# Patient Record
Sex: Female | Born: 1940 | Race: White | Hispanic: No | Smoking: Never smoker
Health system: Southern US, Community
[De-identification: ages and names within clinical notes are randomized; demographics above are authoritative.]

## PROBLEM LIST (undated history)

## (undated) DIAGNOSIS — M1990 Unspecified osteoarthritis, unspecified site: Secondary | ICD-10-CM

## (undated) DIAGNOSIS — H9190 Unspecified hearing loss, unspecified ear: Secondary | ICD-10-CM

## (undated) DIAGNOSIS — I350 Nonrheumatic aortic (valve) stenosis: Secondary | ICD-10-CM

## (undated) DIAGNOSIS — K219 Gastro-esophageal reflux disease without esophagitis: Secondary | ICD-10-CM

## (undated) DIAGNOSIS — I2510 Atherosclerotic heart disease of native coronary artery without angina pectoris: Secondary | ICD-10-CM

## (undated) DIAGNOSIS — E7800 Pure hypercholesterolemia, unspecified: Secondary | ICD-10-CM

## (undated) DIAGNOSIS — I10 Essential (primary) hypertension: Secondary | ICD-10-CM

## (undated) HISTORY — DX: Atherosclerotic heart disease of native coronary artery without angina pectoris: I25.10

## (undated) HISTORY — DX: Unspecified osteoarthritis, unspecified site: M19.90

## (undated) HISTORY — PX: BLADDER SURGERY: SHX569

## (undated) HISTORY — DX: Nonrheumatic aortic (valve) stenosis: I35.0

## (undated) HISTORY — DX: Gastro-esophageal reflux disease without esophagitis: K21.9

## (undated) HISTORY — DX: Unspecified hearing loss, unspecified ear: H91.90

## (undated) HISTORY — PX: CARDIAC SURGERY: SHX584

---

## 1997-08-27 ENCOUNTER — Other Ambulatory Visit: Admission: RE | Admit: 1997-08-27 | Discharge: 1997-08-27 | Payer: Self-pay | Admitting: Obstetrics & Gynecology

## 1998-03-19 ENCOUNTER — Emergency Department (HOSPITAL_COMMUNITY): Admission: EM | Admit: 1998-03-19 | Discharge: 1998-03-19 | Payer: Self-pay | Admitting: Emergency Medicine

## 1998-09-19 ENCOUNTER — Other Ambulatory Visit: Admission: RE | Admit: 1998-09-19 | Discharge: 1998-09-19 | Payer: Self-pay | Admitting: Family Medicine

## 1999-03-05 ENCOUNTER — Encounter: Admission: RE | Admit: 1999-03-05 | Discharge: 1999-03-05 | Payer: Self-pay | Admitting: Internal Medicine

## 1999-03-05 ENCOUNTER — Encounter: Payer: Self-pay | Admitting: Family Medicine

## 1999-03-12 ENCOUNTER — Encounter: Admission: RE | Admit: 1999-03-12 | Discharge: 1999-03-12 | Payer: Self-pay | Admitting: Family Medicine

## 1999-03-12 ENCOUNTER — Encounter: Payer: Self-pay | Admitting: Family Medicine

## 1999-09-10 ENCOUNTER — Encounter: Payer: Self-pay | Admitting: Family Medicine

## 1999-09-10 ENCOUNTER — Encounter: Admission: RE | Admit: 1999-09-10 | Discharge: 1999-09-10 | Payer: Self-pay | Admitting: Family Medicine

## 1999-10-01 ENCOUNTER — Emergency Department (HOSPITAL_COMMUNITY): Admission: EM | Admit: 1999-10-01 | Discharge: 1999-10-01 | Payer: Self-pay | Admitting: Emergency Medicine

## 2000-05-12 ENCOUNTER — Encounter: Admission: RE | Admit: 2000-05-12 | Discharge: 2000-05-12 | Payer: Self-pay | Admitting: Family Medicine

## 2000-05-12 ENCOUNTER — Encounter: Payer: Self-pay | Admitting: Family Medicine

## 2000-05-21 ENCOUNTER — Ambulatory Visit (HOSPITAL_BASED_OUTPATIENT_CLINIC_OR_DEPARTMENT_OTHER): Admission: RE | Admit: 2000-05-21 | Discharge: 2000-05-21 | Payer: Self-pay | Admitting: Surgery

## 2000-05-21 ENCOUNTER — Encounter (INDEPENDENT_AMBULATORY_CARE_PROVIDER_SITE_OTHER): Payer: Self-pay | Admitting: *Deleted

## 2000-09-17 ENCOUNTER — Other Ambulatory Visit: Admission: RE | Admit: 2000-09-17 | Discharge: 2000-09-17 | Payer: Self-pay | Admitting: Family Medicine

## 2000-12-03 ENCOUNTER — Encounter (INDEPENDENT_AMBULATORY_CARE_PROVIDER_SITE_OTHER): Payer: Self-pay | Admitting: *Deleted

## 2000-12-03 ENCOUNTER — Ambulatory Visit (HOSPITAL_COMMUNITY): Admission: RE | Admit: 2000-12-03 | Discharge: 2000-12-03 | Payer: Self-pay | Admitting: *Deleted

## 2001-06-27 ENCOUNTER — Encounter: Payer: Self-pay | Admitting: Family Medicine

## 2001-06-27 ENCOUNTER — Encounter: Admission: RE | Admit: 2001-06-27 | Discharge: 2001-06-27 | Payer: Self-pay | Admitting: Family Medicine

## 2001-11-24 ENCOUNTER — Ambulatory Visit (HOSPITAL_COMMUNITY): Admission: RE | Admit: 2001-11-24 | Discharge: 2001-11-24 | Payer: Self-pay | Admitting: Family Medicine

## 2001-11-24 ENCOUNTER — Encounter: Payer: Self-pay | Admitting: Family Medicine

## 2003-01-10 ENCOUNTER — Encounter: Admission: RE | Admit: 2003-01-10 | Discharge: 2003-01-10 | Payer: Self-pay | Admitting: Family Medicine

## 2003-01-10 ENCOUNTER — Encounter: Payer: Self-pay | Admitting: Family Medicine

## 2003-11-13 ENCOUNTER — Other Ambulatory Visit: Admission: RE | Admit: 2003-11-13 | Discharge: 2003-11-13 | Payer: Self-pay | Admitting: Family Medicine

## 2004-01-21 ENCOUNTER — Encounter: Admission: RE | Admit: 2004-01-21 | Discharge: 2004-01-21 | Payer: Self-pay | Admitting: Family Medicine

## 2004-07-16 ENCOUNTER — Encounter: Admission: RE | Admit: 2004-07-16 | Discharge: 2004-07-16 | Payer: Self-pay | Admitting: Family Medicine

## 2004-12-29 ENCOUNTER — Encounter: Admission: RE | Admit: 2004-12-29 | Discharge: 2004-12-29 | Payer: Self-pay | Admitting: *Deleted

## 2005-01-02 ENCOUNTER — Ambulatory Visit (HOSPITAL_COMMUNITY): Admission: RE | Admit: 2005-01-02 | Discharge: 2005-01-03 | Payer: Self-pay | Admitting: Interventional Cardiology

## 2005-11-25 ENCOUNTER — Encounter: Admission: RE | Admit: 2005-11-25 | Discharge: 2005-11-25 | Payer: Self-pay | Admitting: Family Medicine

## 2006-02-24 ENCOUNTER — Other Ambulatory Visit: Admission: RE | Admit: 2006-02-24 | Discharge: 2006-02-24 | Payer: Self-pay | Admitting: Family Medicine

## 2006-11-29 ENCOUNTER — Encounter: Admission: RE | Admit: 2006-11-29 | Discharge: 2006-11-29 | Payer: Self-pay | Admitting: Family Medicine

## 2007-12-26 ENCOUNTER — Encounter: Admission: RE | Admit: 2007-12-26 | Discharge: 2007-12-26 | Payer: Self-pay | Admitting: Family Medicine

## 2007-12-28 ENCOUNTER — Other Ambulatory Visit: Admission: RE | Admit: 2007-12-28 | Discharge: 2007-12-28 | Payer: Self-pay | Admitting: Family Medicine

## 2009-01-02 ENCOUNTER — Encounter: Admission: RE | Admit: 2009-01-02 | Discharge: 2009-01-02 | Payer: Self-pay | Admitting: Family Medicine

## 2009-01-23 ENCOUNTER — Other Ambulatory Visit: Admission: RE | Admit: 2009-01-23 | Discharge: 2009-01-23 | Payer: Self-pay | Admitting: Family Medicine

## 2010-01-22 ENCOUNTER — Encounter: Admission: RE | Admit: 2010-01-22 | Discharge: 2010-01-22 | Payer: Self-pay | Admitting: Family Medicine

## 2010-08-08 NOTE — Cardiovascular Report (Signed)
NAME:  Susan Marsh, Susan Marsh NO.:  [id number]   MEDICAL RECORD NO.:  [id number]          PATIENT TYPE:  OIB   LOCATION:  2856                         FACILITY:  [facility]   PHYSICIAN:  Lyn Records, M.D.   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   PROCEDURE:  Direct stent, proximal left anterior descending coronary artery,  with Cypher drug-eluting stent.   OPERATORS:  Lyn Records, M.D., Corky Crafts, M.D.   DESCRIPTION:  A 6-French sheath was placed in the right femoral artery using  the modified Seldinger technique.  A 6-French #4 left Judkins catheter was  used for left coronary artery intubation.  IV Angiomax was used as an  antithrombotic.  The patient did receive a total of 600 mg of Plavix in the  past 24 hours.   A BMW wire was used to cross beyond the tight stenosis in the proximal LAD.  Direct stenting with a 3.0 x 13 Cypher drug-eluting stent was then  performed.  Deployment was to 15 atmospheres and post dilatation was with a  PowerSail 3.25 x 8 mm balloon.  Two balloon inflations were performed in the  distal and proximal stent successively, starting distally and going  proximally.  TIMI grade 3 flow was noted.  Stenosis was felt to be  adequately dilated and the case was terminated.  She was not a candidate for  Angio-Seal.   CONCLUSION:  Successful percutaneous coronary intervention of the proximal  left anterior descending coronary artery with reduction in stenosis from 80  to 0% following Cypher drug-eluting stent implantation as described above.   PLAN:  Aspirin and Plavix x12 months.  Hopeful discharge January 03, 2005.      Lyn Records, M.D.  Electronically Signed     HWS/MEDQ  D:  01/02/2005  T:  01/03/2005  Job:   [id number]   cc:   Holley Bouche, M.D.  Fax: [phone]   Meade Maw, M.D.  Fax: [phone]

## 2010-08-08 NOTE — Op Note (Signed)
[place]  Patient:    Susan Marsh, Susan Marsh                      MRN: [id number] Proc. Date: 05/21/00 Adm. Date:  [id number] Attending:  Katha Cabal                           Operative Report  CCS [phone].  PREOPERATIVE INDICATIONS:  Susan Marsh has had a longstanding nipple discharge on the left side, recently becoming bloody.  She had duct cannulation, which appeared to identify a filling defect in the inferior aspect of the left breast.  Informed consent was obtained regarding removal.  SURGEON:  Thornton Park. Daphine Deutscher, M.D.  ANESTHESIA:  General by LMA.  DESCRIPTION OF PROCEDURE:  Susan Marsh was taken to room 6 on March 1 and given general anesthesia.  The nipple was first inspected and compressed and the uniductal discharge noted.  I passed a tiny little cannula into that duct after prepping the area widely with Betadine.  With the ductal probe in place, I then made a circumareolar incision around the inferior margin of the nipple and stayed along the areola up and then excised this duct with the underlying tissue.  I went out about at least 2-3 cm into the inferior aspect of the breast and removed the breast tissue.  There was not a single duct that I identified that was dark or showed blood within it.  This corresponds to the fact that I had not seen any blood from her nipple at all, as previously reported.  I, however, took a generous biopsy in the inferior aspect both medially and laterally and removed anything that felt palpable as well as the duct that I had cannulated.  Bleeding was controlled with figure-of-eight sutures of 4-0 Vicryl and the electrocautery.  The area was then infiltrated with 1% lidocaine and was closed with 4-0 Vicryl subcuticularly with benzoin and Steri-Strips.  A sterile dressing was applied.  The patient will be given Maxidone for pain and will be followed up in the office in about two  weeks.DD: 05/21/00 TD:  05/22/00 Job: [id number]

## 2011-01-23 ENCOUNTER — Other Ambulatory Visit: Payer: Self-pay | Admitting: Family Medicine

## 2011-01-23 DIAGNOSIS — Z1231 Encounter for screening mammogram for malignant neoplasm of breast: Secondary | ICD-10-CM

## 2011-01-28 ENCOUNTER — Ambulatory Visit
Admission: RE | Admit: 2011-01-28 | Discharge: 2011-01-28 | Disposition: A | Discharge status: Home / Self Care | Payer: Medicare HMO | Source: Ambulatory Visit | Attending: Family Medicine | Admitting: Family Medicine

## 2011-01-28 DIAGNOSIS — Z1231 Encounter for screening mammogram for malignant neoplasm of breast: Secondary | ICD-10-CM

## 2012-01-20 ENCOUNTER — Other Ambulatory Visit: Payer: Self-pay | Admitting: Family Medicine

## 2012-01-20 DIAGNOSIS — Z1231 Encounter for screening mammogram for malignant neoplasm of breast: Secondary | ICD-10-CM

## 2012-01-29 ENCOUNTER — Ambulatory Visit
Admission: RE | Admit: 2012-01-29 | Discharge: 2012-01-29 | Disposition: A | Discharge status: Home / Self Care | Payer: Medicare HMO | Source: Ambulatory Visit | Attending: Family Medicine | Admitting: Family Medicine

## 2012-01-29 DIAGNOSIS — Z1231 Encounter for screening mammogram for malignant neoplasm of breast: Secondary | ICD-10-CM

## 2012-02-05 ENCOUNTER — Other Ambulatory Visit: Payer: Self-pay | Admitting: Family Medicine

## 2012-02-05 DIAGNOSIS — Z780 Asymptomatic menopausal state: Secondary | ICD-10-CM

## 2012-02-12 ENCOUNTER — Ambulatory Visit
Admission: RE | Admit: 2012-02-12 | Discharge: 2012-02-12 | Disposition: A | Discharge status: Home / Self Care | Payer: Medicare HMO | Source: Ambulatory Visit | Attending: Family Medicine | Admitting: Family Medicine

## 2012-02-12 DIAGNOSIS — Z780 Asymptomatic menopausal state: Secondary | ICD-10-CM

## 2013-01-18 ENCOUNTER — Other Ambulatory Visit: Payer: Self-pay

## 2013-01-18 DIAGNOSIS — Z1231 Encounter for screening mammogram for malignant neoplasm of breast: Secondary | ICD-10-CM

## 2013-02-22 ENCOUNTER — Ambulatory Visit: Payer: Medicare HMO

## 2013-03-01 ENCOUNTER — Ambulatory Visit
Admission: RE | Admit: 2013-03-01 | Discharge: 2013-03-01 | Disposition: A | Discharge status: Home / Self Care | Payer: Medicare PPO | Source: Ambulatory Visit

## 2013-03-01 DIAGNOSIS — Z1231 Encounter for screening mammogram for malignant neoplasm of breast: Secondary | ICD-10-CM

## 2013-08-23 ENCOUNTER — Ambulatory Visit
Admission: RE | Admit: 2013-08-23 | Discharge: 2013-08-23 | Disposition: A | Discharge status: Home / Self Care | Payer: Medicare PPO | Source: Ambulatory Visit | Attending: Family Medicine | Admitting: Family Medicine

## 2013-08-23 ENCOUNTER — Other Ambulatory Visit: Payer: Self-pay | Admitting: Family Medicine

## 2013-08-23 DIAGNOSIS — N644 Mastodynia: Secondary | ICD-10-CM

## 2014-02-07 ENCOUNTER — Other Ambulatory Visit: Payer: Self-pay

## 2014-02-07 DIAGNOSIS — Z1231 Encounter for screening mammogram for malignant neoplasm of breast: Secondary | ICD-10-CM

## 2014-03-02 ENCOUNTER — Encounter (INDEPENDENT_AMBULATORY_CARE_PROVIDER_SITE_OTHER): Payer: Self-pay

## 2014-03-02 ENCOUNTER — Ambulatory Visit
Admission: RE | Admit: 2014-03-02 | Discharge: 2014-03-02 | Disposition: A | Discharge status: Home / Self Care | Payer: Commercial Managed Care - HMO | Source: Ambulatory Visit

## 2014-03-02 DIAGNOSIS — Z1231 Encounter for screening mammogram for malignant neoplasm of breast: Secondary | ICD-10-CM

## 2014-05-25 DIAGNOSIS — K644 Residual hemorrhoidal skin tags: Secondary | ICD-10-CM | POA: Diagnosis not present

## 2014-05-25 DIAGNOSIS — K641 Second degree hemorrhoids: Secondary | ICD-10-CM | POA: Diagnosis not present

## 2014-05-25 DIAGNOSIS — Z1211 Encounter for screening for malignant neoplasm of colon: Secondary | ICD-10-CM | POA: Diagnosis not present

## 2014-05-25 DIAGNOSIS — Z800 Family history of malignant neoplasm of digestive organs: Secondary | ICD-10-CM | POA: Diagnosis not present

## 2014-05-25 DIAGNOSIS — K5730 Diverticulosis of large intestine without perforation or abscess without bleeding: Secondary | ICD-10-CM | POA: Diagnosis not present

## 2014-09-14 DIAGNOSIS — E780 Pure hypercholesterolemia: Secondary | ICD-10-CM | POA: Diagnosis not present

## 2014-09-14 DIAGNOSIS — M1990 Unspecified osteoarthritis, unspecified site: Secondary | ICD-10-CM | POA: Diagnosis not present

## 2014-09-14 DIAGNOSIS — I10 Essential (primary) hypertension: Secondary | ICD-10-CM | POA: Diagnosis not present

## 2015-01-30 ENCOUNTER — Other Ambulatory Visit: Payer: Self-pay

## 2015-01-30 DIAGNOSIS — Z1231 Encounter for screening mammogram for malignant neoplasm of breast: Secondary | ICD-10-CM

## 2015-02-14 DIAGNOSIS — K219 Gastro-esophageal reflux disease without esophagitis: Secondary | ICD-10-CM | POA: Diagnosis not present

## 2015-02-14 DIAGNOSIS — R1084 Generalized abdominal pain: Secondary | ICD-10-CM | POA: Diagnosis not present

## 2015-02-14 DIAGNOSIS — I10 Essential (primary) hypertension: Secondary | ICD-10-CM | POA: Diagnosis not present

## 2015-02-16 DIAGNOSIS — K219 Gastro-esophageal reflux disease without esophagitis: Secondary | ICD-10-CM | POA: Diagnosis not present

## 2015-02-16 DIAGNOSIS — R450 Nervousness: Secondary | ICD-10-CM | POA: Diagnosis not present

## 2015-02-18 ENCOUNTER — Encounter (HOSPITAL_COMMUNITY): Payer: Self-pay | Admitting: Family Medicine

## 2015-02-18 ENCOUNTER — Emergency Department (HOSPITAL_COMMUNITY)
Admission: EM | Admit: 2015-02-18 | Discharge: 2015-02-19 | Disposition: A | Discharge status: Home / Self Care | Payer: Commercial Managed Care - HMO | Attending: Emergency Medicine | Admitting: Emergency Medicine

## 2015-02-18 ENCOUNTER — Emergency Department (HOSPITAL_COMMUNITY): Payer: Commercial Managed Care - HMO

## 2015-02-18 DIAGNOSIS — Z791 Long term (current) use of non-steroidal anti-inflammatories (NSAID): Secondary | ICD-10-CM | POA: Diagnosis not present

## 2015-02-18 DIAGNOSIS — Z7982 Long term (current) use of aspirin: Secondary | ICD-10-CM | POA: Diagnosis not present

## 2015-02-18 DIAGNOSIS — R079 Chest pain, unspecified: Secondary | ICD-10-CM | POA: Diagnosis present

## 2015-02-18 DIAGNOSIS — Z79899 Other long term (current) drug therapy: Secondary | ICD-10-CM | POA: Diagnosis not present

## 2015-02-18 DIAGNOSIS — E7800 Pure hypercholesterolemia, unspecified: Secondary | ICD-10-CM | POA: Insufficient documentation

## 2015-02-18 DIAGNOSIS — R0789 Other chest pain: Secondary | ICD-10-CM | POA: Diagnosis not present

## 2015-02-18 DIAGNOSIS — K209 Esophagitis, unspecified without bleeding: Secondary | ICD-10-CM

## 2015-02-18 DIAGNOSIS — I10 Essential (primary) hypertension: Secondary | ICD-10-CM | POA: Insufficient documentation

## 2015-02-18 HISTORY — DX: Essential (primary) hypertension: I10

## 2015-02-18 HISTORY — DX: Pure hypercholesterolemia, unspecified: E78.00

## 2015-02-18 LAB — COMPREHENSIVE METABOLIC PANEL
ALBUMIN: 4.7 g/dL (ref 3.5–5.0)
ALT: 35 U/L (ref 14–54)
AST: 43 U/L — AB (ref 15–41)
Alkaline Phosphatase: 66 U/L (ref 38–126)
Anion gap: 12 (ref 5–15)
BUN: 12 mg/dL (ref 6–20)
CHLORIDE: 90 mmol/L — AB (ref 101–111)
CO2: 26 mmol/L (ref 22–32)
Calcium: 9.7 mg/dL (ref 8.9–10.3)
Creatinine, Ser: 0.66 mg/dL (ref 0.44–1.00)
GFR calc Af Amer: >60 mL/min (ref >60)
Glucose, Bld: 104 mg/dL — ABNORMAL HIGH (ref 65–99)
POTASSIUM: 2.9 mmol/L — AB (ref 3.5–5.1)
SODIUM: 128 mmol/L — AB (ref 135–145)
Total Bilirubin: 0.6 mg/dL (ref 0.3–1.2)
Total Protein: 8.3 g/dL — ABNORMAL HIGH (ref 6.5–8.1)

## 2015-02-18 LAB — URINE MICROSCOPIC-ADD ON: BACTERIA UA: NONE SEEN

## 2015-02-18 LAB — URINALYSIS, ROUTINE W REFLEX MICROSCOPIC
Bilirubin Urine: NEGATIVE
GLUCOSE, UA: NEGATIVE mg/dL
HGB URINE DIPSTICK: NEGATIVE
KETONES UR: NEGATIVE mg/dL
Nitrite: NEGATIVE
PH: 7 (ref 5.0–8.0)
Protein, ur: NEGATIVE mg/dL
Specific Gravity, Urine: 1.012 (ref 1.005–1.030)

## 2015-02-18 LAB — CBC
HEMATOCRIT: 40.8 % (ref 36.0–46.0)
Hemoglobin: 14.7 g/dL (ref 12.0–15.0)
MCH: 30.9 pg (ref 26.0–34.0)
MCHC: 36 g/dL (ref 30.0–36.0)
MCV: 85.9 fL (ref 78.0–100.0)
Platelets: 256 10*3/uL (ref 150–400)
RBC: 4.75 MIL/uL (ref 3.87–5.11)
RDW: 12.3 % (ref 11.5–15.5)
WBC: 6.8 10*3/uL (ref 4.0–10.5)

## 2015-02-18 LAB — I-STAT TROPONIN, ED: Troponin i, poc: 0.01 ng/mL (ref 0.00–0.08)

## 2015-02-18 LAB — LIPASE, BLOOD: LIPASE: 57 U/L — AB (ref 11–51)

## 2015-02-18 MED ORDER — POTASSIUM CHLORIDE CRYS ER 20 MEQ PO TBCR
40.0000 meq | EXTENDED_RELEASE_TABLET | Freq: Once | ORAL | Status: AC
  Administered 2015-02-18: 40 meq via ORAL
  Filled 2015-02-18: qty 2

## 2015-02-18 MED ORDER — SODIUM CHLORIDE 0.9 % IV BOLUS (SEPSIS)
500.0000 mL | Freq: Once | INTRAVENOUS | Status: AC
  Administered 2015-02-18: 500 mL via INTRAVENOUS

## 2015-02-18 MED ORDER — SODIUM CHLORIDE 0.9 % IV BOLUS (SEPSIS): 1000.0000 mL | Freq: Once | INTRAVENOUS | Status: DC

## 2015-02-18 NOTE — ED Notes (Signed)
Pt here for chest pain and the sensation of something stuck in esophagus. Pt has lost 10 pounds and unable to hold anything down.

## 2015-02-18 NOTE — ED Provider Notes (Signed)
CSN: [id number]     Arrival date & time 02/18/15  1757 History   First MD Initiated Contact with Patient 02/18/15 2005     Chief Complaint  Patient presents with  . Chest Pain  . Emesis   HPI  Susan Marsh is a 74 y.o. F PMH significant for HTN, HLD presenting with a two week history of chest pain and sensation of something being stuck in her esophagus. She is from out of town, staying with family for Thanksgiving, and was seen at an urgent care in [place] for the same complaint two days ago and was started on ranitidine. She describes her pain as 3/10 pain scale, discomfort, burning, associated with foods, non-radiating, lower esophagus. She endorses a 10 lb weight loss over the 2 weeks because the pain bothers her and she spits the food back up. She denies fevers, chills, SOB, palpitations, abdominal pain, changes in bowel/bladder habits.  Past Medical History  Diagnosis Date  . Hypertension   . High cholesterol    Past Surgical History  Procedure Laterality Date  . Cardiac surgery     History reviewed. No pertinent family history. Social History  Substance Use Topics  . Smoking status: Never Smoker   . Smokeless tobacco: None  . Alcohol Use: None   OB History    No data available     Review of Systems  Ten systems are reviewed and are negative for acute change except as noted in the HPI  Allergies  Review of patient's allergies indicates no known allergies.  Home Medications   Prior to Admission medications   Medication Sig Start Date End Date Taking? Authorizing Provider  amLODipine (NORVASC) 10 MG tablet Take 10 mg by mouth daily. 02/10/15  Yes Historical Provider, MD  aspirin 81 MG chewable tablet Chew 81 mg by mouth daily.   Yes Historical Provider, MD  hydrochlorothiazide (HYDRODIURIL) 25 MG tablet Take 25 mg by mouth daily. 02/10/15  Yes Historical Provider, MD  lisinopril (PRINIVIL,ZESTRIL) 40 MG tablet Take 40 mg by mouth daily. 02/10/15  Yes Historical  Provider, MD  LORazepam (ATIVAN) 0.5 MG tablet Take 0.5 mg by mouth 2 (two) times daily as needed. nervousness 02/16/15  Yes Historical Provider, MD  meloxicam (MOBIC) 15 MG tablet Take 15 mg by mouth daily. 02/10/15  Yes Historical Provider, MD  metoprolol (LOPRESSOR) 50 MG tablet Take 50 mg by mouth 2 (two) times daily. 02/10/15  Yes Historical Provider, MD  pravastatin (PRAVACHOL) 40 MG tablet Take 40 mg by mouth daily. 02/10/15  Yes Historical Provider, MD  ranitidine (ZANTAC) 150 MG tablet Take 150 mg by mouth 2 (two) times daily as needed. reflux 02/16/15  Yes Historical Provider, MD   BP 128/62 mmHg  Pulse 57  Temp(Src) 98.3 F (36.8 C) (Oral)  Resp 15  Wt 68.493 kg  SpO2 96% Physical Exam  Constitutional: She appears well-developed and well-nourished. No distress.  HENT:  Head: Normocephalic and atraumatic.  Mouth/Throat: Oropharynx is clear and moist. No oropharyngeal exudate.  Eyes: Conjunctivae are normal. Pupils are equal, round, and reactive to light. Right eye exhibits no discharge. Left eye exhibits no discharge. No scleral icterus.  Neck: No tracheal deviation present.  Cardiovascular: Normal rate, regular rhythm, normal heart sounds and intact distal pulses.  Exam reveals no gallop and no friction rub.   No murmur heard. Pulmonary/Chest: Effort normal and breath sounds normal. No respiratory distress. She has no wheezes. She has no rales. She exhibits no tenderness.  Abdominal:  Soft. Bowel sounds are normal. She exhibits no distension and no mass. There is no tenderness. There is no rebound and no guarding.  Musculoskeletal: She exhibits no edema.  Lymphadenopathy:    She has no cervical adenopathy.  Neurological: She is alert. Coordination normal.  Skin: Skin is warm and dry. No rash noted. She is not diaphoretic. No erythema.  Psychiatric: She has a normal mood and affect. Her behavior is normal.  Nursing note and vitals reviewed.   ED Course  Procedures  Labs  Review Labs Reviewed  LIPASE, BLOOD - Abnormal; Notable for the following:    Lipase 57 (*)    All other components within normal limits  COMPREHENSIVE METABOLIC PANEL - Abnormal; Notable for the following:    Sodium 128 (*)    Potassium 2.9 (*)    Chloride 90 (*)    Glucose, Bld 104 (*)    Total Protein 8.3 (*)    AST 43 (*)    All other components within normal limits  URINALYSIS, ROUTINE W REFLEX MICROSCOPIC (NOT AT [facility]) - Abnormal; Notable for the following:    Leukocytes, UA SMALL (*)    All other components within normal limits  URINE MICROSCOPIC-ADD ON - Abnormal; Notable for the following:    Squamous Epithelial / LPF 0-5 (*)    All other components within normal limits  URINE CULTURE  CBC  I-STAT TROPOININ, ED  Rosezena Sensor, ED    Imaging Review Dg Chest 2 View  02/18/2015  CLINICAL DATA:  Initial evaluation for acute chest pain. EXAM: CHEST  2 VIEW COMPARISON:  Prior study from 08/23/2013. FINDINGS: The cardiac and mediastinal silhouettes are stable in size and contour, and remain within normal limits. The lungs are normally inflated. No airspace consolidation, pleural effusion, or pulmonary edema is identified. There is no pneumothorax. No acute osseous abnormality identified. IMPRESSION: No active cardiopulmonary disease. Electronically Signed   By: Rise Mu M.D.   On: 02/18/2015 21:33   I have personally reviewed and evaluated these images and lab results as part of my medical decision-making.   EKG Interpretation   Date/Time:  Tuesday February 19 2015 01:23:44 EST Ventricular Rate:  64 PR Interval:  184 QRS Duration: 123 QT Interval:  478 QTC Calculation: 493 R Axis:   -47 Text Interpretation:  Sinus rhythm Nonspecific IVCD with LAD Left  ventricular hypertrophy Anterior Q waves, possibly due to LVH No  significant change since last tracing Confirmed by NGUYEN, EMILY ([id number])  on 02/19/2015 1:28:05 AM      MDM   Final diagnoses:   Esophagitis   Patient non-toxic appearing and VSS. Her chest pain seems more GERD related vs ACS but will get troponin and EKG.   Sodium 128, K 2.9. Will give fluids and potassium supplement. Troponin, EKG, CBC unremarkable.  Patient requesting to go home. She tolerates PO.  UA demonstrates small leukocytes and 0-5 squamous. Dr. Cyndie Chime advised no treatment due to lack of urinary symptoms.  Patient may be safely discharged home. Discussed reasons for return. Advised her to stay on Zantac. Patient to follow-up with primary care provider within one week. Advised follow-up with GI if symptoms persist. Patient in understanding and agreement with the plan.   Melton Krebs, PA-C 03/01/15 1324  Leta Baptist, MD 03/08/15 [phone]

## 2015-02-19 DIAGNOSIS — E7800 Pure hypercholesterolemia, unspecified: Secondary | ICD-10-CM | POA: Diagnosis not present

## 2015-02-19 DIAGNOSIS — I10 Essential (primary) hypertension: Secondary | ICD-10-CM | POA: Diagnosis not present

## 2015-02-19 DIAGNOSIS — Z791 Long term (current) use of non-steroidal anti-inflammatories (NSAID): Secondary | ICD-10-CM | POA: Diagnosis not present

## 2015-02-19 DIAGNOSIS — K209 Esophagitis, unspecified: Secondary | ICD-10-CM | POA: Diagnosis not present

## 2015-02-19 DIAGNOSIS — Z7982 Long term (current) use of aspirin: Secondary | ICD-10-CM | POA: Diagnosis not present

## 2015-02-19 DIAGNOSIS — Z79899 Other long term (current) drug therapy: Secondary | ICD-10-CM | POA: Diagnosis not present

## 2015-02-19 LAB — I-STAT TROPONIN, ED: Troponin i, poc: 0.02 ng/mL (ref 0.00–0.08)

## 2015-02-19 NOTE — Discharge Instructions (Signed)
Susan Marsh, [place] meeting you! Please follow-up with gastroenterology and cardiology. Return to the emergency department if you develop fevers, are unable to keep food down, become sweaty, or your chest pain worsens. Feel better soon!  S. Lane Hacker, PA-C   Esophagitis Esophagitis is inflammation of the esophagus. The esophagus is the tube that carries food and liquids from your mouth to your stomach. Esophagitis can cause soreness or pain in the esophagus. This condition can make it difficult and painful to swallow.  CAUSES Most causes of esophagitis are not serious. Common causes of this condition include:  Gastroesophageal reflux disease (GERD). This is when stomach contents move back up into the esophagus (reflux).  Repeated vomiting.  An allergic-type reaction, especially caused by food allergies (eosinophilic esophagitis).  Injury to the esophagus by swallowing large pills with or without water, or swallowing certain types of medicines.  Swallowing (ingesting) harmful chemicals, such as household cleaning products.  Heavy alcohol use.  An infection of the esophagus.This most often occurs in people who have a weakened immune system.  Radiation or chemotherapy treatment for cancer.  Certain diseases such as sarcoidosis, Crohn disease, and scleroderma. SYMPTOMS Symptoms of this condition include:  Difficult or painful swallowing.  Pain with swallowing acidic liquids, such as citrus juices.  Pain with burping.  Chest pain.  Difficulty breathing.  Nausea.  Vomiting.  Pain in the abdomen.  Weight loss.  Ulcers in the mouth.  Patches of white material in the mouth (candidiasis).  Fever.  Coughing up blood or vomiting blood.  Stool that is black, tarry, or bright red. DIAGNOSIS Your health care provider will take a medical history and perform a physical exam. You may also have other tests, including:  An endoscopy to examine your stomach and esophagus  with a small camera.  A test that measures the acidity level in your esophagus.  A test that measures how much pressure is on your esophagus.  A barium swallow or modified barium swallow to show the shape, size, and functioning of your esophagus.  Allergy tests. TREATMENT Treatment for this condition depends on the cause of your esophagitis. In some cases, steroids or other medicines may be given to help relieve your symptoms or to treat the underlying cause of your condition. You may have to make some lifestyle changes, such as:  Avoiding alcohol.  Quitting smoking.  Changing your diet.  Exercising.  Changing your sleep habits and your sleep environment. HOME CARE INSTRUCTIONS Take these actions to decrease your discomfort and to help avoid complications. Diet  Follow a diet as recommended by your health care provider. This may involve avoiding foods and drinks such as:  Coffee and tea (with or without caffeine).  Drinks that contain alcohol.  Energy drinks and sports drinks.  Carbonated drinks or sodas.  Chocolate and cocoa.  Peppermint and mint flavorings.  Garlic and onions.  Horseradish.  Spicy and acidic foods, including peppers, chili powder, curry powder, vinegar, hot sauces, and barbecue sauce.  Citrus fruit juices and citrus fruits, such as oranges, lemons, and limes.  Tomato-based foods, such as red sauce, chili, salsa, and pizza with red sauce.  Fried and fatty foods, such as donuts, french fries, potato chips, and high-fat dressings.  High-fat meats, such as hot dogs and fatty cuts of red and white meats, such as rib eye steak, sausage, ham, and bacon.  High-fat dairy items, such as whole milk, butter, and cream cheese.  Eat small, frequent meals instead of large meals.  Avoid drinking large amounts of liquid with your meals.  Avoid eating meals during the 2-3 hours before bedtime.  Avoid lying down right after you eat.  Do not exercise  right after you eat.  Avoid foods and drinks that seem to make your symptoms worse. General Instructions  Pay attention to any changes in your symptoms.  Take over-the-counter and prescription medicines only as told by your health care provider. Do not take aspirin, ibuprofen, or other NSAIDs unless your health care provider told you to do so.  If you have trouble taking pills, use a pill splitter to decrease the size of the pill. This will decrease the chance of the pill getting stuck or injuring your esophagus on the way down. Also, drink water after you take a pill.  Do not use any tobacco products, including cigarettes, chewing tobacco, and e-cigarettes. If you need help quitting, ask your health care provider.  Wear loose-fitting clothing. Do not wear anything tight around your waist that causes pressure on your abdomen.  Raise (elevate) the head of your bed about 6 inches (15 cm).  Try to reduce your stress, such as with yoga or meditation. If you need help reducing stress, ask your health care provider.  If you are overweight, reduce your weight to an amount that is healthy for you. Ask your health care provider for guidance about a safe weight loss goal.  Keep all follow-up visits as told by your health care provider. This is important. SEEK MEDICAL CARE IF:  You have new symptoms.  You have unexplained weight loss.  You have difficulty swallowing, or it hurts to swallow.  You have wheezing or a persistent cough.  Your symptoms do not improve with treatment.  You have frequent heartburn for more than two weeks. SEEK IMMEDIATE MEDICAL CARE IF:  You have severe pain in your arms, neck, jaw, teeth, or back.  You feel sweaty, dizzy, or light-headed.  You have chest pain or shortness of breath.  You vomit and your vomit looks like blood or coffee grounds.  Your stool is bloody or black.  You have a fever.  You cannot swallow, drink, or eat.   This information is  not intended to replace advice given to you by your health care provider. Make sure you discuss any questions you have with your health care provider.   Document Released: 04/16/2004 Document Revised: 11/28/2014 Document Reviewed: 07/04/2014 Elsevier Interactive Patient Education [employer]2016 [employer] Inc.

## 2015-02-19 NOTE — ED Notes (Signed)
[facility] and that the pts urine results need to be reviewed first.

## 2015-02-20 LAB — URINE CULTURE

## 2015-02-25 DIAGNOSIS — F419 Anxiety disorder, unspecified: Secondary | ICD-10-CM | POA: Diagnosis not present

## 2015-02-25 DIAGNOSIS — K210 Gastro-esophageal reflux disease with esophagitis: Secondary | ICD-10-CM | POA: Diagnosis not present

## 2015-02-25 DIAGNOSIS — J0100 Acute maxillary sinusitis, unspecified: Secondary | ICD-10-CM | POA: Diagnosis not present

## 2015-03-06 DIAGNOSIS — I10 Essential (primary) hypertension: Secondary | ICD-10-CM | POA: Diagnosis not present

## 2015-03-06 DIAGNOSIS — R1314 Dysphagia, pharyngoesophageal phase: Secondary | ICD-10-CM | POA: Diagnosis not present

## 2015-03-06 DIAGNOSIS — M1990 Unspecified osteoarthritis, unspecified site: Secondary | ICD-10-CM | POA: Diagnosis not present

## 2015-03-06 DIAGNOSIS — K210 Gastro-esophageal reflux disease with esophagitis: Secondary | ICD-10-CM | POA: Diagnosis not present

## 2015-03-06 DIAGNOSIS — E7800 Pure hypercholesterolemia, unspecified: Secondary | ICD-10-CM | POA: Diagnosis not present

## 2015-03-07 ENCOUNTER — Other Ambulatory Visit: Payer: Self-pay | Admitting: Family Medicine

## 2015-03-07 DIAGNOSIS — R1314 Dysphagia, pharyngoesophageal phase: Secondary | ICD-10-CM

## 2015-03-08 ENCOUNTER — Ambulatory Visit
Admission: RE | Admit: 2015-03-08 | Discharge: 2015-03-08 | Disposition: A | Discharge status: Home / Self Care | Payer: Commercial Managed Care - HMO | Source: Ambulatory Visit

## 2015-03-08 DIAGNOSIS — Z1231 Encounter for screening mammogram for malignant neoplasm of breast: Secondary | ICD-10-CM | POA: Diagnosis not present

## 2015-03-13 ENCOUNTER — Ambulatory Visit
Admission: RE | Admit: 2015-03-13 | Discharge: 2015-03-13 | Disposition: A | Discharge status: Home / Self Care | Payer: Commercial Managed Care - HMO | Source: Ambulatory Visit | Attending: Family Medicine | Admitting: Family Medicine

## 2015-03-13 ENCOUNTER — Other Ambulatory Visit: Payer: Self-pay | Admitting: Family Medicine

## 2015-03-13 DIAGNOSIS — R1310 Dysphagia, unspecified: Secondary | ICD-10-CM | POA: Diagnosis not present

## 2015-03-13 DIAGNOSIS — R1314 Dysphagia, pharyngoesophageal phase: Secondary | ICD-10-CM

## 2015-04-11 DIAGNOSIS — R319 Hematuria, unspecified: Secondary | ICD-10-CM | POA: Diagnosis not present

## 2015-04-11 DIAGNOSIS — N3000 Acute cystitis without hematuria: Secondary | ICD-10-CM | POA: Diagnosis not present

## 2015-04-11 DIAGNOSIS — K5900 Constipation, unspecified: Secondary | ICD-10-CM | POA: Diagnosis not present

## 2015-04-19 DIAGNOSIS — R1310 Dysphagia, unspecified: Secondary | ICD-10-CM | POA: Diagnosis not present

## 2015-04-19 DIAGNOSIS — K219 Gastro-esophageal reflux disease without esophagitis: Secondary | ICD-10-CM | POA: Diagnosis not present

## 2015-04-24 DIAGNOSIS — J209 Acute bronchitis, unspecified: Secondary | ICD-10-CM | POA: Diagnosis not present

## 2015-04-24 DIAGNOSIS — H60331 Swimmer's ear, right ear: Secondary | ICD-10-CM | POA: Diagnosis not present

## 2015-04-24 DIAGNOSIS — F419 Anxiety disorder, unspecified: Secondary | ICD-10-CM | POA: Diagnosis not present

## 2015-05-16 DIAGNOSIS — K219 Gastro-esophageal reflux disease without esophagitis: Secondary | ICD-10-CM | POA: Diagnosis not present

## 2015-05-16 DIAGNOSIS — R1310 Dysphagia, unspecified: Secondary | ICD-10-CM | POA: Diagnosis not present

## 2015-05-16 DIAGNOSIS — K3189 Other diseases of stomach and duodenum: Secondary | ICD-10-CM | POA: Diagnosis not present

## 2015-05-24 DIAGNOSIS — R011 Cardiac murmur, unspecified: Secondary | ICD-10-CM | POA: Diagnosis not present

## 2015-05-24 DIAGNOSIS — I10 Essential (primary) hypertension: Secondary | ICD-10-CM | POA: Diagnosis not present

## 2015-05-24 DIAGNOSIS — K210 Gastro-esophageal reflux disease with esophagitis: Secondary | ICD-10-CM | POA: Diagnosis not present

## 2015-05-24 DIAGNOSIS — F419 Anxiety disorder, unspecified: Secondary | ICD-10-CM | POA: Diagnosis not present

## 2015-11-27 DIAGNOSIS — I10 Essential (primary) hypertension: Secondary | ICD-10-CM | POA: Diagnosis not present

## 2015-11-27 DIAGNOSIS — M546 Pain in thoracic spine: Secondary | ICD-10-CM | POA: Diagnosis not present

## 2015-11-27 DIAGNOSIS — K210 Gastro-esophageal reflux disease with esophagitis: Secondary | ICD-10-CM | POA: Diagnosis not present

## 2015-11-27 DIAGNOSIS — N390 Urinary tract infection, site not specified: Secondary | ICD-10-CM | POA: Diagnosis not present

## 2015-11-27 DIAGNOSIS — E7800 Pure hypercholesterolemia, unspecified: Secondary | ICD-10-CM | POA: Diagnosis not present

## 2015-11-27 DIAGNOSIS — F419 Anxiety disorder, unspecified: Secondary | ICD-10-CM | POA: Diagnosis not present

## 2016-01-01 DIAGNOSIS — E876 Hypokalemia: Secondary | ICD-10-CM | POA: Diagnosis not present

## 2016-04-22 ENCOUNTER — Other Ambulatory Visit: Payer: Self-pay | Admitting: Family Medicine

## 2016-04-22 DIAGNOSIS — Z1231 Encounter for screening mammogram for malignant neoplasm of breast: Secondary | ICD-10-CM

## 2016-05-22 ENCOUNTER — Ambulatory Visit
Admission: RE | Admit: 2016-05-22 | Discharge: 2016-05-22 | Disposition: A | Discharge status: Home / Self Care | Payer: Medicare HMO | Source: Ambulatory Visit | Attending: Family Medicine | Admitting: Family Medicine

## 2016-05-22 DIAGNOSIS — Z1231 Encounter for screening mammogram for malignant neoplasm of breast: Secondary | ICD-10-CM

## 2016-05-27 DIAGNOSIS — M1990 Unspecified osteoarthritis, unspecified site: Secondary | ICD-10-CM | POA: Diagnosis not present

## 2016-05-27 DIAGNOSIS — M549 Dorsalgia, unspecified: Secondary | ICD-10-CM | POA: Diagnosis not present

## 2016-05-27 DIAGNOSIS — I10 Essential (primary) hypertension: Secondary | ICD-10-CM | POA: Diagnosis not present

## 2016-05-27 DIAGNOSIS — E7800 Pure hypercholesterolemia, unspecified: Secondary | ICD-10-CM | POA: Diagnosis not present

## 2016-05-27 DIAGNOSIS — I2510 Atherosclerotic heart disease of native coronary artery without angina pectoris: Secondary | ICD-10-CM | POA: Diagnosis not present

## 2016-07-03 DIAGNOSIS — H524 Presbyopia: Secondary | ICD-10-CM | POA: Diagnosis not present

## 2016-07-03 DIAGNOSIS — Z0100 Encounter for examination of eyes and vision without abnormal findings: Secondary | ICD-10-CM | POA: Diagnosis not present

## 2016-08-12 DIAGNOSIS — R110 Nausea: Secondary | ICD-10-CM | POA: Diagnosis not present

## 2016-08-12 DIAGNOSIS — F320 Major depressive disorder, single episode, mild: Secondary | ICD-10-CM | POA: Diagnosis not present

## 2016-08-12 DIAGNOSIS — F419 Anxiety disorder, unspecified: Secondary | ICD-10-CM | POA: Diagnosis not present

## 2016-08-12 DIAGNOSIS — I10 Essential (primary) hypertension: Secondary | ICD-10-CM | POA: Diagnosis not present

## 2016-09-11 IMAGING — RF DG UGI W/ KUB
17 of 22 series · 18 of 24 positions shown · non-contrast
Comparison: None.

CLINICAL DATA: Pharyngeal esophageal dysphagia

EXAM:
UPPER GI SERIES WITH KUB
TECHNIQUE: After obtaining a scout radiograph a routine upper GI series was
performed using thin barium
FLUOROSCOPY TIME:  Radiation Exposure Index (as provided by the
fluoroscopic device):
If the device does not provide the exposure index:
Fluoroscopy Time (in minutes and seconds):  2 minutes 24 seconds
Number of Acquired Images:

[Series 1: run · 2 of 26 slices shown (1 of 16)]
[im 1/26]
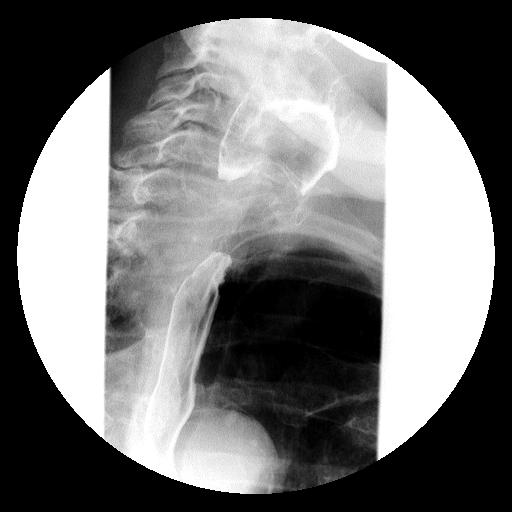
[im 26/26]
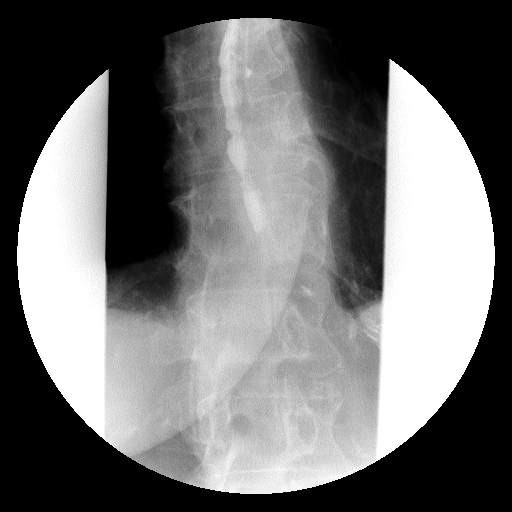

[Series 2: run · 1 of 1 slices shown (2 of 16)]
[im 1/1]
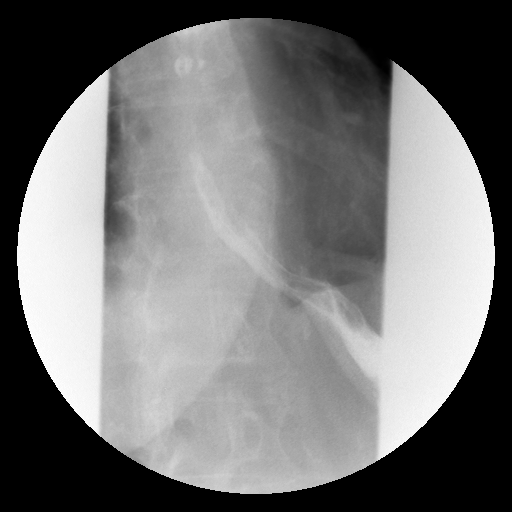

[Series 3: run · 1 of 1 slices shown (3 of 16)]
[im 1/1]
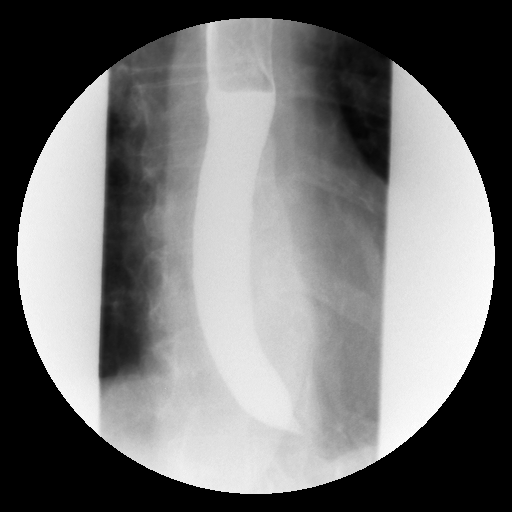

[Series 5: run · 1 of 1 slices shown (4 of 16)]
[im 1/1]
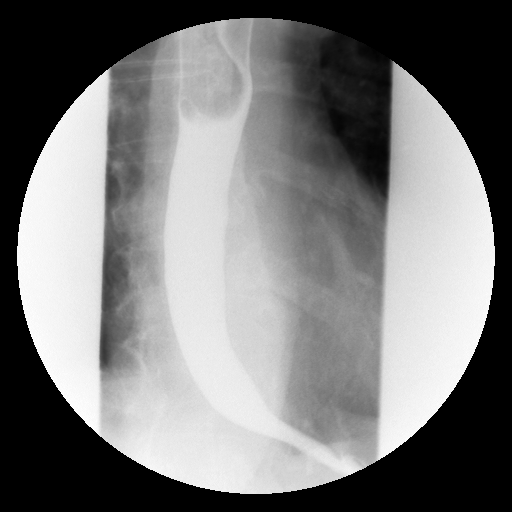

[Series 6: run · 1 of 1 slices shown (5 of 16)]
[im 1/1]
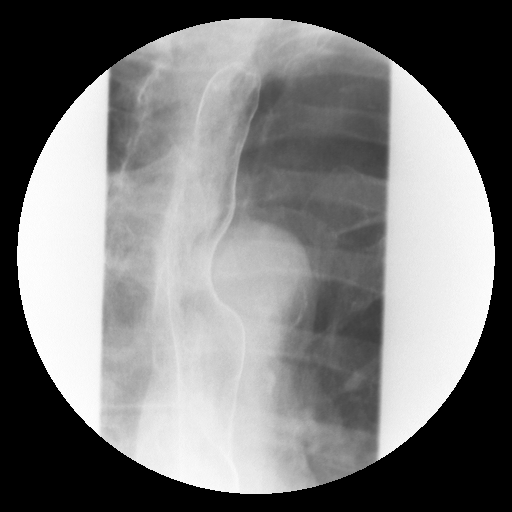

[Series 7: run · 1 of 1 slices shown (6 of 16)]
[im 1/1]
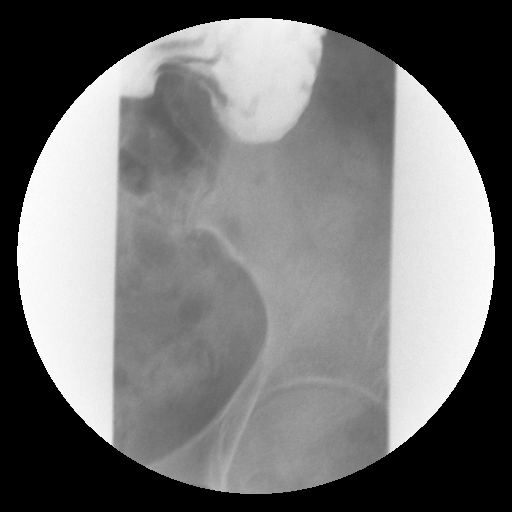

[Series 9: run · 1 of 1 slices shown (7 of 16)]
[im 1/1]
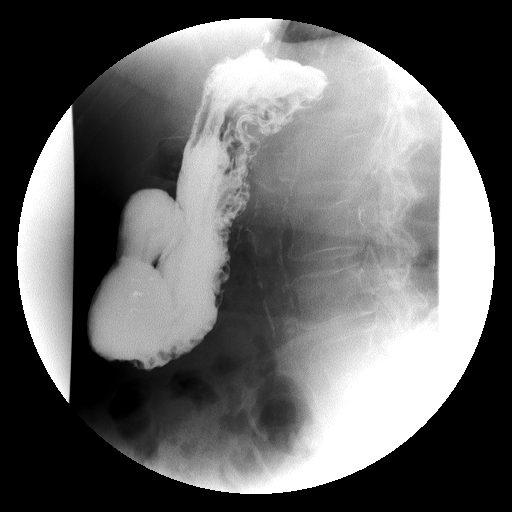

[Series 10: run · 1 of 1 slices shown (8 of 16)]
[im 1/1]
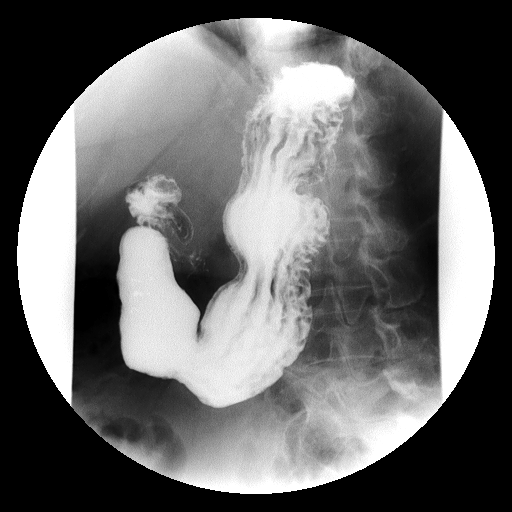

[Series 11: run · 1 of 1 slices shown (9 of 16)]
[im 1/1]
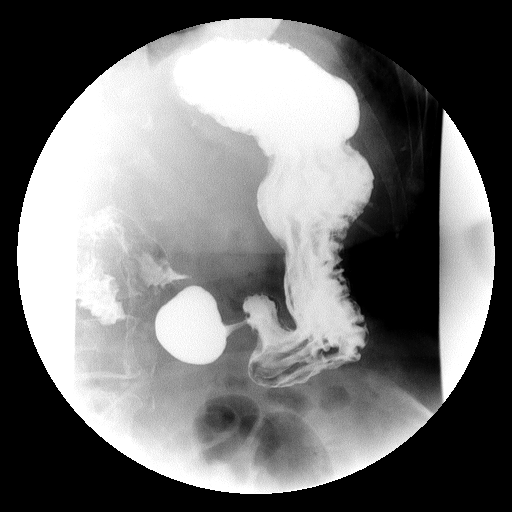

[Series 13: run · 1 of 1 slices shown (10 of 16)]
[im 1/1]
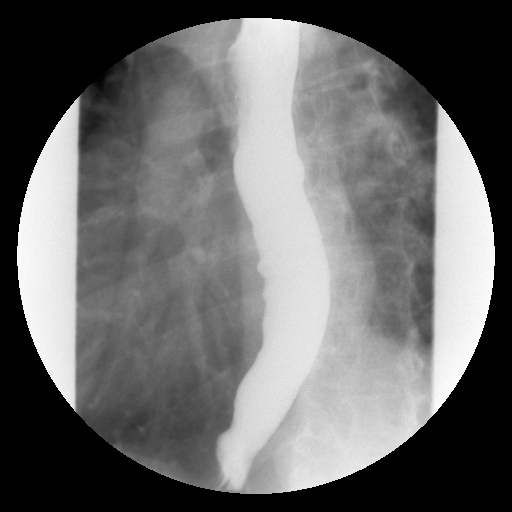

[Series 14: run · 1 of 1 slices shown (11 of 16)]
[im 1/1]
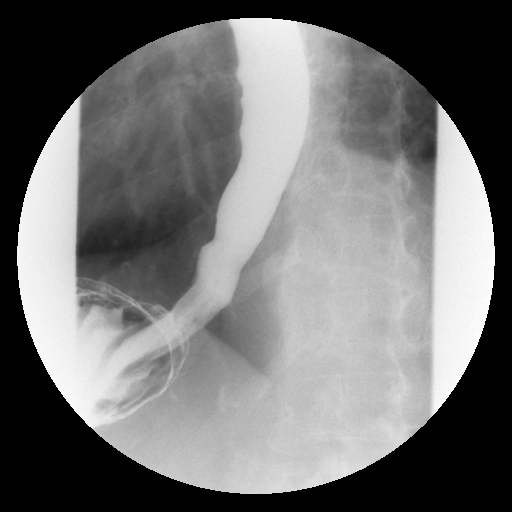

[Series 15: run · 1 of 1 slices shown (12 of 16)]
[im 1/1]
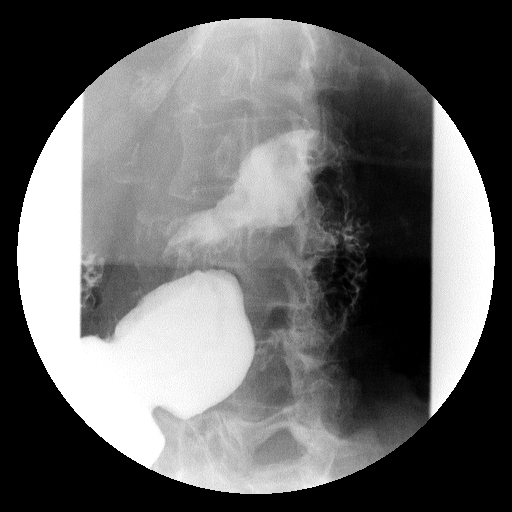

[Series 17: run · 1 of 1 slices shown (13 of 16)]
[im 1/1]
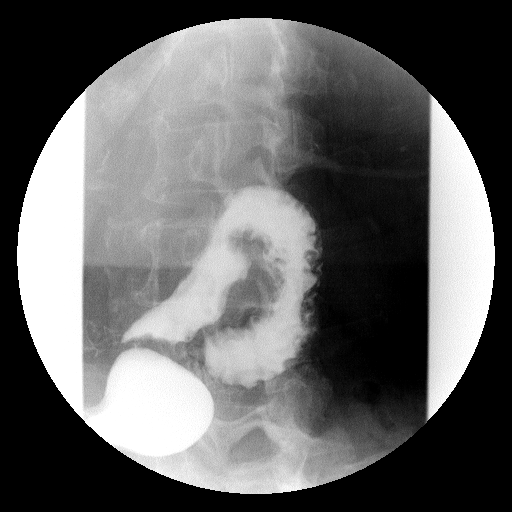

[Series 18: run · 1 of 1 slices shown (14 of 16)]
[im 1/1]
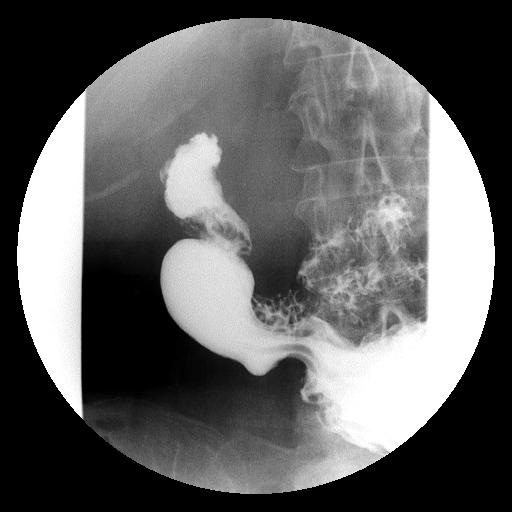

[Series 19: run · 1 of 1 slices shown (15 of 16)]
[im 1/1]
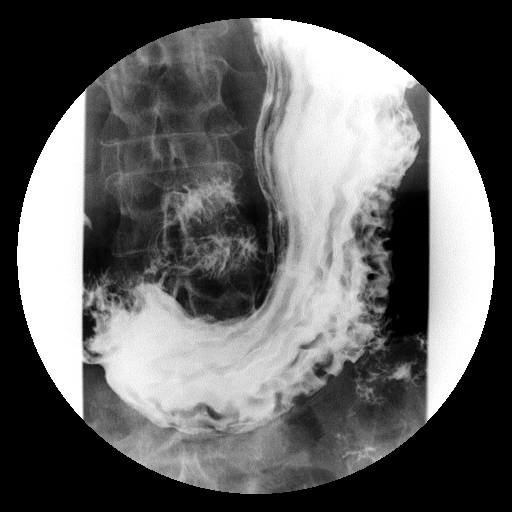

[Series 21: run · 1 of 1 slices shown (16 of 16)]
[im 1/1]
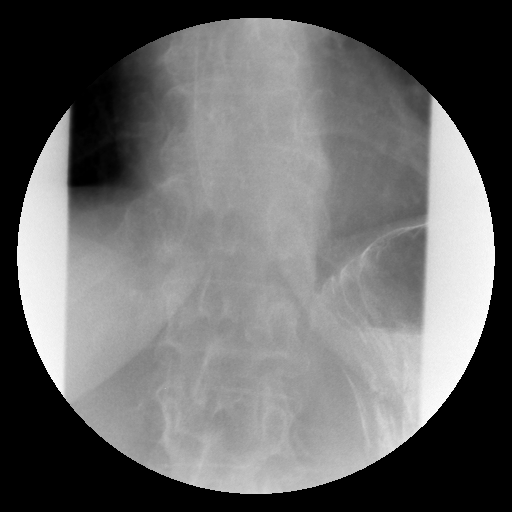

[Series 1001: view not recorded · 0.20mm/px · 1 of 1 slices shown]
[im 1/1]
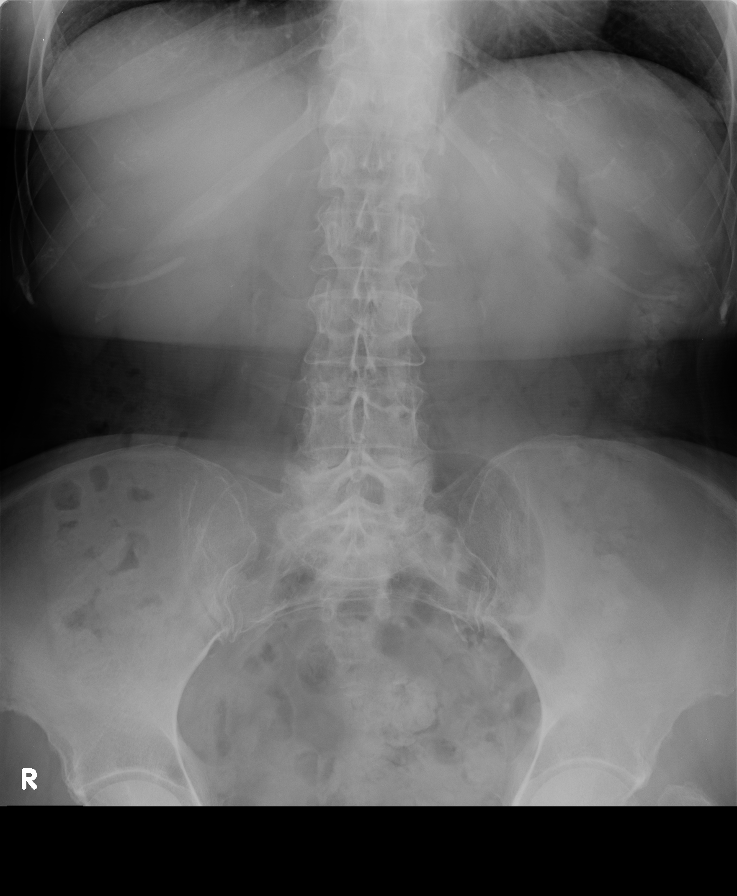

[18 of 24 positions shown; findings below may reference images not displayed]

FINDINGS: Preliminary KUB is negative

Esophageal moved mucosa and motility normal. No stricture or mass.
No hiatal hernia or reflux.

Barium tablet passed readily into the stomach without delay

Normal appearing stomach. New mucosal edema or ulcer. No mass
lesion. Stomach empties normally into the duodenum which also
appears normal.
IMPRESSION: Negative

## 2016-11-27 DIAGNOSIS — F320 Major depressive disorder, single episode, mild: Secondary | ICD-10-CM | POA: Diagnosis not present

## 2016-11-27 DIAGNOSIS — F419 Anxiety disorder, unspecified: Secondary | ICD-10-CM | POA: Diagnosis not present

## 2016-11-27 DIAGNOSIS — M47816 Spondylosis without myelopathy or radiculopathy, lumbar region: Secondary | ICD-10-CM | POA: Diagnosis not present

## 2016-11-27 DIAGNOSIS — K219 Gastro-esophageal reflux disease without esophagitis: Secondary | ICD-10-CM | POA: Diagnosis not present

## 2016-11-27 DIAGNOSIS — I10 Essential (primary) hypertension: Secondary | ICD-10-CM | POA: Diagnosis not present

## 2016-11-27 DIAGNOSIS — E7800 Pure hypercholesterolemia, unspecified: Secondary | ICD-10-CM | POA: Diagnosis not present

## 2017-05-26 DIAGNOSIS — F320 Major depressive disorder, single episode, mild: Secondary | ICD-10-CM | POA: Diagnosis not present

## 2017-05-26 DIAGNOSIS — E7800 Pure hypercholesterolemia, unspecified: Secondary | ICD-10-CM | POA: Diagnosis not present

## 2017-05-26 DIAGNOSIS — K219 Gastro-esophageal reflux disease without esophagitis: Secondary | ICD-10-CM | POA: Diagnosis not present

## 2017-05-26 DIAGNOSIS — I10 Essential (primary) hypertension: Secondary | ICD-10-CM | POA: Diagnosis not present

## 2017-05-26 DIAGNOSIS — M1990 Unspecified osteoarthritis, unspecified site: Secondary | ICD-10-CM | POA: Diagnosis not present

## 2017-05-26 DIAGNOSIS — F419 Anxiety disorder, unspecified: Secondary | ICD-10-CM | POA: Diagnosis not present

## 2017-06-30 ENCOUNTER — Other Ambulatory Visit: Payer: Self-pay | Admitting: Family Medicine

## 2017-06-30 DIAGNOSIS — Z1231 Encounter for screening mammogram for malignant neoplasm of breast: Secondary | ICD-10-CM

## 2017-07-23 ENCOUNTER — Ambulatory Visit
Admission: RE | Admit: 2017-07-23 | Discharge: 2017-07-23 | Disposition: A | Discharge status: Home / Self Care | Payer: Medicare HMO | Source: Ambulatory Visit | Attending: Family Medicine | Admitting: Family Medicine

## 2017-07-23 DIAGNOSIS — Z1231 Encounter for screening mammogram for malignant neoplasm of breast: Secondary | ICD-10-CM | POA: Diagnosis not present

## 2017-09-03 DIAGNOSIS — F320 Major depressive disorder, single episode, mild: Secondary | ICD-10-CM | POA: Diagnosis not present

## 2017-09-03 DIAGNOSIS — F419 Anxiety disorder, unspecified: Secondary | ICD-10-CM | POA: Diagnosis not present

## 2017-09-03 DIAGNOSIS — J209 Acute bronchitis, unspecified: Secondary | ICD-10-CM | POA: Diagnosis not present

## 2017-09-03 DIAGNOSIS — K219 Gastro-esophageal reflux disease without esophagitis: Secondary | ICD-10-CM | POA: Diagnosis not present

## 2017-09-03 DIAGNOSIS — M1990 Unspecified osteoarthritis, unspecified site: Secondary | ICD-10-CM | POA: Diagnosis not present

## 2017-09-03 DIAGNOSIS — I10 Essential (primary) hypertension: Secondary | ICD-10-CM | POA: Diagnosis not present

## 2017-09-03 DIAGNOSIS — E7800 Pure hypercholesterolemia, unspecified: Secondary | ICD-10-CM | POA: Diagnosis not present

## 2017-10-01 ENCOUNTER — Other Ambulatory Visit: Payer: Self-pay | Admitting: Family Medicine

## 2017-10-01 DIAGNOSIS — R1013 Epigastric pain: Secondary | ICD-10-CM

## 2017-10-01 DIAGNOSIS — R110 Nausea: Secondary | ICD-10-CM

## 2017-10-01 DIAGNOSIS — R634 Abnormal weight loss: Secondary | ICD-10-CM

## 2017-10-13 ENCOUNTER — Ambulatory Visit
Admission: RE | Admit: 2017-10-13 | Discharge: 2017-10-13 | Disposition: A | Discharge status: Home / Self Care | Payer: Medicare HMO | Source: Ambulatory Visit | Attending: Family Medicine | Admitting: Family Medicine

## 2017-10-13 DIAGNOSIS — R110 Nausea: Secondary | ICD-10-CM

## 2017-10-13 DIAGNOSIS — R634 Abnormal weight loss: Secondary | ICD-10-CM

## 2017-10-13 DIAGNOSIS — K8020 Calculus of gallbladder without cholecystitis without obstruction: Secondary | ICD-10-CM | POA: Diagnosis not present

## 2017-10-13 DIAGNOSIS — R1013 Epigastric pain: Secondary | ICD-10-CM

## 2017-11-04 DIAGNOSIS — R634 Abnormal weight loss: Secondary | ICD-10-CM | POA: Diagnosis not present

## 2017-11-04 DIAGNOSIS — K8020 Calculus of gallbladder without cholecystitis without obstruction: Secondary | ICD-10-CM | POA: Diagnosis not present

## 2017-11-04 DIAGNOSIS — R109 Unspecified abdominal pain: Secondary | ICD-10-CM | POA: Diagnosis not present

## 2017-11-08 ENCOUNTER — Other Ambulatory Visit: Payer: Self-pay | Admitting: Surgery

## 2017-11-08 DIAGNOSIS — K8070 Calculus of gallbladder and bile duct without cholecystitis without obstruction: Secondary | ICD-10-CM

## 2017-11-19 DIAGNOSIS — E876 Hypokalemia: Secondary | ICD-10-CM | POA: Diagnosis not present

## 2018-02-07 DIAGNOSIS — H25813 Combined forms of age-related cataract, bilateral: Secondary | ICD-10-CM | POA: Diagnosis not present

## 2018-02-25 DIAGNOSIS — H25811 Combined forms of age-related cataract, right eye: Secondary | ICD-10-CM | POA: Diagnosis not present

## 2018-02-28 DIAGNOSIS — H25811 Combined forms of age-related cataract, right eye: Secondary | ICD-10-CM | POA: Diagnosis not present

## 2018-02-28 DIAGNOSIS — H2511 Age-related nuclear cataract, right eye: Secondary | ICD-10-CM | POA: Diagnosis not present

## 2018-02-28 DIAGNOSIS — Z01818 Encounter for other preprocedural examination: Secondary | ICD-10-CM | POA: Diagnosis not present

## 2018-02-28 DIAGNOSIS — H269 Unspecified cataract: Secondary | ICD-10-CM | POA: Diagnosis not present

## 2018-03-21 DIAGNOSIS — Z961 Presence of intraocular lens: Secondary | ICD-10-CM | POA: Diagnosis not present

## 2018-03-30 DIAGNOSIS — K219 Gastro-esophageal reflux disease without esophagitis: Secondary | ICD-10-CM | POA: Diagnosis not present

## 2018-03-30 DIAGNOSIS — E7800 Pure hypercholesterolemia, unspecified: Secondary | ICD-10-CM | POA: Diagnosis not present

## 2018-03-30 DIAGNOSIS — F419 Anxiety disorder, unspecified: Secondary | ICD-10-CM | POA: Diagnosis not present

## 2018-03-30 DIAGNOSIS — I2510 Atherosclerotic heart disease of native coronary artery without angina pectoris: Secondary | ICD-10-CM | POA: Diagnosis not present

## 2018-03-30 DIAGNOSIS — F3341 Major depressive disorder, recurrent, in partial remission: Secondary | ICD-10-CM | POA: Diagnosis not present

## 2018-03-30 DIAGNOSIS — I10 Essential (primary) hypertension: Secondary | ICD-10-CM | POA: Diagnosis not present

## 2018-03-30 DIAGNOSIS — M1990 Unspecified osteoarthritis, unspecified site: Secondary | ICD-10-CM | POA: Diagnosis not present

## 2018-05-10 DIAGNOSIS — E876 Hypokalemia: Secondary | ICD-10-CM | POA: Diagnosis not present

## 2018-11-03 DIAGNOSIS — K641 Second degree hemorrhoids: Secondary | ICD-10-CM | POA: Diagnosis not present

## 2018-11-03 DIAGNOSIS — K921 Melena: Secondary | ICD-10-CM | POA: Diagnosis not present

## 2018-11-03 DIAGNOSIS — R1012 Left upper quadrant pain: Secondary | ICD-10-CM | POA: Diagnosis not present

## 2018-11-03 DIAGNOSIS — R300 Dysuria: Secondary | ICD-10-CM | POA: Diagnosis not present

## 2018-11-08 DIAGNOSIS — E875 Hyperkalemia: Secondary | ICD-10-CM | POA: Diagnosis not present

## 2019-01-26 DIAGNOSIS — J0100 Acute maxillary sinusitis, unspecified: Secondary | ICD-10-CM | POA: Diagnosis not present

## 2019-01-26 DIAGNOSIS — M545 Low back pain: Secondary | ICD-10-CM | POA: Diagnosis not present

## 2019-02-14 DIAGNOSIS — M1990 Unspecified osteoarthritis, unspecified site: Secondary | ICD-10-CM | POA: Diagnosis not present

## 2019-02-14 DIAGNOSIS — K219 Gastro-esophageal reflux disease without esophagitis: Secondary | ICD-10-CM | POA: Diagnosis not present

## 2019-02-14 DIAGNOSIS — I10 Essential (primary) hypertension: Secondary | ICD-10-CM | POA: Diagnosis not present

## 2019-02-14 DIAGNOSIS — M545 Low back pain: Secondary | ICD-10-CM | POA: Diagnosis not present

## 2019-02-14 DIAGNOSIS — E7800 Pure hypercholesterolemia, unspecified: Secondary | ICD-10-CM | POA: Diagnosis not present

## 2019-02-14 DIAGNOSIS — F3341 Major depressive disorder, recurrent, in partial remission: Secondary | ICD-10-CM | POA: Diagnosis not present

## 2019-02-15 ENCOUNTER — Ambulatory Visit
Admission: RE | Admit: 2019-02-15 | Discharge: 2019-02-15 | Disposition: A | Discharge status: Home / Self Care | Payer: Medicare HMO | Source: Ambulatory Visit | Attending: Family Medicine | Admitting: Family Medicine

## 2019-02-15 ENCOUNTER — Other Ambulatory Visit: Payer: Self-pay | Admitting: Family Medicine

## 2019-02-15 DIAGNOSIS — M545 Low back pain, unspecified: Secondary | ICD-10-CM

## 2019-03-13 DIAGNOSIS — M4722 Other spondylosis with radiculopathy, cervical region: Secondary | ICD-10-CM | POA: Diagnosis not present

## 2019-04-19 DIAGNOSIS — R1084 Generalized abdominal pain: Secondary | ICD-10-CM | POA: Diagnosis not present

## 2019-04-19 DIAGNOSIS — K219 Gastro-esophageal reflux disease without esophagitis: Secondary | ICD-10-CM | POA: Diagnosis not present

## 2019-04-19 DIAGNOSIS — F3341 Major depressive disorder, recurrent, in partial remission: Secondary | ICD-10-CM | POA: Diagnosis not present

## 2019-04-19 DIAGNOSIS — E7800 Pure hypercholesterolemia, unspecified: Secondary | ICD-10-CM | POA: Diagnosis not present

## 2019-04-19 DIAGNOSIS — K8020 Calculus of gallbladder without cholecystitis without obstruction: Secondary | ICD-10-CM | POA: Diagnosis not present

## 2019-04-19 DIAGNOSIS — F419 Anxiety disorder, unspecified: Secondary | ICD-10-CM | POA: Diagnosis not present

## 2019-04-19 DIAGNOSIS — I10 Essential (primary) hypertension: Secondary | ICD-10-CM | POA: Diagnosis not present

## 2019-06-01 DIAGNOSIS — H2512 Age-related nuclear cataract, left eye: Secondary | ICD-10-CM | POA: Diagnosis not present

## 2019-07-21 DIAGNOSIS — M549 Dorsalgia, unspecified: Secondary | ICD-10-CM | POA: Diagnosis not present

## 2019-08-08 ENCOUNTER — Ambulatory Visit
Admission: RE | Admit: 2019-08-08 | Discharge: 2019-08-08 | Disposition: A | Discharge status: Home / Self Care | Payer: Medicare HMO | Source: Ambulatory Visit | Attending: Family Medicine | Admitting: Family Medicine

## 2019-08-08 ENCOUNTER — Other Ambulatory Visit: Payer: Self-pay | Admitting: Family Medicine

## 2019-08-08 ENCOUNTER — Other Ambulatory Visit: Payer: Self-pay

## 2019-08-08 DIAGNOSIS — M545 Low back pain, unspecified: Secondary | ICD-10-CM

## 2019-08-08 DIAGNOSIS — M48061 Spinal stenosis, lumbar region without neurogenic claudication: Secondary | ICD-10-CM | POA: Diagnosis not present

## 2019-10-12 DIAGNOSIS — M545 Low back pain: Secondary | ICD-10-CM | POA: Diagnosis not present

## 2019-10-19 DIAGNOSIS — F419 Anxiety disorder, unspecified: Secondary | ICD-10-CM | POA: Diagnosis not present

## 2019-10-19 DIAGNOSIS — K219 Gastro-esophageal reflux disease without esophagitis: Secondary | ICD-10-CM | POA: Diagnosis not present

## 2019-10-19 DIAGNOSIS — I2510 Atherosclerotic heart disease of native coronary artery without angina pectoris: Secondary | ICD-10-CM | POA: Diagnosis not present

## 2019-10-19 DIAGNOSIS — I10 Essential (primary) hypertension: Secondary | ICD-10-CM | POA: Diagnosis not present

## 2019-10-19 DIAGNOSIS — E7800 Pure hypercholesterolemia, unspecified: Secondary | ICD-10-CM | POA: Diagnosis not present

## 2019-10-19 DIAGNOSIS — Z1231 Encounter for screening mammogram for malignant neoplasm of breast: Secondary | ICD-10-CM | POA: Diagnosis not present

## 2019-10-19 DIAGNOSIS — S32000A Wedge compression fracture of unspecified lumbar vertebra, initial encounter for closed fracture: Secondary | ICD-10-CM | POA: Diagnosis not present

## 2019-10-20 ENCOUNTER — Other Ambulatory Visit: Payer: Self-pay | Admitting: Family Medicine

## 2019-10-20 DIAGNOSIS — S32000A Wedge compression fracture of unspecified lumbar vertebra, initial encounter for closed fracture: Secondary | ICD-10-CM

## 2019-10-20 DIAGNOSIS — Z1231 Encounter for screening mammogram for malignant neoplasm of breast: Secondary | ICD-10-CM

## 2019-11-13 DIAGNOSIS — M4856XA Collapsed vertebra, not elsewhere classified, lumbar region, initial encounter for fracture: Secondary | ICD-10-CM | POA: Diagnosis not present

## 2019-11-13 DIAGNOSIS — M545 Low back pain: Secondary | ICD-10-CM | POA: Diagnosis not present

## 2019-12-01 ENCOUNTER — Other Ambulatory Visit: Payer: Self-pay

## 2019-12-01 ENCOUNTER — Ambulatory Visit
Admission: RE | Admit: 2019-12-01 | Discharge: 2019-12-01 | Disposition: A | Discharge status: Home / Self Care | Payer: Medicare HMO | Source: Ambulatory Visit | Attending: Family Medicine | Admitting: Family Medicine

## 2019-12-01 DIAGNOSIS — Z1231 Encounter for screening mammogram for malignant neoplasm of breast: Secondary | ICD-10-CM | POA: Diagnosis not present

## 2019-12-01 DIAGNOSIS — M810 Age-related osteoporosis without current pathological fracture: Secondary | ICD-10-CM | POA: Diagnosis not present

## 2019-12-01 DIAGNOSIS — Z780 Asymptomatic menopausal state: Secondary | ICD-10-CM | POA: Diagnosis not present

## 2019-12-01 DIAGNOSIS — S32000A Wedge compression fracture of unspecified lumbar vertebra, initial encounter for closed fracture: Secondary | ICD-10-CM

## 2019-12-01 DIAGNOSIS — M85852 Other specified disorders of bone density and structure, left thigh: Secondary | ICD-10-CM | POA: Diagnosis not present

## 2019-12-21 DIAGNOSIS — M5136 Other intervertebral disc degeneration, lumbar region: Secondary | ICD-10-CM | POA: Diagnosis not present

## 2020-01-16 DIAGNOSIS — I2510 Atherosclerotic heart disease of native coronary artery without angina pectoris: Secondary | ICD-10-CM | POA: Diagnosis not present

## 2020-01-16 DIAGNOSIS — I10 Essential (primary) hypertension: Secondary | ICD-10-CM | POA: Diagnosis not present

## 2020-01-16 DIAGNOSIS — E7800 Pure hypercholesterolemia, unspecified: Secondary | ICD-10-CM | POA: Diagnosis not present

## 2020-01-16 DIAGNOSIS — F320 Major depressive disorder, single episode, mild: Secondary | ICD-10-CM | POA: Diagnosis not present

## 2020-01-22 DIAGNOSIS — F320 Major depressive disorder, single episode, mild: Secondary | ICD-10-CM | POA: Diagnosis not present

## 2020-01-22 DIAGNOSIS — I10 Essential (primary) hypertension: Secondary | ICD-10-CM | POA: Diagnosis not present

## 2020-01-22 DIAGNOSIS — I2510 Atherosclerotic heart disease of native coronary artery without angina pectoris: Secondary | ICD-10-CM | POA: Diagnosis not present

## 2020-01-22 DIAGNOSIS — M1990 Unspecified osteoarthritis, unspecified site: Secondary | ICD-10-CM | POA: Diagnosis not present

## 2020-01-22 DIAGNOSIS — M47816 Spondylosis without myelopathy or radiculopathy, lumbar region: Secondary | ICD-10-CM | POA: Diagnosis not present

## 2020-01-22 DIAGNOSIS — M810 Age-related osteoporosis without current pathological fracture: Secondary | ICD-10-CM | POA: Diagnosis not present

## 2020-01-22 DIAGNOSIS — K219 Gastro-esophageal reflux disease without esophagitis: Secondary | ICD-10-CM | POA: Diagnosis not present

## 2020-01-22 DIAGNOSIS — E7800 Pure hypercholesterolemia, unspecified: Secondary | ICD-10-CM | POA: Diagnosis not present

## 2020-01-22 DIAGNOSIS — F3341 Major depressive disorder, recurrent, in partial remission: Secondary | ICD-10-CM | POA: Diagnosis not present

## 2020-02-20 DIAGNOSIS — E7800 Pure hypercholesterolemia, unspecified: Secondary | ICD-10-CM | POA: Diagnosis not present

## 2020-02-20 DIAGNOSIS — F419 Anxiety disorder, unspecified: Secondary | ICD-10-CM | POA: Diagnosis not present

## 2020-02-20 DIAGNOSIS — K219 Gastro-esophageal reflux disease without esophagitis: Secondary | ICD-10-CM | POA: Diagnosis not present

## 2020-02-20 DIAGNOSIS — M47816 Spondylosis without myelopathy or radiculopathy, lumbar region: Secondary | ICD-10-CM | POA: Diagnosis not present

## 2020-02-20 DIAGNOSIS — R109 Unspecified abdominal pain: Secondary | ICD-10-CM | POA: Diagnosis not present

## 2020-02-20 DIAGNOSIS — I2510 Atherosclerotic heart disease of native coronary artery without angina pectoris: Secondary | ICD-10-CM | POA: Diagnosis not present

## 2020-02-20 DIAGNOSIS — I10 Essential (primary) hypertension: Secondary | ICD-10-CM | POA: Diagnosis not present

## 2020-04-25 DIAGNOSIS — R197 Diarrhea, unspecified: Secondary | ICD-10-CM | POA: Diagnosis not present

## 2020-08-16 IMAGING — CR DG LUMBAR SPINE 2-3V
3 series · 3 of 3 positions shown · non-contrast
Comparison: 08/23/2013.

CLINICAL DATA: Acute right-sided low back pain without sciatica,
twisting injury 1 month ago, initial encounter.

EXAM:
LUMBAR SPINE - 2-3 VIEW

[t lumbar spine ap]
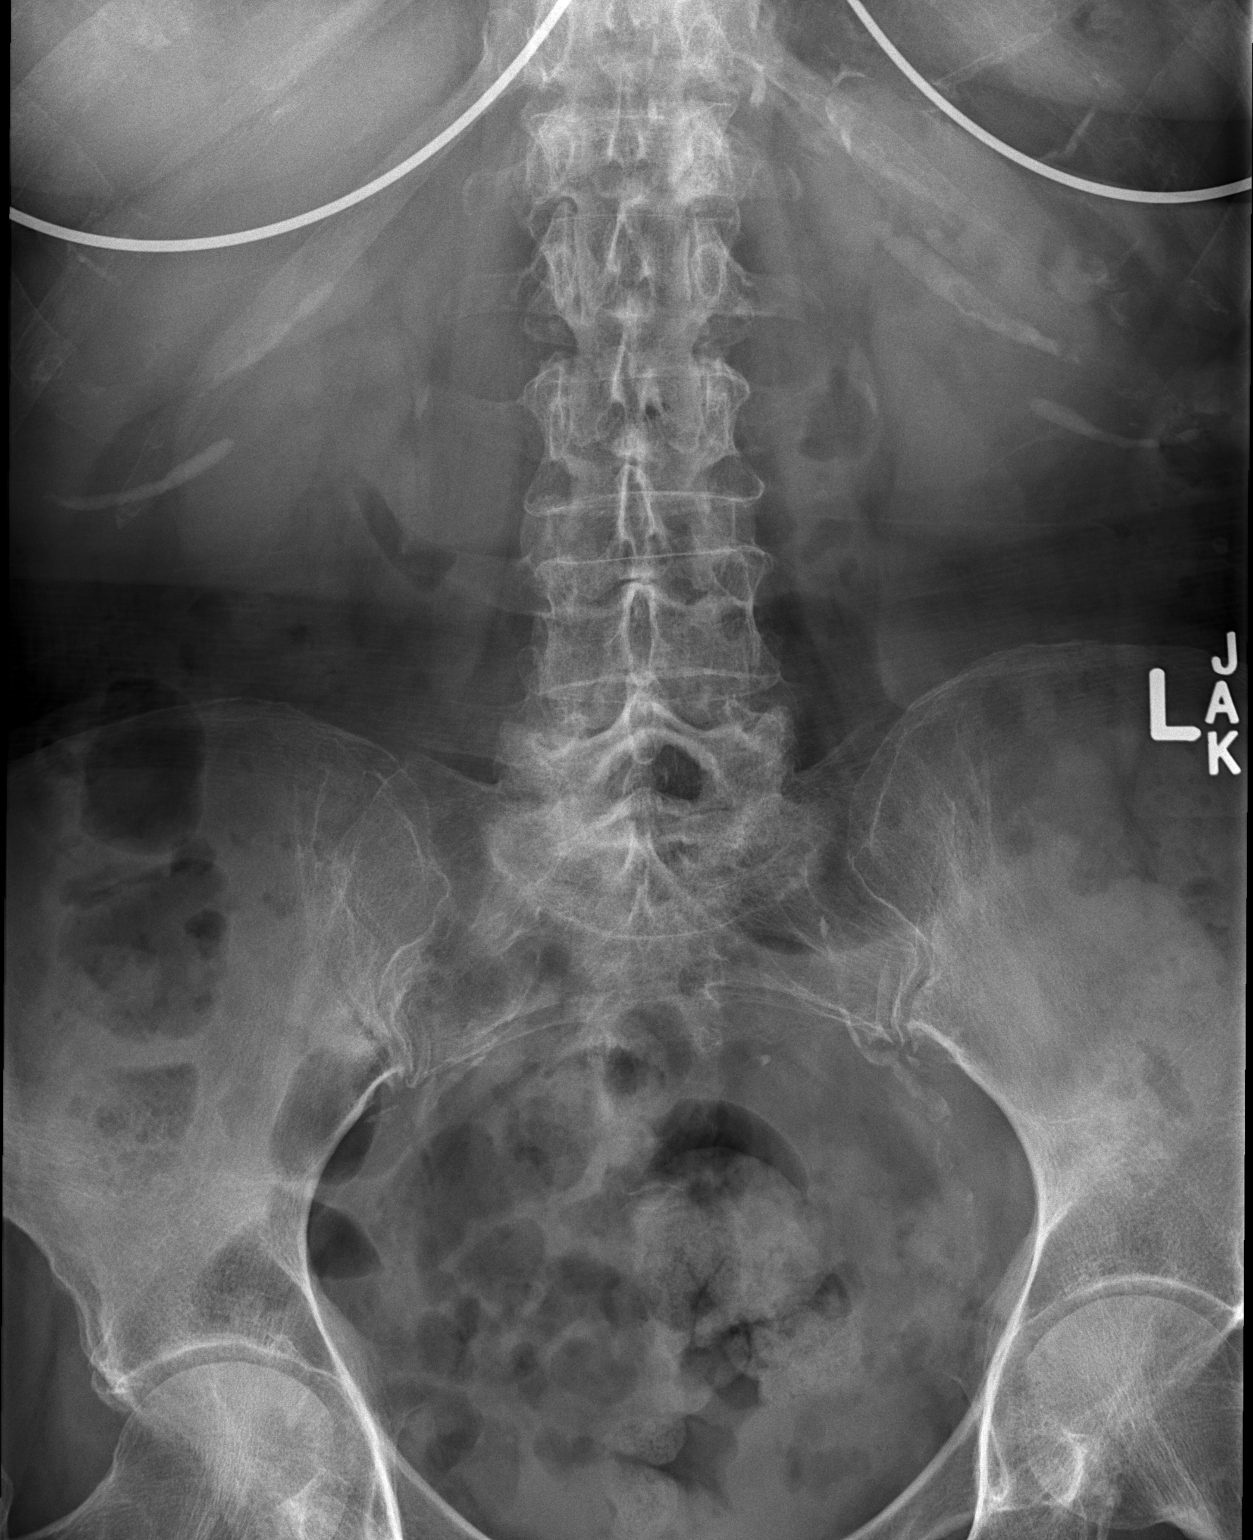

[t lumbar spine lat]
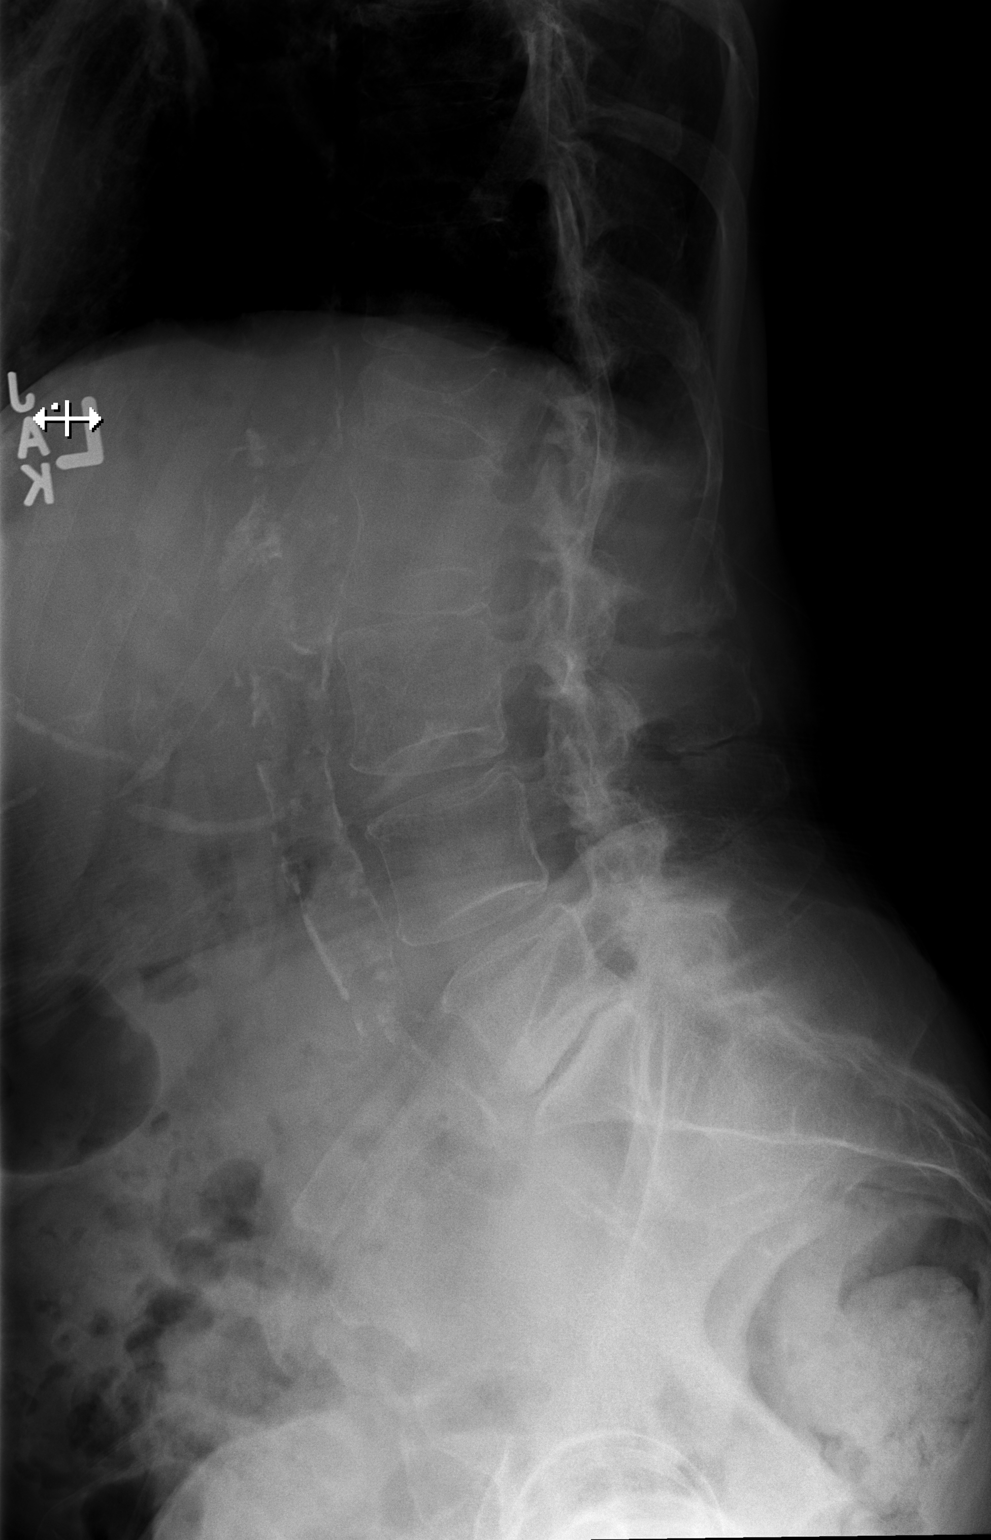

[t lumbar l-5 s-1 spot]
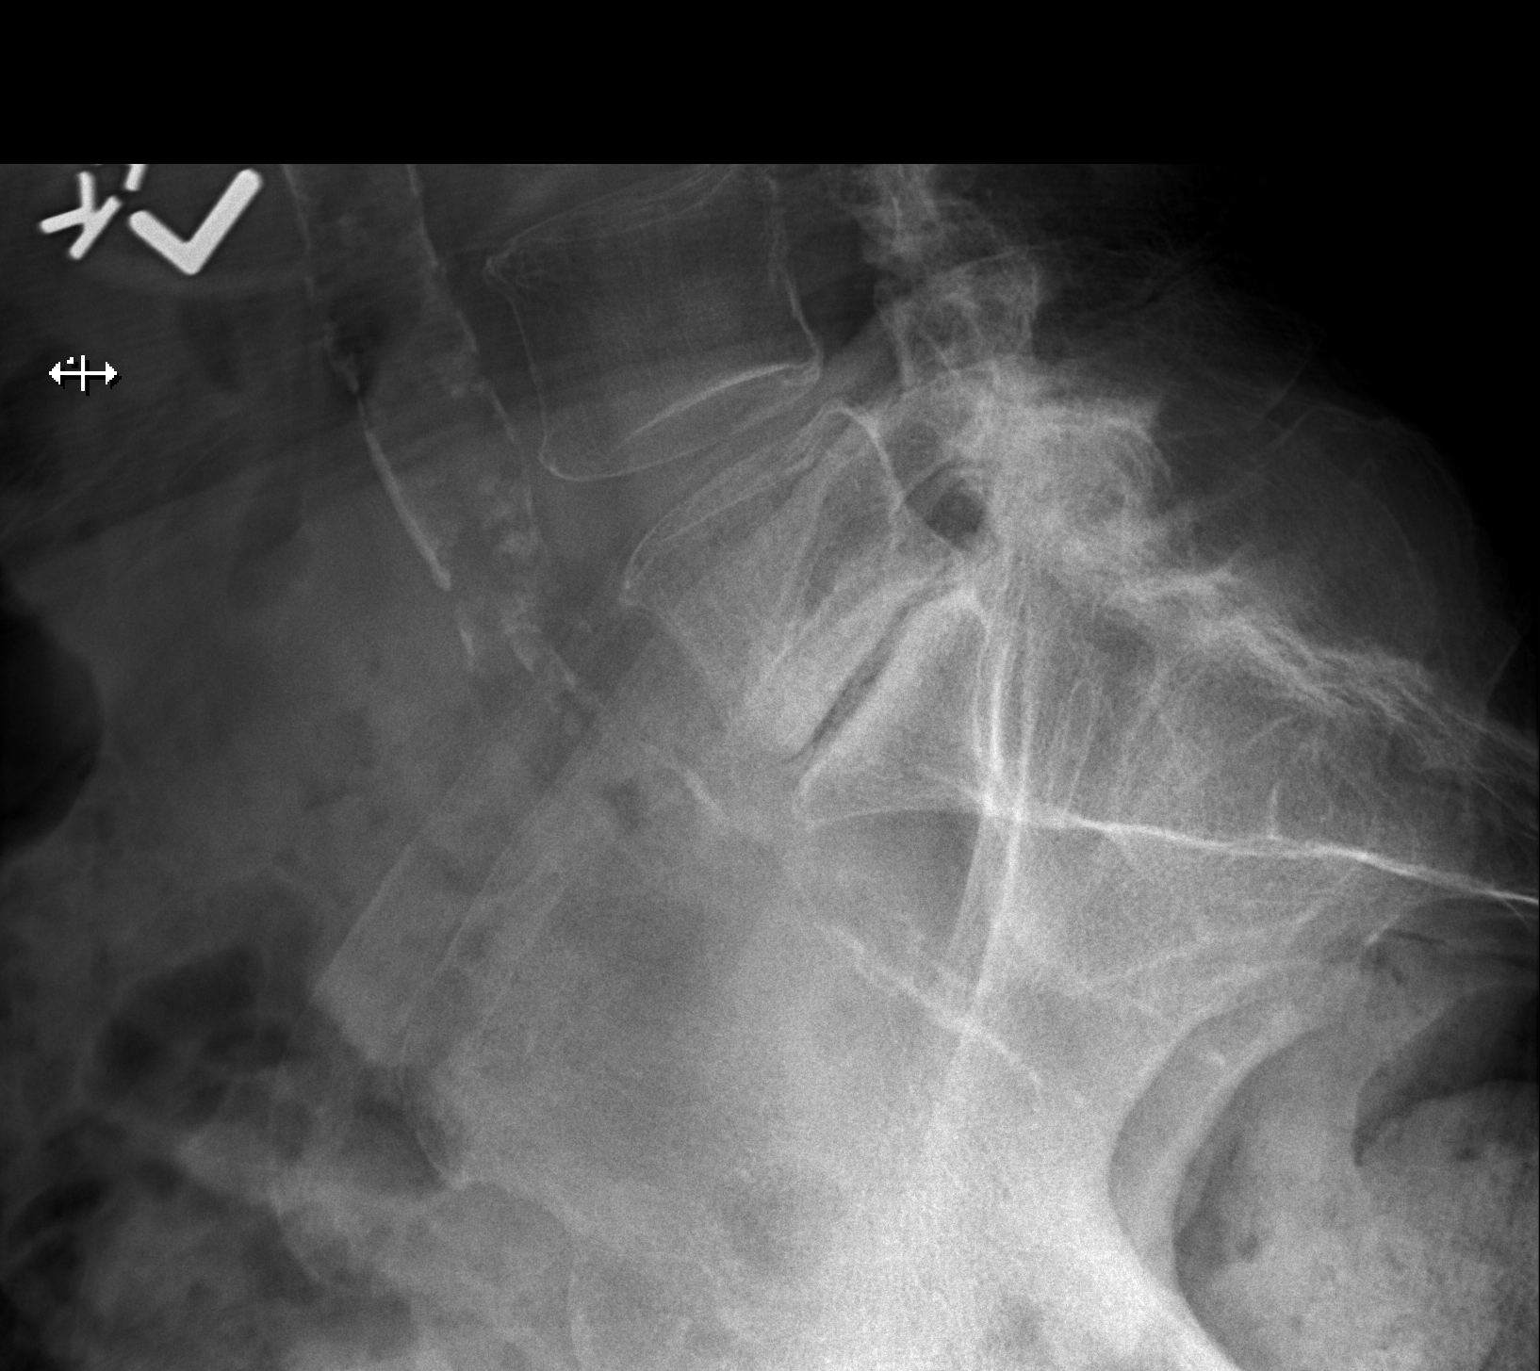

[3 of 3 positions shown; findings below may reference images not displayed]

FINDINGS: L1 compression deformity is new from 08/23/2013 but age
indeterminate. Difficult to exclude a T11 compression deformity,
this area is poorly visualized. Vertebral body height is otherwise
normal. Alignment is within normal limits. Endplate degenerative
changes and loss of disc space height with facet hypertrophy at
L5-S1 with minimal (3 mm) retrolisthesis of L5 on S1. Mild
multilevel facet hypertrophy in the lumbar spine.

Atherosclerotic calcification of the aorta.
IMPRESSION: 1. L1 compression fracture is new from 08/23/2013 but age
indeterminate. Difficult to exclude a T11 fracture. Dedicated
imaging of the thoracic spine is recommended, as clinically
indicated.
2. Advanced L5-S1 degenerative disc disease with slight
retrolisthesis of L5 on S1, and facet hypertrophy.

## 2020-08-27 DIAGNOSIS — I2510 Atherosclerotic heart disease of native coronary artery without angina pectoris: Secondary | ICD-10-CM | POA: Diagnosis not present

## 2020-08-27 DIAGNOSIS — M1990 Unspecified osteoarthritis, unspecified site: Secondary | ICD-10-CM | POA: Diagnosis not present

## 2020-08-27 DIAGNOSIS — G629 Polyneuropathy, unspecified: Secondary | ICD-10-CM | POA: Diagnosis not present

## 2020-08-27 DIAGNOSIS — I10 Essential (primary) hypertension: Secondary | ICD-10-CM | POA: Diagnosis not present

## 2020-08-27 DIAGNOSIS — Z0000 Encounter for general adult medical examination without abnormal findings: Secondary | ICD-10-CM | POA: Diagnosis not present

## 2020-08-27 DIAGNOSIS — K219 Gastro-esophageal reflux disease without esophagitis: Secondary | ICD-10-CM | POA: Diagnosis not present

## 2020-08-27 DIAGNOSIS — F419 Anxiety disorder, unspecified: Secondary | ICD-10-CM | POA: Diagnosis not present

## 2020-08-27 DIAGNOSIS — E7800 Pure hypercholesterolemia, unspecified: Secondary | ICD-10-CM | POA: Diagnosis not present

## 2020-09-19 DIAGNOSIS — M1990 Unspecified osteoarthritis, unspecified site: Secondary | ICD-10-CM | POA: Diagnosis not present

## 2020-09-19 DIAGNOSIS — K219 Gastro-esophageal reflux disease without esophagitis: Secondary | ICD-10-CM | POA: Diagnosis not present

## 2020-09-19 DIAGNOSIS — F320 Major depressive disorder, single episode, mild: Secondary | ICD-10-CM | POA: Diagnosis not present

## 2020-09-19 DIAGNOSIS — I2510 Atherosclerotic heart disease of native coronary artery without angina pectoris: Secondary | ICD-10-CM | POA: Diagnosis not present

## 2020-09-19 DIAGNOSIS — E7800 Pure hypercholesterolemia, unspecified: Secondary | ICD-10-CM | POA: Diagnosis not present

## 2020-09-19 DIAGNOSIS — M47816 Spondylosis without myelopathy or radiculopathy, lumbar region: Secondary | ICD-10-CM | POA: Diagnosis not present

## 2020-09-19 DIAGNOSIS — F3341 Major depressive disorder, recurrent, in partial remission: Secondary | ICD-10-CM | POA: Diagnosis not present

## 2020-09-19 DIAGNOSIS — I10 Essential (primary) hypertension: Secondary | ICD-10-CM | POA: Diagnosis not present

## 2020-09-19 DIAGNOSIS — M810 Age-related osteoporosis without current pathological fracture: Secondary | ICD-10-CM | POA: Diagnosis not present

## 2021-01-07 ENCOUNTER — Other Ambulatory Visit: Payer: Self-pay | Admitting: Family Medicine

## 2021-01-07 DIAGNOSIS — Z1231 Encounter for screening mammogram for malignant neoplasm of breast: Secondary | ICD-10-CM

## 2021-02-07 ENCOUNTER — Ambulatory Visit: Payer: Medicare HMO

## 2021-03-03 DIAGNOSIS — I10 Essential (primary) hypertension: Secondary | ICD-10-CM | POA: Diagnosis not present

## 2021-03-03 DIAGNOSIS — M47816 Spondylosis without myelopathy or radiculopathy, lumbar region: Secondary | ICD-10-CM | POA: Diagnosis not present

## 2021-03-03 DIAGNOSIS — E7800 Pure hypercholesterolemia, unspecified: Secondary | ICD-10-CM | POA: Diagnosis not present

## 2021-03-03 DIAGNOSIS — K219 Gastro-esophageal reflux disease without esophagitis: Secondary | ICD-10-CM | POA: Diagnosis not present

## 2021-03-03 DIAGNOSIS — M25511 Pain in right shoulder: Secondary | ICD-10-CM | POA: Diagnosis not present

## 2021-04-14 DIAGNOSIS — M7581 Other shoulder lesions, right shoulder: Secondary | ICD-10-CM | POA: Diagnosis not present

## 2021-04-14 DIAGNOSIS — M19011 Primary osteoarthritis, right shoulder: Secondary | ICD-10-CM | POA: Diagnosis not present

## 2021-05-22 ENCOUNTER — Encounter: Payer: Self-pay | Admitting: *Deleted

## 2021-05-22 ENCOUNTER — Other Ambulatory Visit: Payer: Self-pay | Admitting: *Deleted

## 2021-05-23 ENCOUNTER — Ambulatory Visit (INDEPENDENT_AMBULATORY_CARE_PROVIDER_SITE_OTHER): Payer: Medicare HMO | Admitting: Neurology

## 2021-05-23 ENCOUNTER — Encounter: Payer: Self-pay | Admitting: Neurology

## 2021-05-23 VITALS — BP 162/73 | HR 60 | Wt 144.5 lb

## 2021-05-23 DIAGNOSIS — G609 Hereditary and idiopathic neuropathy, unspecified: Secondary | ICD-10-CM

## 2021-05-23 DIAGNOSIS — R208 Other disturbances of skin sensation: Secondary | ICD-10-CM | POA: Diagnosis not present

## 2021-05-23 MED ORDER — PREGABALIN 150 MG PO CAPS: 150.0000 mg | ORAL_CAPSULE | Freq: Two times a day (BID) | ORAL | 11 refills | Status: DC

## 2021-05-23 NOTE — Patient Instructions (Signed)
Stop Gabapentin  ?Start Pregabalin 150 mg twice daily  ?If symptoms not improved after 2 weeks, please contact me, plan will be in increase to 300 mg BID as tolerated  ?Follow up in 6 months  ?

## 2021-05-23 NOTE — Progress Notes (Signed)
[facility]  PATIENT: Susan Marsh DOB: Jan 31, 1941  REQUESTING CLINICIAN: Shirline Frees, MD HISTORY FROM: Patient and daughter  REASON FOR VISIT: Burning sensation in BLEs.   HISTORICAL  CHIEF COMPLAINT:  Chief Complaint  Patient presents with   New Patient (Initial Visit)    Room 81 w/ daughter, Malachy Mood. Burning, numbness and cold sensation in bilateral legs and feet. Worse at night. Disrupts her sleep.     HISTORY OF PRESENT ILLNESS:  This is a 81 year old woman with past medical history of hypertension, hyperlipidemia, psoriasis, who is presenting with burning sensation in her lower extremities left greater than right for the past 2 years.  Patient reports pain and burning sensation, worse with at night, stated that the symptoms are worse when touched by the bed sheet or wearing pajama pants at night.  Cannot think of anything that makes it better.  She did follow-up with his primary care doctor who started her on gabapentin, currently taking 300 mg at night and 100 mg in the morning but no improvement of her symptoms.  She cannot go up on the morning dose of gabapentin due to somnolence.  No history of diabetes.    OTHER MEDICAL CONDITIONS: Hypertension, hyperlipidemia    REVIEW OF SYSTEMS: Full 14 system review of systems performed and negative with exception of: as noted in the HPI   ALLERGIES: Allergies  Allergen Reactions   Alendronate Sodium     Other reaction(s): GI upset   Other     Cortisporin otic Other reaction(s): GI upset    HOME MEDICATIONS: Outpatient Medications Prior to Visit  Medication Sig Dispense Refill   amLODipine (NORVASC) 10 MG tablet Take 1 tablet by mouth daily.     aspirin 81 MG chewable tablet Chew 81 mg by mouth daily.     hydrochlorothiazide (HYDRODIURIL) 25 MG tablet 1 tablet in the morning     lisinopril (PRINIVIL,ZESTRIL) 40 MG tablet Take 40 mg by mouth daily.     lisinopril (ZESTRIL) 40 MG tablet daily.      LORazepam (ATIVAN) 0.5 MG tablet Take 0.5 mg by mouth 2 (two) times daily as needed. nervousness     meloxicam (MOBIC) 15 MG tablet 1 tablet     metoprolol (LOPRESSOR) 50 MG tablet Take 50 mg by mouth 2 (two) times daily.     pantoprazole (PROTONIX) 40 MG tablet Take 1 tablet by mouth daily.     pravastatin (PRAVACHOL) 40 MG tablet Take 40 mg by mouth daily.     ranitidine (ZANTAC) 150 MG tablet Take 150 mg by mouth 2 (two) times daily as needed. reflux     gabapentin (NEURONTIN) 100 MG capsule 1 capsule in the AM 3 capsules in the PM     No facility-administered medications prior to visit.    PAST MEDICAL HISTORY: Past Medical History:  Diagnosis Date   Aortic stenosis    ASCVD (arteriosclerotic cardiovascular disease)    s/p stent   GERD (gastroesophageal reflux disease)    Hearing loss    R>L   High cholesterol    Hypertension    Osteoarthritis     PAST SURGICAL HISTORY: Past Surgical History:  Procedure Laterality Date   BLADDER SURGERY     CARDIAC SURGERY      FAMILY HISTORY: Family History  Problem Relation Age of Onset   Other Mother        unsure of history   Other Father  unsure of history   Heart disease Brother     SOCIAL HISTORY: Social History   Socioeconomic History   Marital status: Not on file    Spouse name: Not on file   Number of children: 4   Years of education: 12   Highest education level: High school graduate  Occupational History   Occupation: Retired  Tobacco Use   Smoking status: Never   Smokeless tobacco: Not on file  Substance and Sexual Activity   Alcohol use: Never   Drug use: Never   Sexual activity: Not on file  Other Topics Concern   Not on file  Social History Narrative   Lives with son.   Right-handed.   No daily caffeine.   Social Determinants of Health   Financial Resource Strain: Not on file  Food Insecurity: Not on file  Transportation Needs: Not on file  Physical Activity: Not on file  Stress:  Not on file  Social Connections: Not on file  Intimate Partner Violence: Not on file    PHYSICAL EXAM  GENERAL EXAM/CONSTITUTIONAL: Vitals:  Vitals:   05/23/21 1059  BP: (!) 162/73  Pulse: 60  Weight: 144 lb 8 oz (65.5 kg)   There is no height or weight on file to calculate BMI. Wt Readings from Last 3 Encounters:  05/23/21 144 lb 8 oz (65.5 kg)  02/18/15 151 lb (68.5 kg)   Patient is in no distress; well developed, nourished and groomed; neck is supple  EYES: Pupils round and reactive to light, Visual fields full to confrontation, Extraocular movements intacts,   MUSCULOSKELETAL: Gait, strength, tone, movements noted in Neurologic exam below  NEUROLOGIC: MENTAL STATUS:  No flowsheet data found. awake, alert, oriented to person, place and time recent and remote memory intact normal attention and concentration language fluent, comprehension intact, naming intact fund of knowledge appropriate  CRANIAL NERVE: 2nd, 3rd, 4th, 6th - pupils equal and reactive to light, visual fields full to confrontation, extraocular muscles intact, no nystagmus 5th - facial sensation symmetric 7th - facial strength symmetric 8th - hearing intact 9th - palate elevates symmetrically, uvula midline 11th - shoulder shrug symmetric 12th - tongue protrusion midline  MOTOR:  normal bulk and tone, full strength in the BUE, BLE  SENSORY:  normal and symmetric to light touch, Increase sensation to pinprick Left leg when compared to right. Decrease  vibration in both lower extremities, left worse than right.  COORDINATION:  finger-nose-finger, fine finger movements normal  REFLEXES:  deep tendon reflexes present and symmetric  GAIT/STATION:  normal   DIAGNOSTIC DATA (LABS, IMAGING, TESTING) - I reviewed patient records, labs, notes, testing and imaging myself where available.  Lab Results  Component Value Date   WBC 6.8 02/18/2015   HGB 14.7 02/18/2015   HCT 40.8 02/18/2015   MCV  85.9 02/18/2015   PLT 256 02/18/2015      Component Value Date/Time   NA 128 (L) 02/18/2015 1818   K 2.9 (L) 02/18/2015 1818   CL 90 (L) 02/18/2015 1818   CO2 26 02/18/2015 1818   GLUCOSE 104 (H) 02/18/2015 1818   BUN 12 02/18/2015 1818   CREATININE 0.66 02/18/2015 1818   CALCIUM 9.7 02/18/2015 1818   PROT 8.3 (H) 02/18/2015 1818   ALBUMIN 4.7 02/18/2015 1818   AST 43 (H) 02/18/2015 1818   ALT 35 02/18/2015 1818   ALKPHOS 66 02/18/2015 1818   BILITOT 0.6 02/18/2015 1818   GFRNONAA >60 02/18/2015 1818   GFRAA >60 02/18/2015 1818  No results found for: CHOL, HDL, LDLCALC, LDLDIRECT, TRIG, CHOLHDL No results found for: HGBA1C No results found for: VITAMINB12 No results found for: TSH   ASSESSMENT AND PLAN  81 y.o. year old female with hypertension hyperlipidemia, GERD who is presenting with burning sensation in her lower extremity for the past 2 years left greater than right.  On exam patient have allodynia on the left, decreased vibratory sense left worse than right consistent with peripheral neuropathy.  She is already on gabapentin but reported no improvement and side effect of daytime somnolence.  I will switch her to pregabalin, we will start at 150 twice daily and can increase to 300 twice daily as tolerated.  Patient will contact me in 2 weeks if her symptoms are not resolved.  Other consideration in the future is duloxetine.  I will see her in 6 months for follow-up.   1. Idiopathic peripheral neuropathy     Patient Instructions  Stop Gabapentin  Start Pregabalin 150 mg twice daily  If symptoms not improved after 2 weeks, please contact me, plan will be in increase to 300 mg BID as tolerated  Follow up in 6 months   No orders of the defined types were placed in this encounter.   Meds ordered this encounter  Medications   pregabalin (LYRICA) 150 MG capsule    Sig: Take 1 capsule (150 mg total) by mouth 2 (two) times daily.    Dispense:  60 capsule    Refill:  11     Return in about 6 months (around 11/23/2021).  I have spent a total of 45 minutes dedicated to this patient today, preparing to see patient, performing a medically appropriate examination and evaluation, ordering tests and/or medications and procedures, and counseling and educating the patient/family/caregiver; independently interpreting result and communicating results to the family/patient/caregiver; and documenting clinical information in the electronic medical record.   Alric Ran, MD 05/23/2021, 11:45 AM  [facility] [address], [place], [place] [address] [phone]

## 2021-06-09 ENCOUNTER — Telehealth: Payer: Self-pay | Admitting: Neurology

## 2021-06-09 NOTE — Telephone Encounter (Signed)
Pt has called to report that her lef tlg and foot are still burning, this is with her taking the pregabalin (LYRICA) 150 MG capsule Rx as prescribed ?

## 2021-06-09 NOTE — Telephone Encounter (Signed)
I called patient to discuss. Phone keeps ringing, no VM picks up. ?

## 2021-06-10 MED ORDER — PREGABALIN 300 MG PO CAPS: 300.0000 mg | ORAL_CAPSULE | Freq: Two times a day (BID) | ORAL | 4 refills | Status: DC

## 2021-06-10 NOTE — Telephone Encounter (Signed)
Pt's daughter Elnita Maxwell returned call. Please call back at [phone] ?

## 2021-06-10 NOTE — Addendum Note (Signed)
Addended byAlric Ran on: 06/10/2021 12:19 PM ? ? Modules accepted: Orders ? ?

## 2021-06-10 NOTE — Telephone Encounter (Signed)
I called patient's daughter, Malachy Mood, per DPR. I advised her that the pregabalin 300mg  BID RX has been sent to [facility]. She will let us know if this does not help the patient's symptoms. ?

## 2021-06-10 NOTE — Telephone Encounter (Signed)
Prescription sent to pharmacy.

## 2021-06-10 NOTE — Addendum Note (Signed)
Addended by: Geronimo Running A on: 06/10/2021 11:12 AM ? ? Modules accepted: Orders ? ?

## 2021-06-10 NOTE — Telephone Encounter (Signed)
I called patient's daughter, Elnita Maxwell, per DPR. ? ?Patient's daughter reports that pregabalin 150mg  BID worked well for her when she started it but the burning  in her left leg and foot has gradually returned. She would be agreeable to trying an increase in pregabalin dose to 300mg  BID as recommended by Dr. . She would like the RX to be called into [facility]. ?

## 2021-06-10 NOTE — Telephone Encounter (Signed)
I attempted to reach patient again. No answer, no machine. Her son and daughter are on the [place]. I left messages on their voicemail, asking for the patient to call us back. ? ?Seen by Dr. April Manson on 05/23/21. Per last note: ?She is already on gabapentin but reported no improvement and side effect of daytime somnolence.  I will switch her to pregabalin, we will start at 150 twice daily and can increase to 300 twice daily as tolerated.  Patient will contact me in 2 weeks if her symptoms are not resolved.  ?

## 2021-06-11 DIAGNOSIS — M75101 Unspecified rotator cuff tear or rupture of right shoulder, not specified as traumatic: Secondary | ICD-10-CM | POA: Diagnosis not present

## 2021-09-03 DIAGNOSIS — E7800 Pure hypercholesterolemia, unspecified: Secondary | ICD-10-CM | POA: Diagnosis not present

## 2021-09-03 DIAGNOSIS — Z0000 Encounter for general adult medical examination without abnormal findings: Secondary | ICD-10-CM | POA: Diagnosis not present

## 2021-09-03 DIAGNOSIS — G629 Polyneuropathy, unspecified: Secondary | ICD-10-CM | POA: Diagnosis not present

## 2021-09-03 DIAGNOSIS — K219 Gastro-esophageal reflux disease without esophagitis: Secondary | ICD-10-CM | POA: Diagnosis not present

## 2021-09-03 DIAGNOSIS — I10 Essential (primary) hypertension: Secondary | ICD-10-CM | POA: Diagnosis not present

## 2021-11-11 ENCOUNTER — Telehealth: Payer: Self-pay | Admitting: Neurology

## 2021-11-11 NOTE — Telephone Encounter (Signed)
LVM asking pt to call back to reschedule 9/11 appointment - MD out 

## 2021-12-01 ENCOUNTER — Ambulatory Visit: Payer: Medicare HMO | Admitting: Neurology

## 2021-12-24 ENCOUNTER — Ambulatory Visit: Payer: Medicare HMO | Admitting: Neurology

## 2021-12-24 ENCOUNTER — Encounter: Payer: Self-pay | Admitting: Neurology

## 2021-12-24 VITALS — BP 164/72 | HR 54 | Ht 63.0 in | Wt 145.6 lb

## 2021-12-24 DIAGNOSIS — G609 Hereditary and idiopathic neuropathy, unspecified: Secondary | ICD-10-CM | POA: Diagnosis not present

## 2021-12-24 DIAGNOSIS — R208 Other disturbances of skin sensation: Secondary | ICD-10-CM

## 2021-12-24 MED ORDER — DULOXETINE HCL 60 MG PO CPEP: 60.0000 mg | ORAL_CAPSULE | Freq: Every day | ORAL | 11 refills | Status: DC

## 2021-12-24 NOTE — Progress Notes (Signed)
[facility]  PATIENT: Susan Marsh DOB: 04-Feb-1941  REQUESTING CLINICIAN: [name], MD HISTORY FROM: Patient and daughter  REASON FOR VISIT: Burning sensation in BLEs.   HISTORICAL  CHIEF COMPLAINT:  Chief Complaint  Patient presents with   Follow-up    RM 69 w/ daughter, Elnita Maxwell. Last seen 05/23/21. Neuropathy f/u. Has swelling/burning pain in legs. L worse than R. Some days worse than others. This keeps her up at night. Feels Lyrica has been ineffective. Heat worsens sx. Tries to stay in the house to avoid heat.  Has had 1 fall since last visit in the bathroom. She lost her balance. Denies any injuries from this. Does not remember when this occurred exactly.     INTERVAL HISTORY 12/24/21:  Patient presents today for follow-up, she is accompanied by her daughter.  States that her lower extremities peripheral neuropathy is still the same.  She is on Lyrica up to 300 mg twice daily and has not seen any major improvement.  With the Lyrica she has side effect of somnolence, therefore can go up on the medication.  Again the pain is described as burning tingling, allodynia left worse than right.   HISTORY OF PRESENT ILLNESS:  This is a 81 year old woman with past medical history of hypertension, hyperlipidemia, psoriasis, who is presenting with burning sensation in her lower extremities left greater than right for the past 2 years.  Patient reports pain and burning sensation, worse with at night, stated that the symptoms are worse when touched by the bed sheet or wearing pajama pants at night.  Cannot think of anything that makes it better.  She did follow-up with his primary care doctor who started her on gabapentin, currently taking 300 mg at night and 100 mg in the morning but no improvement of her symptoms.  She cannot go up on the morning dose of gabapentin due to somnolence.  No history of diabetes.    OTHER MEDICAL CONDITIONS: Hypertension,  hyperlipidemia    REVIEW OF SYSTEMS: Full 14 system review of systems performed and negative with exception of: as noted in the HPI   ALLERGIES: Allergies  Allergen Reactions   Alendronate Sodium     Other reaction(s): GI upset   Other     Cortisporin otic Other reaction(s): GI upset    HOME MEDICATIONS: Outpatient Medications Prior to Visit  Medication Sig Dispense Refill   amLODipine (NORVASC) 10 MG tablet Take 1 tablet by mouth daily.     aspirin 81 MG chewable tablet Chew 81 mg by mouth daily.     hydrochlorothiazide (HYDRODIURIL) 25 MG tablet 1 tablet in the morning     lisinopril (PRINIVIL,ZESTRIL) 40 MG tablet Take 40 mg by mouth daily.     LORazepam (ATIVAN) 0.5 MG tablet Take 0.5 mg by mouth 2 (two) times daily as needed. nervousness     meloxicam (MOBIC) 15 MG tablet 1 tablet     metoprolol (LOPRESSOR) 50 MG tablet Take 50 mg by mouth 2 (two) times daily.     pantoprazole (PROTONIX) 40 MG tablet Take 1 tablet by mouth daily.     pravastatin (PRAVACHOL) 40 MG tablet Take 40 mg by mouth daily.     ranitidine (ZANTAC) 150 MG tablet Take 150 mg by mouth 2 (two) times daily as needed. reflux     lisinopril (ZESTRIL) 40 MG tablet daily.     pregabalin (LYRICA) 300 MG capsule Take 1 capsule (300 mg total) by mouth 2 (two) times  daily. 180 capsule 4   No facility-administered medications prior to visit.    PAST MEDICAL HISTORY: Past Medical History:  Diagnosis Date   Aortic stenosis    ASCVD (arteriosclerotic cardiovascular disease)    s/p stent   GERD (gastroesophageal reflux disease)    Hearing loss    R>L   High cholesterol    Hypertension    Osteoarthritis     PAST SURGICAL HISTORY: Past Surgical History:  Procedure Laterality Date   BLADDER SURGERY     CARDIAC SURGERY      FAMILY HISTORY: Family History  Problem Relation Age of Onset   Other Mother        unsure of history   Other Father        unsure of history   Heart disease Brother      SOCIAL HISTORY: Social History   Socioeconomic History   Marital status: Unknown    Spouse name: Not on file   Number of children: 4   Years of education: 12   Highest education level: High school graduate  Occupational History   Occupation: Retired  Tobacco Use   Smoking status: Never   Smokeless tobacco: Not on file  Substance and Sexual Activity   Alcohol use: Never   Drug use: Never   Sexual activity: Not on file  Other Topics Concern   Not on file  Social History Narrative   Lives with son.   Right-handed.   No daily caffeine.   Social Determinants of Health   Financial Resource Strain: Not on file  Food Insecurity: Not on file  Transportation Needs: Not on file  Physical Activity: Not on file  Stress: Not on file  Social Connections: Not on file  Intimate Partner Violence: Not on file    PHYSICAL EXAM  GENERAL EXAM/CONSTITUTIONAL: Vitals:  Vitals:   12/24/21 1118  BP: (!) 164/72  Pulse: (!) 54  Weight: 145 lb 9.6 oz (66 kg)  Height: 5\' 3"  (1.6 m)   Body mass index is 25.79 kg/m. Wt Readings from Last 3 Encounters:  12/24/21 145 lb 9.6 oz (66 kg)  05/23/21 144 lb 8 oz (65.5 kg)  02/18/15 151 lb (68.5 kg)   Patient is in no distress; well developed, nourished and groomed; neck is supple  EYES: Pupils round and reactive to light, Visual fields full to confrontation, Extraocular movements intacts,   MUSCULOSKELETAL: Gait, strength, tone, movements noted in Neurologic exam below  NEUROLOGIC: MENTAL STATUS:      No data to display         awake, alert, oriented to person, place and time recent and remote memory intact normal attention and concentration language fluent, comprehension intact, naming intact fund of knowledge appropriate  CRANIAL NERVE: 2nd, 3rd, 4th, 6th - pupils equal and reactive to light, visual fields full to confrontation, extraocular muscles intact, no nystagmus 5th - facial sensation symmetric 7th - facial  strength symmetric 8th - hearing intact 9th - palate elevates symmetrically, uvula midline 11th - shoulder shrug symmetric 12th - tongue protrusion midline  MOTOR:  normal bulk and tone, full strength in the BUE, BLE  SENSORY:  normal and symmetric to light touch, Increase sensation to pinprick Left leg when compared to right. Decrease  vibration in both lower extremities, left worse than right.  COORDINATION:  finger-nose-finger, fine finger movements normal  REFLEXES:  deep tendon reflexes present and symmetric  GAIT/STATION:  normal   DIAGNOSTIC DATA (LABS, IMAGING, TESTING) - I reviewed patient  records, labs, notes, testing and imaging myself where available.  Lab Results  Component Value Date   WBC 6.8 02/18/2015   HGB 14.7 02/18/2015   HCT 40.8 02/18/2015   MCV 85.9 02/18/2015   PLT 256 02/18/2015      Component Value Date/Time   NA 128 (L) 02/18/2015 1818   K 2.9 (L) 02/18/2015 1818   CL 90 (L) 02/18/2015 1818   CO2 26 02/18/2015 1818   GLUCOSE 104 (H) 02/18/2015 1818   BUN 12 02/18/2015 1818   CREATININE 0.66 02/18/2015 1818   CALCIUM 9.7 02/18/2015 1818   PROT 8.3 (H) 02/18/2015 1818   ALBUMIN 4.7 02/18/2015 1818   AST 43 (H) 02/18/2015 1818   ALT 35 02/18/2015 1818   ALKPHOS 66 02/18/2015 1818   BILITOT 0.6 02/18/2015 1818   GFRNONAA >60 02/18/2015 1818   GFRAA >60 02/18/2015 1818   No results found for: "CHOL", "HDL", "LDLCALC", "LDLDIRECT", "TRIG", "CHOLHDL" No results found for: "HGBA1C" No results found for: "VITAMINB12" No results found for: "TSH"   ASSESSMENT AND PLAN  81 y.o. year old female with hypertension hyperlipidemia, GERD who is presenting for follow-up for her peripheral neuropathy.  Currently she is on Lyrica 300 mg twice daily but denies any major improvement and reports side effects of somnolence.  At this time I am going to add duloxetine 60 mg daily and also gave patient samples of ZTLido. If she reports good control of her  pain with the ZTLido, will send new prescriptions.  Follow-up in 3 months or sooner if worse.    1. Idiopathic peripheral neuropathy   2. Allodynia      Patient Instructions  Continue with pregabalin 300 mg twice daily Start duloxetine 60 mg daily Trial of ZTLido, sample given to patient. If she reports good control of her pain with the ZTLido then we will send new prescription.   Continue to follow with PCP and return in 3 months or sooner if worse.  No orders of the defined types were placed in this encounter.   Meds ordered this encounter  Medications   DULoxetine (CYMBALTA) 60 MG capsule    Sig: Take 1 capsule (60 mg total) by mouth daily.    Dispense:  30 capsule    Refill:  11    Return in about 3 months (around 03/26/2022).    Alric Ran, MD 12/24/2021, 1:22 PM  [facility] [address] [place], [place] [address] [phone]

## 2021-12-24 NOTE — Patient Instructions (Signed)
Continue with pregabalin 300 mg twice daily Start duloxetine 60 mg daily Trial of ZTLido, sample given to patient. If she reports good control of her pain with the ZTLido then we will send new prescription.   Continue to follow with PCP and return in 3 months or sooner if worse.

## 2022-01-09 ENCOUNTER — Other Ambulatory Visit: Payer: Self-pay | Admitting: Neurology

## 2022-01-13 ENCOUNTER — Other Ambulatory Visit: Payer: Self-pay | Admitting: Neurology

## 2022-01-13 ENCOUNTER — Telehealth: Payer: Self-pay | Admitting: Neurology

## 2022-01-13 MED ORDER — ZTLIDO 1.8 % EX PTCH: 1.8000 | MEDICATED_PATCH | CUTANEOUS | 6 refills | Status: DC | PRN

## 2022-01-13 NOTE — Telephone Encounter (Signed)
Pt's daughter, Lorin Glass said DULoxetine (CYMBALTA) 60 MG capsule and pregabalin (LYRICA) 300 MG capsule is not helping.  Having some depression. Would like a call from the nurse.

## 2022-01-13 NOTE — Telephone Encounter (Signed)
Please advise patient to restart Duloxetine and I will send some prescription for ZTLido

## 2022-01-13 NOTE — Telephone Encounter (Signed)
I called patient's daughter, Malachy Mood, per DPR.  I advised her to ask patient to restart duloxetine.  The prescriptions for the ZTLido patches have been sent to [facility] in [place].  I again encouraged patient to discuss her depression management with her PCP as well.  Patient's daughter verbalized understanding of these recommendations.

## 2022-01-13 NOTE — Telephone Encounter (Signed)
I called Malachy Mood, patient's daughter, per DPR.  No answer, left a voicemail asking her to call us back.  If patient calls back another day please route to POD 2.

## 2022-01-13 NOTE — Telephone Encounter (Signed)
Patient's daughter, Malachy Mood, per [facility], returned my call.  She reports that she saw her mother yesterday.  Patient reported that the pregabalin and duloxetine are not helping her neuropathy and therefore she stopped both of these medications.  However, the ZTLido patches are helping her neuropathy and she would like to continue these and is requesting a prescription be sent to [place].    Patient's daughter noticed that patient has been crying more frequently and it seems that her depression has worsened. Per patient's daughter, "She is in a bad spot. This is not like my mother."  I encouraged her daughter to speak with her PCP regarding depression management.

## 2022-01-15 ENCOUNTER — Telehealth: Payer: Self-pay

## 2022-01-15 NOTE — Telephone Encounter (Addendum)
PA Submitted via CMM Key: [id number] Your information has been submitted to [facility]. [employer] will review the request and will issue a decision, typically within 3-7 days from your submission. Awaiting determination

## 2022-01-16 DIAGNOSIS — N390 Urinary tract infection, site not specified: Secondary | ICD-10-CM | POA: Diagnosis not present

## 2022-01-16 DIAGNOSIS — R4182 Altered mental status, unspecified: Secondary | ICD-10-CM | POA: Diagnosis not present

## 2022-01-16 DIAGNOSIS — R112 Nausea with vomiting, unspecified: Secondary | ICD-10-CM | POA: Diagnosis not present

## 2022-01-18 ENCOUNTER — Ambulatory Visit (INDEPENDENT_AMBULATORY_CARE_PROVIDER_SITE_OTHER)
Admission: EM | Admit: 2022-01-18 | Discharge: 2022-01-18 | Disposition: A | Discharge status: Home / Self Care | Payer: Medicare HMO | Source: Home / Self Care

## 2022-01-18 ENCOUNTER — Encounter (HOSPITAL_COMMUNITY): Payer: Self-pay | Admitting: Emergency Medicine

## 2022-01-18 ENCOUNTER — Inpatient Hospital Stay (HOSPITAL_COMMUNITY)
Admission: EM | Admit: 2022-01-18 | Discharge: 2022-01-23 | DRG: 640 | Disposition: A | Discharge status: Home / Self Care | Payer: Medicare HMO | Attending: Internal Medicine | Admitting: Internal Medicine

## 2022-01-18 ENCOUNTER — Other Ambulatory Visit: Payer: Self-pay

## 2022-01-18 DIAGNOSIS — Z7982 Long term (current) use of aspirin: Secondary | ICD-10-CM

## 2022-01-18 DIAGNOSIS — I350 Nonrheumatic aortic (valve) stenosis: Secondary | ICD-10-CM | POA: Diagnosis present

## 2022-01-18 DIAGNOSIS — N179 Acute kidney failure, unspecified: Secondary | ICD-10-CM | POA: Diagnosis not present

## 2022-01-18 DIAGNOSIS — R4182 Altered mental status, unspecified: Secondary | ICD-10-CM | POA: Insufficient documentation

## 2022-01-18 DIAGNOSIS — R441 Visual hallucinations: Secondary | ICD-10-CM | POA: Diagnosis present

## 2022-01-18 DIAGNOSIS — K760 Fatty (change of) liver, not elsewhere classified: Secondary | ICD-10-CM | POA: Diagnosis not present

## 2022-01-18 DIAGNOSIS — Z8249 Family history of ischemic heart disease and other diseases of the circulatory system: Secondary | ICD-10-CM

## 2022-01-18 DIAGNOSIS — F419 Anxiety disorder, unspecified: Secondary | ICD-10-CM | POA: Diagnosis present

## 2022-01-18 DIAGNOSIS — Z888 Allergy status to other drugs, medicaments and biological substances status: Secondary | ICD-10-CM | POA: Diagnosis not present

## 2022-01-18 DIAGNOSIS — F331 Major depressive disorder, recurrent, moderate: Secondary | ICD-10-CM

## 2022-01-18 DIAGNOSIS — E871 Hypo-osmolality and hyponatremia: Secondary | ICD-10-CM | POA: Diagnosis not present

## 2022-01-18 DIAGNOSIS — K8020 Calculus of gallbladder without cholecystitis without obstruction: Secondary | ICD-10-CM | POA: Diagnosis present

## 2022-01-18 DIAGNOSIS — Z791 Long term (current) use of non-steroidal anti-inflammatories (NSAID): Secondary | ICD-10-CM | POA: Diagnosis not present

## 2022-01-18 DIAGNOSIS — H9193 Unspecified hearing loss, bilateral: Secondary | ICD-10-CM | POA: Diagnosis present

## 2022-01-18 DIAGNOSIS — Z79899 Other long term (current) drug therapy: Secondary | ICD-10-CM | POA: Diagnosis not present

## 2022-01-18 DIAGNOSIS — I2510 Atherosclerotic heart disease of native coronary artery without angina pectoris: Secondary | ICD-10-CM | POA: Diagnosis present

## 2022-01-18 DIAGNOSIS — G9341 Metabolic encephalopathy: Secondary | ICD-10-CM | POA: Diagnosis present

## 2022-01-18 DIAGNOSIS — H9190 Unspecified hearing loss, unspecified ear: Secondary | ICD-10-CM | POA: Insufficient documentation

## 2022-01-18 DIAGNOSIS — Z955 Presence of coronary angioplasty implant and graft: Secondary | ICD-10-CM | POA: Diagnosis not present

## 2022-01-18 DIAGNOSIS — E7800 Pure hypercholesterolemia, unspecified: Secondary | ICD-10-CM | POA: Diagnosis present

## 2022-01-18 DIAGNOSIS — K219 Gastro-esophageal reflux disease without esophagitis: Secondary | ICD-10-CM | POA: Diagnosis present

## 2022-01-18 DIAGNOSIS — E860 Dehydration: Secondary | ICD-10-CM | POA: Diagnosis not present

## 2022-01-18 DIAGNOSIS — T43215A Adverse effect of selective serotonin and norepinephrine reuptake inhibitors, initial encounter: Secondary | ICD-10-CM | POA: Diagnosis present

## 2022-01-18 DIAGNOSIS — T502X5A Adverse effect of carbonic-anhydrase inhibitors, benzothiadiazides and other diuretics, initial encounter: Secondary | ICD-10-CM | POA: Diagnosis present

## 2022-01-18 DIAGNOSIS — E861 Hypovolemia: Secondary | ICD-10-CM | POA: Diagnosis present

## 2022-01-18 DIAGNOSIS — E876 Hypokalemia: Secondary | ICD-10-CM | POA: Diagnosis not present

## 2022-01-18 DIAGNOSIS — I10 Essential (primary) hypertension: Secondary | ICD-10-CM | POA: Diagnosis not present

## 2022-01-18 DIAGNOSIS — E878 Other disorders of electrolyte and fluid balance, not elsewhere classified: Secondary | ICD-10-CM

## 2022-01-18 DIAGNOSIS — R000 Tachycardia, unspecified: Secondary | ICD-10-CM | POA: Diagnosis not present

## 2022-01-18 DIAGNOSIS — R079 Chest pain, unspecified: Secondary | ICD-10-CM | POA: Diagnosis not present

## 2022-01-18 DIAGNOSIS — M1990 Unspecified osteoarthritis, unspecified site: Secondary | ICD-10-CM | POA: Diagnosis present

## 2022-01-18 DIAGNOSIS — G9340 Encephalopathy, unspecified: Secondary | ICD-10-CM

## 2022-01-18 DIAGNOSIS — R443 Hallucinations, unspecified: Secondary | ICD-10-CM | POA: Diagnosis not present

## 2022-01-18 LAB — COMPREHENSIVE METABOLIC PANEL
ALT: 28 U/L (ref 0–44)
AST: 45 U/L — ABNORMAL HIGH (ref 15–41)
Albumin: 4.2 g/dL (ref 3.5–5.0)
Alkaline Phosphatase: 66 U/L (ref 38–126)
Anion gap: 22 — ABNORMAL HIGH (ref 5–15)
BUN: 49 mg/dL — ABNORMAL HIGH (ref 8–23)
CO2: 28 mmol/L (ref 22–32)
Calcium: 9.9 mg/dL (ref 8.9–10.3)
Chloride: 68 mmol/L — ABNORMAL LOW (ref 98–111)
Creatinine, Ser: 2.64 mg/dL — ABNORMAL HIGH (ref 0.44–1.00)
GFR, Estimated: 18 mL/min — ABNORMAL LOW (ref >60)
Glucose, Bld: 127 mg/dL — ABNORMAL HIGH (ref 70–99)
Potassium: <2 mmol/L — CL (ref 3.5–5.1)
Sodium: 118 mmol/L — CL (ref 135–145)
Total Bilirubin: 1 mg/dL (ref 0.3–1.2)
Total Protein: 8.2 g/dL — ABNORMAL HIGH (ref 6.5–8.1)

## 2022-01-18 LAB — CBC
HCT: 44.1 % (ref 36.0–46.0)
Hemoglobin: 16.1 g/dL — ABNORMAL HIGH (ref 12.0–15.0)
MCH: 30 pg (ref 26.0–34.0)
MCHC: 36.5 g/dL — ABNORMAL HIGH (ref 30.0–36.0)
MCV: 82.1 fL (ref 80.0–100.0)
Platelets: 295 10*3/uL (ref 150–400)
RBC: 5.37 MIL/uL — ABNORMAL HIGH (ref 3.87–5.11)
RDW: 12.5 % (ref 11.5–15.5)
WBC: 11.3 10*3/uL — ABNORMAL HIGH (ref 4.0–10.5)
nRBC: 0 % (ref 0.0–0.2)

## 2022-01-18 LAB — BASIC METABOLIC PANEL
Anion gap: 21 — ABNORMAL HIGH (ref 5–15)
BUN: 50 mg/dL — ABNORMAL HIGH (ref 8–23)
CO2: 26 mmol/L (ref 22–32)
Calcium: 9.9 mg/dL (ref 8.9–10.3)
Chloride: 70 mmol/L — ABNORMAL LOW (ref 98–111)
Creatinine, Ser: 2.73 mg/dL — ABNORMAL HIGH (ref 0.44–1.00)
GFR, Estimated: 17 mL/min — ABNORMAL LOW (ref >60)
Glucose, Bld: 121 mg/dL — ABNORMAL HIGH (ref 70–99)
Potassium: 2.4 mmol/L — CL (ref 3.5–5.1)
Sodium: 117 mmol/L — CL (ref 135–145)

## 2022-01-18 LAB — MAGNESIUM: Magnesium: 1.4 mg/dL — ABNORMAL LOW (ref 1.7–2.4)

## 2022-01-18 NOTE — ED Notes (Signed)
[place] MD aware of CMP results, new orders for repeat BMP and ekg placed.

## 2022-01-18 NOTE — ED Triage Notes (Signed)
Pt here from home with daughter was seen at [facility] and told to follow up outpt , no si or hi but  has been very anxious and nervous and does not want to go back home , no t eating ,alert and oriented in triage

## 2022-01-18 NOTE — Discharge Instructions (Addendum)
Patient encouraged to keep all scheduled follow up appointments. Patient to be provided resources for outpatient psychiatry

## 2022-01-18 NOTE — ED Provider Notes (Signed)
[facility] Medical Screening Exam  Patient Name: ELIANYS CONRY MRN: [id number] Date of Evaluation: 01/18/22 Chief Complaint:   Diagnosis:  Final diagnoses:  Moderate episode of recurrent major depressive disorder (HCC)    History of Present illness: Alizea L Holliman is a 81 y.o. female with a past psychiatric history significant for depression who presents to [facility], accompanied by her two daughters, due to altered mental status.  Due to patient being hard of hearing, the majority of patient's history was provided by patient's daughters.  Per patient's daughters, patient lives with their brother and manages her current medications on her own.  They report that the patient was taking pregabalin and Cymbalta for the management of her neuropathy 3 weeks ago before discontinuing the medication due to not liking how the medications made her feel.  Sometime after discontinuing her medications, patient's daughters state that the patient has been seeing things.  They report that the last time the patient experienced visual hallucinations was on Friday when she saw bugs on the ceiling.  They note that a bug was on the patient prior to her visual hallucinations characterized by seeing bugs.    Patient reports that bugs are in the house and she is unable to get away from them.  She reports that she was bit near her groin region and endorses itching. She reports that she is unable to sleep and her whole body is out of shape. She states that she does not want to go back home and that she gets so nervous that she is unable to sleep. Patient was observed stating "Don't take me back home, I will die. I will walk away from home." Patient's daughter state that the patient has been dealing with depression since 1999 and has a past history significant for nervous breakdown due to closing down of her work place.  Patient has not past history of  hospitalization due to mental health. Patient's daughters state that the patient does not have a past history of suicide attempt nor does she have a past history of self-harm. Patient denies suicidal or homicidal ideations. Patient denies active auditory or visual hallucinations and does not appear to be responding to internal/external stimuli. Patient endorses poor sleep and her daughters state that she has been having terrible appetite. Per patient's daughters, patient does not have a past history significant for alcohol, tobacco, or illicit substance use.  Patient is casually dressed, alert and orient x4. Patient is mostly calm, cooperative, and engages in conversation. Patient is mildly distressed when expressing she does not want to go back home. Patient maintains fair eye contact. Patient's speech is clear, coherent, and with normal rate. Patient's process is fluent and organized. Her thought content is normal. Patient exhibits mild depression and anxiety with congruent affect. Patient does not appear to be responding to internal/external stimuli.  Psychiatric Specialty Exam  Presentation  General Appearance:Appropriate for Environment; Casual  Eye Contact:Fair  Speech:Clear and Coherent; Other (comment) (Majority of the history provided by patient's daughters)  Speech Volume:Normal  Handedness:Right   Mood and Affect  Mood: Anxious; Depressed  Affect: Congruent   Thought Process  Thought Processes: Coherent  Descriptions of Associations:Intact  Orientation:Full (Time, Place and Person)  Thought Content:Logical    Hallucinations:None  Ideas of Reference:None  Suicidal Thoughts:No  Homicidal Thoughts:No   Sensorium  Memory: Immediate Fair; Recent Fair; Remote Fair  Judgment: Good  Insight: Good   Executive Functions  Concentration: Fair  Attention Span: Fair  Recall: [employer] of Knowledge: Fair  Language: Fair   Psychomotor Activity   Psychomotor Activity: Normal   Assets  Assets: Armed forces logistics/support/administrative officer; Desire for Improvement; Social Support; Housing   Sleep  Sleep: Poor  Number of hours: No data recorded  No data recorded  Physical Exam: Physical Exam Constitutional:      Appearance: Normal appearance.  HENT:     Head: Normocephalic and atraumatic.     Nose: Nose normal.     Mouth/Throat:     Mouth: Mucous membranes are moist.  Eyes:     Extraocular Movements: Extraocular movements intact.     Pupils: Pupils are equal, round, and reactive to light.  Cardiovascular:     Rate and Rhythm: Normal rate and regular rhythm.  Pulmonary:     Effort: Pulmonary effort is normal.     Breath sounds: Normal breath sounds.  Abdominal:     General: Abdomen is flat.  Musculoskeletal:        General: Normal range of motion.     Cervical back: Normal range of motion and neck supple.  Skin:    General: Skin is warm and dry.  Neurological:     General: No focal deficit present.     Mental Status: She is alert and oriented to person, place, and time.  Psychiatric:        Attention and Perception: Attention and perception normal. She does not perceive auditory or visual hallucinations.        Mood and Affect: Mood is anxious and depressed.        Speech: Speech normal.        Behavior: Behavior normal. Behavior is cooperative.        Thought Content: Thought content normal. Thought content is not paranoid or delusional. Thought content does not include homicidal or suicidal ideation.        Cognition and Memory: Cognition and memory normal.        Judgment: Judgment normal.    Review of Systems  Constitutional: Negative.   HENT: Negative.    Eyes: Negative.   Respiratory: Negative.    Cardiovascular: Negative.   Gastrointestinal: Negative.   Skin: Negative.   Psychiatric/Behavioral:  Positive for depression. Negative for hallucinations, substance abuse and suicidal ideas. The patient is nervous/anxious.  The patient does not have insomnia.    Blood pressure 127/79, pulse 100, temperature 97.9 F (36.6 C), temperature source Oral, resp. rate 16, SpO2 98 %. There is no height or weight on file to calculate BMI.  Musculoskeletal: Strength & Muscle Tone: within normal limits Gait & Station: normal Patient leans: N/A   [facility] MSE Discharge Disposition for Follow up and Recommendations: Based on my evaluation the patient does not appear to have an emergency medical condition and can be discharged with resources and follow up care in outpatient services for Medication Management. Patient is denying suicidal or homicidal ideations. She further denies auditory or visual hallucinations and does not appear to be responding to internal/external stimuli.   Malachy Mood, PA 01/18/2022, 3:46 PM

## 2022-01-18 NOTE — ED Notes (Signed)
Patient discharged by provider Ludwig Clarks, [place]. With written and verbal instructions.

## 2022-01-19 ENCOUNTER — Other Ambulatory Visit: Payer: Self-pay

## 2022-01-19 DIAGNOSIS — E860 Dehydration: Secondary | ICD-10-CM | POA: Diagnosis not present

## 2022-01-19 DIAGNOSIS — T502X5A Adverse effect of carbonic-anhydrase inhibitors, benzothiadiazides and other diuretics, initial encounter: Secondary | ICD-10-CM | POA: Diagnosis not present

## 2022-01-19 DIAGNOSIS — E878 Other disorders of electrolyte and fluid balance, not elsewhere classified: Secondary | ICD-10-CM | POA: Diagnosis not present

## 2022-01-19 DIAGNOSIS — G9341 Metabolic encephalopathy: Secondary | ICD-10-CM | POA: Diagnosis not present

## 2022-01-19 DIAGNOSIS — F419 Anxiety disorder, unspecified: Secondary | ICD-10-CM | POA: Diagnosis present

## 2022-01-19 DIAGNOSIS — I10 Essential (primary) hypertension: Secondary | ICD-10-CM | POA: Diagnosis not present

## 2022-01-19 DIAGNOSIS — N179 Acute kidney failure, unspecified: Secondary | ICD-10-CM | POA: Diagnosis not present

## 2022-01-19 DIAGNOSIS — I350 Nonrheumatic aortic (valve) stenosis: Secondary | ICD-10-CM | POA: Diagnosis not present

## 2022-01-19 DIAGNOSIS — E876 Hypokalemia: Secondary | ICD-10-CM | POA: Diagnosis not present

## 2022-01-19 DIAGNOSIS — R443 Hallucinations, unspecified: Secondary | ICD-10-CM | POA: Diagnosis not present

## 2022-01-19 DIAGNOSIS — Z955 Presence of coronary angioplasty implant and graft: Secondary | ICD-10-CM | POA: Diagnosis not present

## 2022-01-19 DIAGNOSIS — I2510 Atherosclerotic heart disease of native coronary artery without angina pectoris: Secondary | ICD-10-CM | POA: Diagnosis present

## 2022-01-19 DIAGNOSIS — Z8249 Family history of ischemic heart disease and other diseases of the circulatory system: Secondary | ICD-10-CM | POA: Diagnosis not present

## 2022-01-19 DIAGNOSIS — E871 Hypo-osmolality and hyponatremia: Secondary | ICD-10-CM | POA: Diagnosis present

## 2022-01-19 DIAGNOSIS — K760 Fatty (change of) liver, not elsewhere classified: Secondary | ICD-10-CM | POA: Diagnosis not present

## 2022-01-19 DIAGNOSIS — H9193 Unspecified hearing loss, bilateral: Secondary | ICD-10-CM | POA: Diagnosis present

## 2022-01-19 DIAGNOSIS — M1990 Unspecified osteoarthritis, unspecified site: Secondary | ICD-10-CM | POA: Diagnosis present

## 2022-01-19 DIAGNOSIS — Z7982 Long term (current) use of aspirin: Secondary | ICD-10-CM | POA: Diagnosis not present

## 2022-01-19 DIAGNOSIS — Z79899 Other long term (current) drug therapy: Secondary | ICD-10-CM | POA: Diagnosis not present

## 2022-01-19 DIAGNOSIS — Z888 Allergy status to other drugs, medicaments and biological substances status: Secondary | ICD-10-CM | POA: Diagnosis not present

## 2022-01-19 DIAGNOSIS — K8020 Calculus of gallbladder without cholecystitis without obstruction: Secondary | ICD-10-CM | POA: Diagnosis present

## 2022-01-19 DIAGNOSIS — E7800 Pure hypercholesterolemia, unspecified: Secondary | ICD-10-CM | POA: Diagnosis not present

## 2022-01-19 DIAGNOSIS — R079 Chest pain, unspecified: Secondary | ICD-10-CM | POA: Diagnosis not present

## 2022-01-19 DIAGNOSIS — R441 Visual hallucinations: Secondary | ICD-10-CM | POA: Diagnosis present

## 2022-01-19 DIAGNOSIS — K219 Gastro-esophageal reflux disease without esophagitis: Secondary | ICD-10-CM | POA: Diagnosis present

## 2022-01-19 DIAGNOSIS — F331 Major depressive disorder, recurrent, moderate: Secondary | ICD-10-CM | POA: Diagnosis not present

## 2022-01-19 DIAGNOSIS — Z791 Long term (current) use of non-steroidal anti-inflammatories (NSAID): Secondary | ICD-10-CM | POA: Diagnosis not present

## 2022-01-19 LAB — BASIC METABOLIC PANEL
Anion gap: 20 — ABNORMAL HIGH (ref 5–15)
Anion gap: 15 (ref 5–15)
Anion gap: 16 — ABNORMAL HIGH (ref 5–15)
Anion gap: 16 — ABNORMAL HIGH (ref 5–15)
Anion gap: 14 (ref 5–15)
Anion gap: 15 (ref 5–15)
Anion gap: 12 (ref 5–15)
Anion gap: 11 (ref 5–15)
BUN: 48 mg/dL — ABNORMAL HIGH (ref 8–23)
BUN: 44 mg/dL — ABNORMAL HIGH (ref 8–23)
BUN: 45 mg/dL — ABNORMAL HIGH (ref 8–23)
BUN: 42 mg/dL — ABNORMAL HIGH (ref 8–23)
BUN: 41 mg/dL — ABNORMAL HIGH (ref 8–23)
BUN: 42 mg/dL — ABNORMAL HIGH (ref 8–23)
BUN: 41 mg/dL — ABNORMAL HIGH (ref 8–23)
BUN: 40 mg/dL — ABNORMAL HIGH (ref 8–23)
CO2: 25 mmol/L (ref 22–32)
CO2: 26 mmol/L (ref 22–32)
CO2: 27 mmol/L (ref 22–32)
CO2: 27 mmol/L (ref 22–32)
CO2: 27 mmol/L (ref 22–32)
CO2: 26 mmol/L (ref 22–32)
CO2: 26 mmol/L (ref 22–32)
CO2: 25 mmol/L (ref 22–32)
Calcium: 9.2 mg/dL (ref 8.9–10.3)
Calcium: 8.8 mg/dL — ABNORMAL LOW (ref 8.9–10.3)
Calcium: 9.5 mg/dL (ref 8.9–10.3)
Calcium: 9.4 mg/dL (ref 8.9–10.3)
Calcium: 9.4 mg/dL (ref 8.9–10.3)
Calcium: 9.3 mg/dL (ref 8.9–10.3)
Calcium: 9.3 mg/dL (ref 8.9–10.3)
Calcium: 9.2 mg/dL (ref 8.9–10.3)
Chloride: 73 mmol/L — ABNORMAL LOW (ref 98–111)
Chloride: 81 mmol/L — ABNORMAL LOW (ref 98–111)
Chloride: 78 mmol/L — ABNORMAL LOW (ref 98–111)
Chloride: 80 mmol/L — ABNORMAL LOW (ref 98–111)
Chloride: 82 mmol/L — ABNORMAL LOW (ref 98–111)
Chloride: 83 mmol/L — ABNORMAL LOW (ref 98–111)
Chloride: 85 mmol/L — ABNORMAL LOW (ref 98–111)
Chloride: 87 mmol/L — ABNORMAL LOW (ref 98–111)
Creatinine, Ser: 2.27 mg/dL — ABNORMAL HIGH (ref 0.44–1.00)
Creatinine, Ser: 2.1 mg/dL — ABNORMAL HIGH (ref 0.44–1.00)
Creatinine, Ser: 2.1 mg/dL — ABNORMAL HIGH (ref 0.44–1.00)
Creatinine, Ser: 1.86 mg/dL — ABNORMAL HIGH (ref 0.44–1.00)
Creatinine, Ser: 1.88 mg/dL — ABNORMAL HIGH (ref 0.44–1.00)
Creatinine, Ser: 1.94 mg/dL — ABNORMAL HIGH (ref 0.44–1.00)
Creatinine, Ser: 1.85 mg/dL — ABNORMAL HIGH (ref 0.44–1.00)
Creatinine, Ser: 1.71 mg/dL — ABNORMAL HIGH (ref 0.44–1.00)
GFR, Estimated: 21 mL/min — ABNORMAL LOW (ref >60)
GFR, Estimated: 23 mL/min — ABNORMAL LOW (ref >60)
GFR, Estimated: 23 mL/min — ABNORMAL LOW (ref >60)
GFR, Estimated: 27 mL/min — ABNORMAL LOW (ref >60)
GFR, Estimated: 27 mL/min — ABNORMAL LOW (ref >60)
GFR, Estimated: 26 mL/min — ABNORMAL LOW (ref >60)
GFR, Estimated: 27 mL/min — ABNORMAL LOW (ref >60)
GFR, Estimated: 30 mL/min — ABNORMAL LOW (ref >60)
Glucose, Bld: 116 mg/dL — ABNORMAL HIGH (ref 70–99)
Glucose, Bld: 99 mg/dL (ref 70–99)
Glucose, Bld: 111 mg/dL — ABNORMAL HIGH (ref 70–99)
Glucose, Bld: 98 mg/dL (ref 70–99)
Glucose, Bld: 101 mg/dL — ABNORMAL HIGH (ref 70–99)
Glucose, Bld: 100 mg/dL — ABNORMAL HIGH (ref 70–99)
Glucose, Bld: 117 mg/dL — ABNORMAL HIGH (ref 70–99)
Glucose, Bld: 105 mg/dL — ABNORMAL HIGH (ref 70–99)
Potassium: 3.2 mmol/L — ABNORMAL LOW (ref 3.5–5.1)
Potassium: 2.6 mmol/L — CL (ref 3.5–5.1)
Potassium: 3.3 mmol/L — ABNORMAL LOW (ref 3.5–5.1)
Potassium: 4.5 mmol/L (ref 3.5–5.1)
Potassium: 3.6 mmol/L (ref 3.5–5.1)
Potassium: 3.7 mmol/L (ref 3.5–5.1)
Potassium: 3.6 mmol/L (ref 3.5–5.1)
Potassium: 3.4 mmol/L — ABNORMAL LOW (ref 3.5–5.1)
Sodium: 118 mmol/L — CL (ref 135–145)
Sodium: 122 mmol/L — ABNORMAL LOW (ref 135–145)
Sodium: 121 mmol/L — ABNORMAL LOW (ref 135–145)
Sodium: 123 mmol/L — ABNORMAL LOW (ref 135–145)
Sodium: 123 mmol/L — ABNORMAL LOW (ref 135–145)
Sodium: 124 mmol/L — ABNORMAL LOW (ref 135–145)
Sodium: 123 mmol/L — ABNORMAL LOW (ref 135–145)
Sodium: 123 mmol/L — ABNORMAL LOW (ref 135–145)

## 2022-01-19 LAB — RAPID URINE DRUG SCREEN, HOSP PERFORMED
Amphetamines: NOT DETECTED
Barbiturates: NOT DETECTED
Benzodiazepines: NOT DETECTED
Cocaine: NOT DETECTED
Opiates: NOT DETECTED
Tetrahydrocannabinol: NOT DETECTED

## 2022-01-19 LAB — CBC WITH DIFFERENTIAL/PLATELET
Abs Immature Granulocytes: 0.04 10*3/uL (ref 0.00–0.07)
Basophils Absolute: 0 10*3/uL (ref 0.0–0.1)
Basophils Relative: 0 %
Eosinophils Absolute: 2.1 10*3/uL — ABNORMAL HIGH (ref 0.0–0.5)
Eosinophils Relative: 17 %
HCT: 45.3 % (ref 36.0–46.0)
Hemoglobin: 17 g/dL — ABNORMAL HIGH (ref 12.0–15.0)
Immature Granulocytes: 0 %
Lymphocytes Relative: 20 %
Lymphs Abs: 2.4 10*3/uL (ref 0.7–4.0)
MCH: 30.5 pg (ref 26.0–34.0)
MCHC: 37.5 g/dL — ABNORMAL HIGH (ref 30.0–36.0)
MCV: 81.3 fL (ref 80.0–100.0)
Monocytes Absolute: 1 10*3/uL (ref 0.1–1.0)
Monocytes Relative: 8 %
Neutro Abs: 6.7 10*3/uL (ref 1.7–7.7)
Neutrophils Relative %: 55 %
Platelets: 232 10*3/uL (ref 150–400)
RBC: 5.57 MIL/uL — ABNORMAL HIGH (ref 3.87–5.11)
RDW: 12.4 % (ref 11.5–15.5)
WBC: 12.2 10*3/uL — ABNORMAL HIGH (ref 4.0–10.5)
nRBC: 0 % (ref 0.0–0.2)

## 2022-01-19 LAB — TSH: TSH: 0.91 u[IU]/mL (ref 0.350–4.500)

## 2022-01-19 LAB — URINALYSIS, ROUTINE W REFLEX MICROSCOPIC
Bilirubin Urine: NEGATIVE
Glucose, UA: NEGATIVE mg/dL
Hgb urine dipstick: NEGATIVE
Ketones, ur: NEGATIVE mg/dL
Nitrite: NEGATIVE
Protein, ur: NEGATIVE mg/dL
Specific Gravity, Urine: 1.008 (ref 1.005–1.030)
pH: 5 (ref 5.0–8.0)

## 2022-01-19 LAB — I-STAT VENOUS BLOOD GAS, ED
Acid-Base Excess: 5 mmol/L — ABNORMAL HIGH (ref 0.0–2.0)
Bicarbonate: 28.4 mmol/L — ABNORMAL HIGH (ref 20.0–28.0)
Calcium, Ion: 1.01 mmol/L — ABNORMAL LOW (ref 1.15–1.40)
HCT: 42 % (ref 36.0–46.0)
Hemoglobin: 14.3 g/dL (ref 12.0–15.0)
O2 Saturation: 53 %
Potassium: 2.3 mmol/L — CL (ref 3.5–5.1)
Sodium: 115 mmol/L — CL (ref 135–145)
TCO2: 30 mmol/L (ref 22–32)
pCO2, Ven: 36.9 mmHg — ABNORMAL LOW (ref 44–60)
pH, Ven: 7.494 — ABNORMAL HIGH (ref 7.25–7.43)
pO2, Ven: 25 mmHg — CL (ref 32–45)

## 2022-01-19 LAB — VOLATILES,BLD-ACETONE,ETHANOL,ISOPROP,METHANOL
Acetone, blood: <0.01 g/dL (ref 0.000–0.010)
Ethanol, blood: <0.01 g/dL (ref 0.000–0.010)
Isopropanol, blood: <0.01 g/dL (ref 0.000–0.010)
Methanol, blood: <0.01 g/dL (ref 0.000–0.010)

## 2022-01-19 LAB — OSMOLALITY: Osmolality: 263 mOsm/kg — ABNORMAL LOW (ref 275–295)

## 2022-01-19 LAB — SODIUM, URINE, RANDOM: Sodium, Ur: 33 mmol/L

## 2022-01-19 LAB — MAGNESIUM: Magnesium: 1.9 mg/dL (ref 1.7–2.4)

## 2022-01-19 LAB — OSMOLALITY, URINE: Osmolality, Ur: 293 mOsm/kg — ABNORMAL LOW (ref 300–900)

## 2022-01-19 LAB — LACTIC ACID, PLASMA
Lactic Acid, Venous: 2.3 mmol/L (ref 0.5–1.9)
Lactic Acid, Venous: 1.6 mmol/L (ref 0.5–1.9)

## 2022-01-19 LAB — PROCALCITONIN: Procalcitonin: 0.43 ng/mL

## 2022-01-19 LAB — ETHANOL: Alcohol, Ethyl (B): <10 mg/dL (ref <10)

## 2022-01-19 MED ORDER — LORAZEPAM 1 MG PO TABS
1.0000 mg | ORAL_TABLET | Freq: Once | ORAL | Status: AC
  Administered 2022-01-19: 1 mg via ORAL
  Filled 2022-01-19: qty 1

## 2022-01-19 MED ORDER — POTASSIUM CHLORIDE 10 MEQ/100ML IV SOLN
10.0000 meq | INTRAVENOUS | Status: AC
  Administered 2022-01-19 (×3): 10 meq via INTRAVENOUS
  Filled 2022-01-19 (×3): qty 100

## 2022-01-19 MED ORDER — LACTATED RINGERS IV BOLUS
1000.0000 mL | Freq: Once | INTRAVENOUS | Status: AC
  Administered 2022-01-19: 1000 mL via INTRAVENOUS

## 2022-01-19 MED ORDER — POTASSIUM CHLORIDE CRYS ER 20 MEQ PO TBCR
40.0000 meq | EXTENDED_RELEASE_TABLET | Freq: Once | ORAL | Status: AC
  Administered 2022-01-19: 40 meq via ORAL
  Filled 2022-01-19: qty 2

## 2022-01-19 MED ORDER — POTASSIUM CHLORIDE 10 MEQ/100ML IV SOLN
10.0000 meq | INTRAVENOUS | Status: AC
  Administered 2022-01-19 (×2): 10 meq via INTRAVENOUS
  Filled 2022-01-19 (×2): qty 100

## 2022-01-19 MED ORDER — ENOXAPARIN SODIUM 40 MG/0.4ML IJ SOSY
40.0000 mg | PREFILLED_SYRINGE | INTRAMUSCULAR | Status: DC
  Administered 2022-01-20 – 2022-01-22 (×4): 40 mg via SUBCUTANEOUS
  Filled 2022-01-19 (×4): qty 0.4

## 2022-01-19 MED ORDER — MAGNESIUM OXIDE -MG SUPPLEMENT 400 (240 MG) MG PO TABS
800.0000 mg | ORAL_TABLET | Freq: Once | ORAL | Status: AC
  Administered 2022-01-19: 800 mg via ORAL
  Filled 2022-01-19: qty 2

## 2022-01-19 MED ORDER — ALUM & MAG HYDROXIDE-SIMETH 200-200-20 MG/5ML PO SUSP
30.0000 mL | ORAL | Status: DC | PRN
  Administered 2022-01-19: 30 mL via ORAL
  Filled 2022-01-19: qty 30

## 2022-01-19 MED ORDER — MAGNESIUM SULFATE 2 GM/50ML IV SOLN
2.0000 g | Freq: Once | INTRAVENOUS | Status: AC
  Administered 2022-01-19: 2 g via INTRAVENOUS
  Filled 2022-01-19: qty 50

## 2022-01-19 NOTE — ED Notes (Signed)
RN and NT assisted pt in bed.

## 2022-01-19 NOTE — Telephone Encounter (Signed)
Deniedon October 28 You asked for the drug above for your Hereditary and idiopathic neuropathy, unspecified and Other disturbances of skin sensation. This is an off-label use that is not medically accepted. The Medicare rule in the Prescription Drug Benefit Manual (Chapter 6, Section 10.6) says a drug must be used for a medically accepted indication (covered use). Off-label use is medically accepted when there is proof in one or more of the drug guides that the drug works for your condition. We look at the two major drug guides (compendia): the [place] and the [place] (AHFS-DI). [employer] has decided that there is no proof in either drug guide that this drug works for your condition. Per Medicare rules, it is not covered.

## 2022-01-19 NOTE — ED Notes (Signed)
Talked with charge nurse about patient going into room due to critical labs. Will continue to monitor

## 2022-01-19 NOTE — ED Notes (Signed)
Pt up to [facility] with one assist. Urine sample collected

## 2022-01-19 NOTE — ED Notes (Signed)
Pt family states pt is hard of hearing and left her hearing aids at home. Family stated pt will nod head yes as if she can here sometimes but she is AOx4

## 2022-01-19 NOTE — Consult Note (Signed)
[facility]  HISTORY AND PHYSICAL  Susan Marsh is an 81 y.o. female.    Chief Complaint: visual hallucination and erratic behavior  HPI: Pt is an 14 F with a PMH sig for HTN, HLD, arthritis, hard of hearing, and aortic stenosis who is now seen in consultation at the request of Dr. Sharl Ma for evaluation and recommendations surrounding hyponatremia and electrolyte derangements.    Pt was behaving erratically for the past few days- dtr became concerned, took her to behavioral health where she was discharged.  Was afraid to go home- so came to [facility] ED.  Noted that she had Na of 117, K of < 2.0, Mg of 1.4, and Cr of 2.64.    Was given 1L LR and K and Mg replacement.  This AM Na is 122 and K is 2.6.  In this setting we are asked to see.   Pt is hard of hearing but says that her daughter brought her here.  Notes that she's had a cold for the past week and that she didn't even want to go out to eat with her daughter last Saturday- something they do together.  Has been taking her meds but not sure what they are- lisinopril, HCTZ, and mobic listed on med list.  She is AAO today although the conversation is somewhat tangential.  No reported seizure activity.    PMH: Past Medical History:  Diagnosis Date   Aortic stenosis    ASCVD (arteriosclerotic cardiovascular disease)    s/p stent   GERD (gastroesophageal reflux disease)    Hearing loss    R>L   High cholesterol    Hypertension    Osteoarthritis    PSH: Past Surgical History:  Procedure Laterality Date   BLADDER SURGERY     CARDIAC SURGERY       Past Medical History:  Diagnosis Date   Aortic stenosis    ASCVD (arteriosclerotic cardiovascular disease)    s/p stent   GERD (gastroesophageal reflux disease)    Hearing loss    R>L   High cholesterol    Hypertension    Osteoarthritis     Medications: Lisinopril 40 mg daily HCTZ 25 mg daily Mobic 15 mg prn daily Amlodipine 10 mg daily Klonopin 0.5 mg  daily Metoprolol 50 mg BID PPI Pravastatin 40 mg daily ASA 81 mg daily Cymbalta 60 mg daily ? macrobid  (Not in a hospital admission)   ALLERGIES:   Allergies  Allergen Reactions   Alendronate Sodium     Other reaction(s): GI upset   Other     Cortisporin otic Other reaction(s): GI upset    FAM HX: Family History  Problem Relation Age of Onset   Other Mother        unsure of history   Other Father        unsure of history   Heart disease Brother     Social History:   reports that she has never smoked. She does not have any smokeless tobacco history on file. She reports that she does not drink alcohol and does not use drugs.  ROS: ROS: all other systems reviewed and are negative except as per HPI  Blood pressure 114/66, pulse (!) 107, temperature 98.3 F (36.8 C), temperature source Oral, resp. rate 17, SpO2 99 %. PHYSICAL EXAM: Physical Exam GEN NAD, lying in bed HEENT EOMI PERRL very hard of hearing- looks like she's reading my lips a little NECK flat neck veins PULM clear CV RRR  ABD soft EXT no LE edema NEURO AAO but somewhat tangential conversation SKIN warm and dry MSK  no effusions  Results for orders placed or performed during the hospital encounter of 01/18/22 (from the past 48 hour(s))  CBC     Status: Abnormal   Collection Time: 01/18/22  6:40 PM  Result Value Ref Range   WBC 11.3 (H) 4.0 - 10.5 K/uL    Comment: REPEATED TO VERIFY WHITE COUNT CONFIRMED ON SMEAR    RBC 5.37 (H) 3.87 - 5.11 MIL/uL   Hemoglobin 16.1 (H) 12.0 - 15.0 g/dL   HCT 32.444.1 40.136.0 - 02.746.0 %   MCV 82.1 80.0 - 100.0 fL   MCH 30.0 26.0 - 34.0 pg   MCHC 36.5 (H) 30.0 - 36.0 g/dL   RDW 25.312.5 66.411.5 - 40.315.5 %   Platelets 295 150 - 400 K/uL    Comment: REPEATED TO VERIFY   nRBC 0.0 0.0 - 0.2 %    Comment: Performed at [facility], [address] [place], [place] [address]  Comprehensive metabolic panel     Status: Abnormal   Collection Time: 01/18/22  6:40 PM  Result  Value Ref Range   Sodium 118 (LL) 135 - 145 mmol/L    Comment: CRITICAL RESULT CALLED TO, READ BACK BY AND VERIFIED WITH M,COCHRANE RN @2011  01/18/22 E,BENTON   Potassium <2.0 (LL) 3.5 - 5.1 mmol/L    Comment: CRITICAL RESULT CALLED TO, READ BACK BY AND VERIFIED WITH M,COCHRANE RN @2011  01/18/22 E,[place]ON RESULT OKAY TO RELEASE PER M ,COCHRANE RN REPEATED TO VERIFY    Chloride 68 (L) 98 - 111 mmol/L   CO2 28 22 - 32 mmol/L   Glucose, Bld 127 (H) 70 - 99 mg/dL    Comment: Glucose reference range applies only to samples taken after fasting for at least 8 hours.   BUN 49 (H) 8 - 23 mg/dL   Creatinine, Ser 9.562.64 (H) 0.44 - 1.00 mg/dL   Calcium 9.9 8.9 - 38.710.3 mg/dL   Total Protein 8.2 (H) 6.5 - 8.1 g/dL   Albumin 4.2 3.5 - 5.0 g/dL   AST 45 (H) 15 - 41 U/L   ALT 28 0 - 44 U/L   Alkaline Phosphatase 66 38 - 126 U/L   Total Bilirubin 1.0 0.3 - 1.2 mg/dL   GFR, Estimated 18 (L) >60 mL/min    Comment: (NOTE) Calculated using the CKD-EPI Creatinine Equation (2021)    Anion gap 22 (H) 5 - 15    Comment: Performed at [facility], [address] [place], [place] [id number]  Basic metabolic panel     Status: Abnormal   Collection Time: 01/18/22  8:20 PM  Result Value Ref Range   Sodium 117 (LL) 135 - 145 mmol/L    Comment: CRITICAL RESULT CALLED TO, READ BACK BY AND VERIFIED WITH MYKENZIE COCHRANE RN 01/18/22 2314 M KOROLESKI   Potassium 2.4 (LL) 3.5 - 5.1 mmol/L    Comment: CRITICAL RESULT CALLED TO, READ BACK BY AND VERIFIED WITH MYKENZIE COCHRANE RN 01/18/22 2314 M KOROLESKI   Chloride 70 (L) 98 - 111 mmol/L   CO2 26 22 - 32 mmol/L   Glucose, Bld 121 (H) 70 - 99 mg/dL    Comment: Glucose reference range applies only to samples taken after fasting for at least 8 hours.   BUN 50 (H) 8 - 23 mg/dL   Creatinine, Ser 2.952.73 (H) 0.44 - 1.00 mg/dL   Calcium 9.9 8.9 - 10.3  mg/dL   GFR, Estimated 17 (L) >60 mL/min    Comment: (NOTE) Calculated using the CKD-EPI Creatinine Equation (2021)     Anion gap 21 (H) 5 - 15    Comment: Electrolytes repeated to confirm. Performed at [facility], [address] [place], [place] [address]   Magnesium     Status: Abnormal   Collection Time: 01/18/22  8:20 PM  Result Value Ref Range   Magnesium 1.4 (L) 1.7 - 2.4 mg/dL    Comment: Performed at [facility], [address] [place], [place] [address]  CBC with Differential/Platelet     Status: Abnormal   Collection Time: 01/19/22  1:33 AM  Result Value Ref Range   WBC 12.2 (H) 4.0 - 10.5 K/uL    Comment: REPEATED TO VERIFY WHITE COUNT CONFIRMED ON SMEAR    RBC 5.57 (H) 3.87 - 5.11 MIL/uL   Hemoglobin 17.0 (H) 12.0 - 15.0 g/dL   HCT 09.8 11.9 - 14.7 %   MCV 81.3 80.0 - 100.0 fL   MCH 30.5 26.0 - 34.0 pg   MCHC 37.5 (H) 30.0 - 36.0 g/dL   RDW 82.9 56.2 - 13.0 %   Platelets 232 150 - 400 K/uL    Comment: REPEATED TO VERIFY   nRBC 0.0 0.0 - 0.2 %   Neutrophils Relative % 55 %   Neutro Abs 6.7 1.7 - 7.7 K/uL   Lymphocytes Relative 20 %   Lymphs Abs 2.4 0.7 - 4.0 K/uL   Monocytes Relative 8 %   Monocytes Absolute 1.0 0.1 - 1.0 K/uL   Eosinophils Relative 17 %   Eosinophils Absolute 2.1 (H) 0.0 - 0.5 K/uL   Basophils Relative 0 %   Basophils Absolute 0.0 0.0 - 0.1 K/uL   Immature Granulocytes 0 %   Abs Immature Granulocytes 0.04 0.00 - 0.07 K/uL    Comment: Performed at [facility], [address], [place], [place] [address]  Lactic acid, plasma     Status: Abnormal   Collection Time: 01/19/22  3:14 AM  Result Value Ref Range   Lactic Acid, Venous 2.3 (HH) 0.5 - 1.9 mmol/L    Comment: CRITICAL RESULT CALLED TO, READ BACK BY AND VERIFIED WITH LATOYA MILES RN 01/19/22 0404 En[place] Derry Performed at [facility], [address], [place], [place] [address]   Ethanol     Status: None   Collection Time: 01/19/22  3:14 AM  Result Value Ref Range   Alcohol, Ethyl (B) <10 <10 mg/dL    Comment: (NOTE) Lowest detectable limit for serum alcohol is 10  mg/dL.  For medical purposes only. Performed at [facility], [address] [place], [place] [address]   TSH     Status: None   Collection Time: 01/19/22  3:14 AM  Result Value Ref Range   TSH 0.910 0.350 - 4.500 uIU/mL    Comment: Performed by a 3rd Generation assay with a functional sensitivity of <=0.01 uIU/mL. Performed at [facility], [address], [place], [place] [address]   Basic metabolic panel     Status: Abnormal   Collection Time: 01/19/22  3:14 AM  Result Value Ref Range   Sodium 118 (LL) 135 - 145 mmol/L    Comment: CRITICAL RESULT CALLED TO, READ BACK BY AND VERIFIED WITH CRYSTAL BLATT RN 01/19/22 0403 M KOROLESKI   Potassium 3.2 (L) 3.5 - 5.1 mmol/L   Chloride 73 (L) 98 - 111  mmol/L   CO2 25 22 - 32 mmol/L   Glucose, Bld 116 (H) 70 - 99 mg/dL    Comment: Glucose reference range applies only to samples taken after fasting for at least 8 hours.   BUN 48 (H) 8 - 23 mg/dL   Creatinine, Ser 4.58 (H) 0.44 - 1.00 mg/dL   Calcium 9.2 8.9 - 09.9 mg/dL   GFR, Estimated 21 (L) >60 mL/min    Comment: (NOTE) Calculated using the CKD-EPI Creatinine Equation (2021)    Anion gap 20 (H) 5 - 15    Comment: Performed at [facility], [address], [place], [place] [address]  Magnesium     Status: None   Collection Time: 01/19/22  3:14 AM  Result Value Ref Range   Magnesium 1.9 1.7 - 2.4 mg/dL    Comment: Performed at [facility], [address] [place], [place] [address]  Procalcitonin - Baseline     Status: None   Collection Time: 01/19/22  3:14 AM  Result Value Ref Range   Procalcitonin 0.43 ng/mL    Comment:        Interpretation: PCT (Procalcitonin) <= 0.5 ng/mL: Systemic infection (sepsis) is not likely. Local bacterial infection is possible. (NOTE)       Sepsis PCT Algorithm           Lower Respiratory Tract                                      Infection PCT Algorithm    ----------------------------      ----------------------------         PCT < 0.25 ng/mL                PCT < 0.10 ng/mL          Strongly encourage             Strongly discourage   discontinuation of antibiotics    initiation of antibiotics    ----------------------------     -----------------------------       PCT 0.25 - 0.50 ng/mL            PCT 0.10 - 0.25 ng/mL               OR       >80% decrease in PCT            Discourage initiation of                                            antibiotics      Encourage discontinuation           of antibiotics    ----------------------------     -----------------------------         PCT >= 0.50 ng/mL              PCT 0.26 - 0.50 ng/mL               AND        <80% decrease in PCT             Encourage initiation of  antibiotics       Encourage continuation           of antibiotics    ----------------------------     -----------------------------        PCT >= 0.50 ng/mL                  PCT > 0.50 ng/mL               AND         increase in PCT                  Strongly encourage                                      initiation of antibiotics    Strongly encourage escalation           of antibiotics                                     -----------------------------                                           PCT <= 0.25 ng/mL                                                 OR                                        > 80% decrease in PCT                                      Discontinue / Do not initiate                                             antibiotics  Performed at [facility], [address], [place], [place] [address]   I-Stat venous blood gas, ED     Status: Abnormal   Collection Time: 01/19/22  3:22 AM  Result Value Ref Range   pH, Ven 7.494 (H) 7.25 - 7.43   pCO2, Ven 36.9 (L) 44 - 60 mmHg   pO2, Ven 25 (LL) 32 - 45 mmHg   Bicarbonate 28.4 (H) 20.0 - 28.0 mmol/L   TCO2 30 22 - 32 mmol/L   O2 Saturation 53 %    Acid-Base Excess 5.0 (H) 0.0 - 2.0 mmol/L   Sodium 115 (LL) 135 - 145 mmol/L   Potassium 2.3 (LL) 3.5 - 5.1 mmol/L   Calcium, Ion 1.01 (L) 1.15 - 1.40 mmol/L   HCT 42.0 36.0 - 46.0 %   Hemoglobin 14.3 12.0 - 15.0 g/dL   Sample type VENOUS    Comment NOTIFIED PHYSICIAN   Lactic acid, plasma     Status: None   Collection  Time: 01/19/22  5:40 AM  Result Value Ref Range   Lactic Acid, Venous 1.6 0.5 - 1.9 mmol/L    Comment: Performed at [facility], [address] [place], [place] [address]  Basic metabolic panel     Status: Abnormal   Collection Time: 01/19/22  7:50 AM  Result Value Ref Range   Sodium 122 (L) 135 - 145 mmol/L   Potassium 2.6 (LL) 3.5 - 5.1 mmol/L    Comment: CRITICAL RESULT CALLED TO, READ BACK BY AND VERIFIED WITH A.ROFF,RN 0834 01/19/22 CLARK,S   Chloride 81 (L) 98 - 111 mmol/L   CO2 26 22 - 32 mmol/L   Glucose, Bld 99 70 - 99 mg/dL    Comment: Glucose reference range applies only to samples taken after fasting for at least 8 hours.   BUN 44 (H) 8 - 23 mg/dL   Creatinine, Ser 1.94 (H) 0.44 - 1.00 mg/dL   Calcium 8.8 (L) 8.9 - 10.3 mg/dL   GFR, Estimated 23 (L) >60 mL/min    Comment: (NOTE) Calculated using the CKD-EPI Creatinine Equation (2021)    Anion gap 15 5 - 15    Comment: Performed at [facility], [address] [place], [place] [address]  Rapid urine drug screen (hospital performed)     Status: None   Collection Time: 01/19/22  8:02 AM  Result Value Ref Range   Opiates NONE DETECTED NONE DETECTED   Cocaine NONE DETECTED NONE DETECTED   Benzodiazepines NONE DETECTED NONE DETECTED   Amphetamines NONE DETECTED NONE DETECTED   Tetrahydrocannabinol NONE DETECTED NONE DETECTED   Barbiturates NONE DETECTED NONE DETECTED    Comment: (NOTE) DRUG SCREEN FOR MEDICAL PURPOSES ONLY.  IF CONFIRMATION IS NEEDED FOR ANY PURPOSE, NOTIFY LAB WITHIN 5 DAYS.  LOWEST DETECTABLE LIMITS FOR URINE DRUG SCREEN Drug Class                     Cutoff  (ng/mL) Amphetamine and metabolites    1000 Barbiturate and metabolites    200 Benzodiazepine                 200 Opiates and metabolites        300 Cocaine and metabolites        300 THC                            50 Performed at [facility], [address] [place], [place] [address]   Sodium, urine, random     Status: None   Collection Time: 01/19/22  8:02 AM  Result Value Ref Range   Sodium, Ur 33 mmol/L    Comment: Performed at [facility], [address] [place], [place] [address]  Urinalysis, Routine w reflex microscopic Urine, Clean Catch     Status: Abnormal   Collection Time: 01/19/22  8:02 AM  Result Value Ref Range   Color, Urine YELLOW YELLOW   APPearance HAZY (A) CLEAR   Specific Gravity, Urine 1.008 1.005 - 1.030   pH 5.0 5.0 - 8.0   Glucose, UA NEGATIVE NEGATIVE mg/dL   Hgb urine dipstick NEGATIVE NEGATIVE   Bilirubin Urine NEGATIVE NEGATIVE   Ketones, ur NEGATIVE NEGATIVE mg/dL   Protein, ur NEGATIVE NEGATIVE mg/dL   Nitrite NEGATIVE NEGATIVE   Leukocytes,Ua MODERATE (A) NEGATIVE   RBC / HPF 0-5 0 - 5 RBC/hpf   WBC, UA 11-20 0 -  5 WBC/hpf   Bacteria, UA RARE (A) NONE SEEN   Squamous Epithelial / LPF 0-5 0 - 5   Mucus PRESENT    Hyaline Casts, UA PRESENT     Comment: Performed at [facility], [address], [place], [place] [address]    No results found.  Assessment/Plan Hyponatremia: urine Na 33- think collected after fluids.  Regardless- clinical history looks like hypovolemic hyponatremia in the setting of poor PO intake, HCTZ, etc.  Could be exacerbated by Cymbalta too.  She's above 120 this AM which is great.   - in the setting of very low K need to be careful with repletion- we need to replete K first which can cause a relative water diuresis in a hyponatremic patient (no matter the cause of hyponatremia)--> so I've ordered 20 meQ more IV K.  Administration of K and vol resuscitation at the same time can cause overcorrection of  Na - after that- need to slowly volume resuscitate- LR should be fine afterwards - serial BMP will help me decide how fast/ what type of fluids to order- agree with q 4 hr BMP - no seizure activity, pt is improved per report, and Na is now > 120--> no role for 3% at present  2.  Hypokalemia:  - likely poor PO intake + HCTZ  - repleting, have ordered 20 meQ more IV K  3.  Hypomagnesemia:  - repleting  4.  AKI, presumed  - holding lisinopril and all other nephrotoxic meds  - Making urine  - expect to improve with IVFs  Maki Hege 01/19/2022, 10:05 AM

## 2022-01-19 NOTE — ED Notes (Signed)
Pt c/o epigastric pain, states she thinks its heart burn.

## 2022-01-19 NOTE — ED Provider Notes (Signed)
[facility] Emergency Department Provider Note MRN:  [id number]  Arrival date & time: 01/19/22     Chief Complaint   Altered mental status History of Present Illness   Susan Marsh is a 81 y.o. year-old female with a history of GERD, aortic stenosis, hypertension presenting to the ED with chief complaint of altered mental status.  Patient behaving erratically for the past week.  Seems very stressed, not acting herself, very fearful.  Saw a bug on the floor and then started claiming that she saw bugs crawling on the ceiling but they were not there.  Very poor appetite over the past week.  Not drinking excess water.  Review of Systems  A thorough review of systems was obtained and all systems are negative except as noted in the HPI and PMH.   Patient's Health History    Past Medical History:  Diagnosis Date   Aortic stenosis    ASCVD (arteriosclerotic cardiovascular disease)    s/p stent   GERD (gastroesophageal reflux disease)    Hearing loss    R>L   High cholesterol    Hypertension    Osteoarthritis     Past Surgical History:  Procedure Laterality Date   BLADDER SURGERY     CARDIAC SURGERY      Family History  Problem Relation Age of Onset   Other Mother        unsure of history   Other Father        unsure of history   Heart disease Brother     Social History   Socioeconomic History   Marital status: Unknown    Spouse name: Not on file   Number of children: 4   Years of education: 12   Highest education level: High school graduate  Occupational History   Occupation: Retired  Tobacco Use   Smoking status: Never   Smokeless tobacco: Not on file  Substance and Sexual Activity   Alcohol use: Never   Drug use: Never   Sexual activity: Not on file  Other Topics Concern   Not on file  Social History Narrative   Lives with son.   Right-handed.   No daily caffeine.   Social Determinants of Health   Financial Resource Strain:  Not on file  Food Insecurity: Not on file  Transportation Needs: Not on file  Physical Activity: Not on file  Stress: Not on file  Social Connections: Not on file  Intimate Partner Violence: Not on file     Physical Exam   Vitals:   01/18/22 2150 01/18/22 2334  BP: (!) 147/88 (!) 146/88  Pulse: 95 99  Resp: (!) 8 16  Temp:    SpO2: 98% 98%    CONSTITUTIONAL: Well-appearing, NAD NEURO/PSYCH:  Alert, oriented to name, moves all extremities equally, very hard of hearing EYES:  eyes equal and reactive ENT/NECK:  no LAD, no JVD CARDIO: Regular rate, well-perfused, normal S1 and S2 PULM:  CTAB no wheezing or rhonchi GI/GU:  non-distended, non-tender MSK/SPINE:  No gross deformities, no edema SKIN:  no rash, atraumatic   *Additional and/or pertinent findings included in MDM below  Diagnostic and Interventional Summary    EKG Interpretation  Date/Time:  Sunday January 18 2022 20:22:19 EDT Ventricular Rate:  106 PR Interval:  160 QRS Duration: 124 QT Interval:  386 QTC Calculation: 512 R Axis:   -45 Text Interpretation: Sinus tachycardia with Premature atrial complexes Left anterior fascicular block Left ventricular hypertrophy with QRS widening  and repolarization abnormality ( R in aVL , Cornell product , Romhilt-Estes ) Cannot rule out Septal infarct , age undetermined Abnormal ECG When compared with ECG of 19-Feb-2015 01:23, PREVIOUS ECG IS PRESENT Confirmed by Kennis Carina [phone]) on 01/19/2022 1:13:58 AM       Labs Reviewed  CBC - Abnormal; Notable for the following components:      Result Value   WBC 11.3 (*)    RBC 5.37 (*)    Hemoglobin 16.1 (*)    MCHC 36.5 (*)    All other components within normal limits  COMPREHENSIVE METABOLIC PANEL - Abnormal; Notable for the following components:   Sodium 118 (*)    Potassium <2.0 (*)    Chloride 68 (*)    Glucose, Bld 127 (*)    BUN 49 (*)    Creatinine, Ser 2.64 (*)    Total Protein 8.2 (*)    AST 45 (*)     GFR, Estimated 18 (*)    Anion gap 22 (*)    All other components within normal limits  BASIC METABOLIC PANEL - Abnormal; Notable for the following components:   Sodium 117 (*)    Potassium 2.4 (*)    Chloride 70 (*)    Glucose, Bld 121 (*)    BUN 50 (*)    Creatinine, Ser 2.73 (*)    GFR, Estimated 17 (*)    Anion gap 21 (*)    All other components within normal limits  MAGNESIUM - Abnormal; Notable for the following components:   Magnesium 1.4 (*)    All other components within normal limits  URINALYSIS, ROUTINE W REFLEX MICROSCOPIC  CBC WITH DIFFERENTIAL/PLATELET  CBC WITH DIFFERENTIAL/PLATELET    No orders to display    Medications  magnesium oxide (MAG-OX) tablet 800 mg (has no administration in time range)  potassium chloride SA (KLOR-CON M) CR tablet 40 mEq (has no administration in time range)  potassium chloride 10 mEq in 100 mL IVPB (has no administration in time range)  magnesium sulfate IVPB 2 g 50 mL (has no administration in time range)  lactated ringers bolus 1,000 mL (has no administration in time range)     Procedures  /  Critical Care .Critical Care  Performed by: Sabas Sous, MD Authorized by: Sabas Sous, MD   Critical care provider statement:    Critical care time (minutes):  36   Critical care was necessary to treat or prevent imminent or life-threatening deterioration of the following conditions:  Metabolic crisis (Profound hyponatremia)   Critical care was time spent personally by me on the following activities:  Development of treatment plan with patient or surrogate, discussions with consultants, evaluation of patient's response to treatment, examination of patient, ordering and review of laboratory studies, ordering and review of radiographic studies, ordering and performing treatments and interventions, pulse oximetry, re-evaluation of patient's condition and review of old charts   ED Course and Medical Decision Making  Initial Impression  and Ddx Patient presenting with what is described as more of a psychiatric disturbance with possible hallucinations, very fearful, behaving erratically.  Was seen at behavioral health urgent care and outpatient management was suggested.  Brought here by daughter at patient's request, she was very fearful of going back to her home.  Fairly reassuring vital signs, no fever, mild tachycardia.  Basic labs revealing profound hyponatremia, hypochloremia, hypomagnesemia, acute kidney injury.  Patient does not seem fluid overloaded on exam, has not been drinking excess water, if  anything is likely dry from very poor p.o. intake.  She is awake and alert, she is not somnolent, suspect the sodium is more of a chronic issue.  And so I do not feel that 3% saline is indicated at this time.  Past medical/surgical history that increases complexity of ED encounter:  HTN  Interpretation of Diagnostics I personally reviewed the EKG and my interpretation is as follows: Sinus tachycardia, PACs  See laboratory interpretation above  Patient Reassessment and Ultimate Disposition/Management     We will admit to stepdown versus ICU.  Patient management required discussion with the following services or consulting groups:  Hospitalist Service  Complexity of Problems Addressed Acute illness or injury that poses threat of life of bodily function  Additional Data Reviewed and Analyzed Further history obtained from: Further history from spouse/family member  Additional Factors Impacting ED Encounter Risk Consideration of hospitalization  Elmer Sow. Pilar Plate, MD [facility] Emergency Medicine [facility] [email or web address]  Final Clinical Impressions(s) / ED Diagnoses     ICD-10-CM   1. Hyponatremia  E87.1     2. Hypochloremia  E87.8     3. Hypomagnesemia  E83.42     4. AKI (acute kidney injury) (HCC)  N17.9     5. Anxiety  F41.9       ED Discharge Orders     None        Discharge  Instructions Discussed with and Provided to Patient:   Discharge Instructions   None      Sabas Sous, MD 01/19/22 [phone]

## 2022-01-19 NOTE — H&P (Addendum)
History and Physical    [name]:[id number] DOB: 13-Mar-1941 DOA: 01/18/2022  PCP: [name], MD  Patient coming from: home  I have personally briefly reviewed patient's old medical records in [facility]  Chief Complaint: anxiety/ visual hallucinations  HPI: Susan Marsh is a 81 y.o. female with medical history significant of aortic stenosis cad s/p stent, GERD,  HLD , depression,hypertension  who presents to ED with erratic behavior progressive over the last week associated with poor intake.Per family decreased as increase anxiety as well as episodes of visual hallucinations patient states she see bugs.  Patient prior to presenting to ED was seen at behavioral health ED and discharged with outpatient follow up. Patient brought to ED as patient was very fearful about going home.  Of note unable to get history as this time as patient was medicated in ed for anxiety .  ED Course:  Vitals: temp 97.4 147, 88, hr 95  rr 16, sat 98% on ra ON evaluation in ED patient was found to have hyponatremia 117 , hypokalemia2.4 and hypomag at at 1.4.  Replace was initiated and patient slated for admission for further treatment.   Labs:  NA 119, K <2.0,  glu 127 cr 2.64  Repeat labs  N A 117, K 2.4, cr 2.73 ( 0.66) Ma 1.4 , AG 21  Tx 1LR, mag oxide, KLOr 40 meq Review of Systems: As per HPI otherwise 10 point review of systems negative.   Past Medical History:  Diagnosis Date   Aortic stenosis    ASCVD (arteriosclerotic cardiovascular disease)    s/p stent   GERD (gastroesophageal reflux disease)    Hearing loss    R>L   High cholesterol    Hypertension    Osteoarthritis     Past Surgical History:  Procedure Laterality Date   BLADDER SURGERY     CARDIAC SURGERY       reports that she has never smoked. She does not have any smokeless tobacco history on file. She reports that she does not drink alcohol and does not use drugs.  Allergies  Allergen Reactions    Alendronate Sodium     Other reaction(s): GI upset   Other     Cortisporin otic Other reaction(s): GI upset    Family History  Problem Relation Age of Onset   Other Mother        unsure of history   Other Father        unsure of history   Heart disease Brother     Prior to Admission medications   Medication Sig Start Date End Date Taking? Authorizing Provider  amLODipine (NORVASC) 10 MG tablet Take 10 mg by mouth daily. 02/18/21  Yes [provider]  clonazePAM (KLONOPIN) 0.5 MG tablet Take 0.5 mg by mouth daily as needed for anxiety.   Yes [provider]  Lidocaine (ZTLIDO) 1.8 % PTCH Apply 1.8 patches topically as needed. Patient taking differently: Apply 1.8 patches topically as needed (for leg pain). 01/13/22  Yes Camara, Amalia Hailey, MD  lisinopril (PRINIVIL,ZESTRIL) 40 MG tablet Take 40 mg by mouth daily. 02/10/15  Yes [provider]  meloxicam (MOBIC) 15 MG tablet Take 15 mg by mouth daily as needed for pain. 03/03/21  Yes [provider]  metoprolol (LOPRESSOR) 50 MG tablet Take 50 mg by mouth 2 (two) times daily. 02/10/15  Yes [provider]  nitrofurantoin, macrocrystal-monohydrate, (MACROBID) 100 MG capsule Take 100 mg by mouth 2 (two) times  daily. 01/17/22  Yes [provider]  ondansetron (ZOFRAN-ODT) 4 MG disintegrating tablet Take 4 mg by mouth 3 (three) times daily. 01/17/22  Yes [provider]  pantoprazole (PROTONIX) 40 MG tablet Take 1 tablet by mouth daily. 02/16/21  Yes [provider]  pravastatin (PRAVACHOL) 40 MG tablet Take 40 mg by mouth daily. 02/10/15  Yes [provider]  aspirin 81 MG chewable tablet Chew 81 mg by mouth daily.    [provider]  DULoxetine (CYMBALTA) 60 MG capsule Take 1 capsule (60 mg total) by mouth daily. Patient not taking: Reported on 01/19/2022 12/24/21   Windell Norfolk, MD  hydrochlorothiazide (HYDRODIURIL) 25 MG tablet 1 tablet in the  morning Patient not taking: Reported on 01/19/2022    [provider]    Physical Exam: Vitals:   01/18/22 1804 01/18/22 2150 01/18/22 2334 01/19/22 0159  BP: 139/61 (!) 147/88 (!) 146/88   Pulse: (!) 107 95 99   Resp: 16 (!) 8 16   Temp: (!) 97.4 F (36.3 C)   (!) 97.4 F (36.3 C)  TempSrc: Oral     SpO2: 96% 98% 98%     Constitutional: NAD, calm, comfortable Vitals:   01/18/22 1804 01/18/22 2150 01/18/22 2334 01/19/22 0159  BP: 139/61 (!) 147/88 (!) 146/88   Pulse: (!) 107 95 99   Resp: 16 (!) 8 16   Temp: (!) 97.4 F (36.3 C)   (!) 97.4 F (36.3 C)  TempSrc: Oral     SpO2: 96% 98% 98%    Eyes: PERRL, lids and conjunctivae normal ENMT: Mucous membranes are moist. Posterior pharynx clear of any exudate or lesions.Normal dentition.  Neck: normal, supple, no masses, no thyromegaly Respiratory: clear to auscultation bilaterally, no wheezing, no crackles. Normal respiratory effort. No accessory muscle use.  Cardiovascular: Regular rate and rhythm, no murmurs / rubs / gallops. No extremity edema. 2+ pedal pulses. No carotid bruits.  Abdomen: no tenderness, no masses palpated. No hepatosplenomegaly. Bowel sounds positive.  Musculoskeletal: no clubbing / cyanosis. No joint deformity upper and lower extremities. Good ROM, no contractures. Normal muscle tone.  Skin: no rashes, lesions, ulcers. No induration Neurologic: CN 2-12 grossly intact. Sensation intact, DTR normal. Strength 5/5 in all 4.  Psychiatric: sleeping/ somnolent  Labs on Admission: I have personally reviewed following labs and imaging studies  CBC: Recent Labs  Lab 01/18/22 1840  WBC 11.3*  HGB 16.1*  HCT 44.1  MCV 82.1  PLT 295   Basic Metabolic Panel: Recent Labs  Lab 01/18/22 1840 01/18/22 2020  NA 118* 117*  K <2.0* 2.4*  CL 68* 70*  CO2 28 26  GLUCOSE 127* 121*  BUN 49* 50*  CREATININE 2.64* 2.73*  CALCIUM 9.9 9.9  MG  --  1.4*   GFR: CrCl cannot be calculated (Unknown ideal  weight.). Liver Function Tests: Recent Labs  Lab 01/18/22 1840  AST 45*  ALT 28  ALKPHOS 66  BILITOT 1.0  PROT 8.2*  ALBUMIN 4.2   No results for input(s): "LIPASE", "AMYLASE" in the last 168 hours. No results for input(s): "AMMONIA" in the last 168 hours. Coagulation Profile: No results for input(s): "INR", "PROTIME" in the last 168 hours. Cardiac Enzymes: No results for input(s): "CKTOTAL", "CKMB", "CKMBINDEX", "TROPONINI" in the last 168 hours. BNP (last 3 results) No results for input(s): "PROBNP" in the last 8760 hours. HbA1C: No results for input(s): "HGBA1C" in the last 72 hours. CBG: No results for input(s): "GLUCAP" in the last 168  hours. Lipid Profile: No results for input(s): "CHOL", "HDL", "LDLCALC", "TRIG", "CHOLHDL", "LDLDIRECT" in the last 72 hours. Thyroid Function Tests: No results for input(s): "TSH", "T4TOTAL", "FREET4", "T3FREE", "THYROIDAB" in the last 72 hours. Anemia Panel: No results for input(s): "VITAMINB12", "FOLATE", "FERRITIN", "TIBC", "IRON", "RETICCTPCT" in the last 72 hours. Urine analysis:    Component Value Date/Time   COLORURINE YELLOW 02/18/2015 2040   APPEARANCEUR CLEAR 02/18/2015 2040   LABSPEC 1.012 02/18/2015 2040   PHURINE 7.0 02/18/2015 2040   GLUCOSEU NEGATIVE 02/18/2015 2040   HGBUR NEGATIVE 02/18/2015 2040   [place] NEGATIVE 02/18/2015 2040   [place] NEGATIVE 02/18/2015 2040   PROTEINUR NEGATIVE 02/18/2015 2040   NITRITE NEGATIVE 02/18/2015 2040   LEUKOCYTESUR SMALL (A) 02/18/2015 2040    Radiological Exams on Admission: No results found.  EKG: Independently reviewed. Sinus tachycardia, pac, LAPB unchanged from prior  Assessment/Plan  Symptomatic hyponatremia  associated with metabolic encephalopathy -admit to progressive care  -consult renal to assist with ivfs  -neuro checks  -q4hour NA levels -hold offending medications patient on HCTZ SSRI -f/u on serum osmo , urine na   -CTH/UDS/ETOH to be complete r/o  other cause of change in ms  -nephrology consult and requested to manage IVFS , awaiting final recs and orders   AKI  -cr 2.64 base 0.66  -ivfs  -monitor strip I/0 -await renal recs  Hypokalemia  Hypomagnesemia  -replete prn   Anxiety Depression  -psych consult  -one to one sitter due to erratic behavior      Cad s/p stent -no cardiac complaints -resume asa, statin, metoprolol as able    GERD, Ppi   HLD -statin   Hypertension  -resume metoprolol  -hold hctz due to hyponatremia  -hold ace due to aki      DVT prophylaxis:  Heparin Code Status: full Family Communication:  Disposition Plan:  Consults called: Renal , Peeple MD Admission status: progressive care    Clance Boll MD [facility]  If 7PM-7AM, please contact night-coverage [email or web address] Password [email or web address]  01/19/2022, 2:07 AM

## 2022-01-19 NOTE — Progress Notes (Signed)
Subjective: Patient admitted this morning, see detailed H&P by Dr. Marcello Moores 81 year old female with medical history of aortic stenosis, CAD, s/p stent, GERD, hyperlipidemia, depression, hypertension came to ED with erratic behavior.  Also associated with poor p.o. intake. Patient was having visual hallucinations, seeing bugs.  She was seen at behavioral health ED and was discharged with outpatient follow-up however patient was fearful about going home. In the ED patient was found to have hyponatremia with sodium 117, hypokalemia 2.4 and hypomagnesemia 1.4.   Vitals:   01/19/22 0159 01/19/22 0743  BP:  114/66  Pulse:  (!) 107  Resp:  17  Temp: (!) 97.4 F (36.3 C) 98.3 F (36.8 C)  SpO2:  25%      A/P Metabolic encephalopathy Hyponatremia Hypokalemia Visual hallucinations Acute kidney injury  Hyponatremia Sodium is down to 115, likely from HCTZ which patient was taking at home.  Also check urine osmolarity for psychogenic polydipsia.  Urine and serum osmolality has been ordered and is currently pending. Nephrology has been consulted  Acute kidney injury Likely from HCTZ, dehydration Patient did receive LR 1 L Nephrology to follow  Visual hallucinations/metabolic encephalopathy -Psych following for anxiety and depression -Likely hyponatremia also contributing to patient's altered mental status  Hypokalemia -Potassium replaced, follow BMP   Hypomagnesemia -Replete   Hypertension -Continue metoprolol  Hyperlipidemia -Continue statin  CAD s/p stent placement -Continue aspirin, statin, metoprolol  Oswald Hillock [facility]

## 2022-01-20 ENCOUNTER — Inpatient Hospital Stay (HOSPITAL_COMMUNITY): Payer: Medicare HMO

## 2022-01-20 DIAGNOSIS — E878 Other disorders of electrolyte and fluid balance, not elsewhere classified: Secondary | ICD-10-CM

## 2022-01-20 DIAGNOSIS — N179 Acute kidney failure, unspecified: Secondary | ICD-10-CM | POA: Diagnosis not present

## 2022-01-20 DIAGNOSIS — E871 Hypo-osmolality and hyponatremia: Secondary | ICD-10-CM | POA: Diagnosis not present

## 2022-01-20 LAB — BASIC METABOLIC PANEL
Anion gap: 17 — ABNORMAL HIGH (ref 5–15)
Anion gap: 12 (ref 5–15)
BUN: 41 mg/dL — ABNORMAL HIGH (ref 8–23)
BUN: 34 mg/dL — ABNORMAL HIGH (ref 8–23)
CO2: 22 mmol/L (ref 22–32)
CO2: 22 mmol/L (ref 22–32)
Calcium: 9.5 mg/dL (ref 8.9–10.3)
Calcium: 8.2 mg/dL — ABNORMAL LOW (ref 8.9–10.3)
Chloride: 84 mmol/L — ABNORMAL LOW (ref 98–111)
Chloride: 95 mmol/L — ABNORMAL LOW (ref 98–111)
Creatinine, Ser: 1.62 mg/dL — ABNORMAL HIGH (ref 0.44–1.00)
Creatinine, Ser: 1.34 mg/dL — ABNORMAL HIGH (ref 0.44–1.00)
GFR, Estimated: 32 mL/min — ABNORMAL LOW (ref >60)
GFR, Estimated: 40 mL/min — ABNORMAL LOW (ref >60)
Glucose, Bld: 94 mg/dL (ref 70–99)
Glucose, Bld: 94 mg/dL (ref 70–99)
Potassium: 3.8 mmol/L (ref 3.5–5.1)
Potassium: 3.6 mmol/L (ref 3.5–5.1)
Sodium: 123 mmol/L — ABNORMAL LOW (ref 135–145)
Sodium: 129 mmol/L — ABNORMAL LOW (ref 135–145)

## 2022-01-20 LAB — RENAL FUNCTION PANEL
Albumin: 3.5 g/dL (ref 3.5–5.0)
Anion gap: 15 (ref 5–15)
BUN: 36 mg/dL — ABNORMAL HIGH (ref 8–23)
CO2: 27 mmol/L (ref 22–32)
Calcium: 9.5 mg/dL (ref 8.9–10.3)
Chloride: 87 mmol/L — ABNORMAL LOW (ref 98–111)
Creatinine, Ser: 1.43 mg/dL — ABNORMAL HIGH (ref 0.44–1.00)
GFR, Estimated: 37 mL/min — ABNORMAL LOW (ref >60)
Glucose, Bld: 114 mg/dL — ABNORMAL HIGH (ref 70–99)
Phosphorus: 1.7 mg/dL — ABNORMAL LOW (ref 2.5–4.6)
Potassium: 3.5 mmol/L (ref 3.5–5.1)
Sodium: 129 mmol/L — ABNORMAL LOW (ref 135–145)

## 2022-01-20 MED ORDER — LACTATED RINGERS IV SOLN: INTRAVENOUS | Status: DC

## 2022-01-20 MED ORDER — FAMOTIDINE 20 MG PO TABS
20.0000 mg | ORAL_TABLET | Freq: Two times a day (BID) | ORAL | Status: DC
  Administered 2022-01-20 – 2022-01-23 (×7): 20 mg via ORAL
  Filled 2022-01-20 (×7): qty 1

## 2022-01-20 NOTE — ED Notes (Signed)
Pt is confused when answering questions and does not answer appropriately, such as answering the previous question or saying something unrelated.

## 2022-01-20 NOTE — Progress Notes (Signed)
Overnight progress note  Notified by RN that the patient's family was here earlier and has now left.  They told RN that when the patient was in the ED, she had complained of pain in her sternum and they were concerned that she might have a fracture and requested an x-ray.  Informed by RN that patient is currently denying any chest pain and resting comfortably.  Vital signs: Temperature 98.2 F, heart rate 101, respiratory rate 18, blood pressure 141/83, SPO2 98% on room air.  -Stat chest x-ray -Stat EKG and troponin x2

## 2022-01-20 NOTE — Progress Notes (Signed)
[facility] Progress Note   Assessment/ Plan:   Hyponatremia: urine Na 33- think collected after fluids.  Regardless- clinical history looks like hypovolemic hyponatremia in the setting of poor PO intake, HCTZ, etc.  Could be exacerbated by Cymbalta too.  She's above 120 this AM which is great.   - in the setting of very low K need to be careful with repletion- we need to replete K first which can cause a relative water diuresis in a hyponatremic patient (no matter the cause of hyponatremia)--> so I've ordered 20 meQ more IV K.  Administration of K and vol resuscitation at the same time can cause overcorrection of Na - K replete now - resume LR @ 75 mL/ hr x 24 hrs - continue serial BMP checks   2.  Hypokalemia:             - likely poor PO intake + HCTZ             - repleted   3.  Hypomagnesemia:             - repleting   4.  AKI, presumed             - holding lisinopril and all other nephrotoxic meds             - Making urine             - expect to improve with IVFs  5.  Epigastric pain  - ordering pepcid given PPI can cause hypomag  - RUQ [place]- checking for cholelithiasis  - rest per primary  6.  Dispo: admitted  Subjective:    Na up to 123.  Feeling better.  Reporting heartburn and also pain after eating greasy foods.     Objective:   BP 133/75   Pulse 96   Temp (!) 97.5 F (36.4 C)   Resp 19   SpO2 93%  No intake or output data in the 24 hours ending 01/20/22 0939 Weight change:   Physical Exam: GEN NAD, lying in bed, affect brighter today HEENT EOMI PERRL HOH NECK flat neck veins PULM clear CV RRR ABD soft EXT no LE edema NEURO AAOx3, sharper than yesterday SKIN warm and dry MSK  no effusions  Imaging: No results found.  Labs: BMET Recent Labs  Lab 01/19/22 0949 01/19/22 1152 01/19/22 1352 01/19/22 1508 01/19/22 1732 01/19/22 1958 01/19/22 2327  NA 121* 123* 123* 124* 123* 123* 123*  K 3.3* 4.5 3.6 3.7 3.6 3.4* 3.8  CL 78*  80* 82* 83* 85* 87* 84*  CO2 27 27 27 26 26 25 22   GLUCOSE 111* 98 101* 100* 117* 105* 94  BUN 45* 42* 41* 42* 41* 40* 41*  CREATININE 2.10* 1.86* 1.88* 1.94* 1.85* 1.71* 1.62*  CALCIUM 9.5 9.4 9.4 9.3 9.3 9.2 9.5   CBC Recent Labs  Lab 01/18/22 1840 01/19/22 0133 01/19/22 0322  WBC 11.3* 12.2*  --   NEUTROABS  --  6.7  --   HGB 16.1* 17.0* 14.3  HCT 44.1 45.3 42.0  MCV 82.1 81.3  --   PLT 295 232  --     Medications:     enoxaparin (LOVENOX) injection  40 mg Subcutaneous Q24H    Madelon Lips MD 01/20/2022, 9:39 AM

## 2022-01-20 NOTE — Progress Notes (Signed)
[facility]  PROGRESS NOTE  Susan Marsh ZWC:[id number] DOB: [birth year] DOA: 01/18/2022 PCP: Shirline Frees, MD   Brief HPI:   81 year old female with medical history of aortic stenosis, CAD, s/p stent, GERD, hyperlipidemia, depression, hypertension came to ED with erratic behavior.  Also associated with poor p.o. intake. Patient was having visual hallucinations, seeing bugs.  She was seen at behavioral health ED and was discharged with outpatient follow-up however patient was fearful about going home. In the ED patient was found to have hyponatremia with sodium 117, hypokalemia 2.4 and hypomagnesemia 1.4.    Subjective   Patient seen and examined, denies any complaints.   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 -Nephrology consulted  Acute kidney injury -Likely from HCTZ, dehydration -Creatinine has improved with IV fluids -Nephrology following   Hypokalemia -Replete  Hypomagnesemia -Replete  Hypertension -Continue metoprolol  Hyperlipidemia -Continue statin  CAD s/p stent placement -Continue aspirin, statin, metoprolol    Medications     enoxaparin (LOVENOX) injection  40 mg Subcutaneous Q24H   famotidine  20 mg Oral BID     Data Reviewed:   CBG:  No results for input(s): "GLUCAP" in the last 168 hours.  SpO2: 97 %    Vitals:   01/20/22 1130 01/20/22 1153 01/20/22 1156 01/20/22 1300  BP: (!) 134/90   129/81  Pulse: 95   (!) 110  Resp: 17   17  Temp:  (!) 97.5 F (36.4 C) 97.8 F (36.6 C)   TempSrc:   Oral   SpO2: 93%   97%      Data  Reviewed:  Basic Metabolic Panel: Recent Labs  Lab 01/18/22 2020 01/19/22 0314 01/19/22 0322 01/19/22 1508 01/19/22 1732 01/19/22 1958 01/19/22 2327 01/20/22 1157  NA 117* 118*   < > 124* 123* 123* 123* 129*  K 2.4* 3.2*   < > 3.7 3.6 3.4* 3.8 3.6  CL 70* 73*   < > 83* 85* 87* 84* 95*  CO2 26 25   < > 26 26 25 22 22   GLUCOSE 121* 116*   < > 100* 117* 105* 94 94  BUN 50* 48*   < > 42* 41* 40* 41* 34*  CREATININE 2.73* 2.27*   < > 1.94* 1.85* 1.71* 1.62* 1.34*  CALCIUM 9.9 9.2   < > 9.3 9.3 9.2 9.5 8.2*  MG 1.4* 1.9  --   --   --   --   --   --    < > = values in this interval not displayed.    CBC: Recent Labs  Lab 01/18/22 1840 01/19/22 0133 01/19/22 0322  WBC 11.3* 12.2*  --   NEUTROABS  --  6.7  --   HGB 16.1* 17.0* 14.3  HCT 44.1 45.3 42.0  MCV 82.1 81.3  --   PLT 295 232  --     LFT Recent Labs  Lab 01/18/22 1840  AST 45*  ALT 28  ALKPHOS 66  BILITOT 1.0  PROT 8.2*  ALBUMIN 4.2     Antibiotics: Anti-infectives (From admission, onward)    None        DVT prophylaxis: Lovenox  Code Status: Full code  Family Communication:    CONSULTS nephrology   Objective    Physical Examination:   General: Appears in no acute distress Cardiovascular: S1-S2, regular, no murmur auscultated Respiratory: Clear to auscultation bilaterally Abdomen: Abdomen is soft, nontender, no organomegaly Extremities: No edema in the lower extremities Neurologic: Alert, oriented to self and place only, no focal deficit noted   Status is: Inpatient:             [place]   [facility] If 7PM-7AM, please contact night-coverage at [email or web address], Office  [phone]   01/20/2022, 2:24 PM  LOS: 1 day

## 2022-01-21 DIAGNOSIS — E871 Hypo-osmolality and hyponatremia: Secondary | ICD-10-CM | POA: Diagnosis not present

## 2022-01-21 DIAGNOSIS — R443 Hallucinations, unspecified: Secondary | ICD-10-CM | POA: Diagnosis not present

## 2022-01-21 LAB — TROPONIN I (HIGH SENSITIVITY)
Troponin I (High Sensitivity): 35 ng/L — ABNORMAL HIGH (ref <18)
Troponin I (High Sensitivity): 32 ng/L — ABNORMAL HIGH (ref <18)

## 2022-01-21 LAB — COMPREHENSIVE METABOLIC PANEL
ALT: 27 U/L (ref 0–44)
AST: 41 U/L (ref 15–41)
Albumin: 3.7 g/dL (ref 3.5–5.0)
Alkaline Phosphatase: 62 U/L (ref 38–126)
Anion gap: 14 (ref 5–15)
BUN: 25 mg/dL — ABNORMAL HIGH (ref 8–23)
CO2: 26 mmol/L (ref 22–32)
Calcium: 10 mg/dL (ref 8.9–10.3)
Chloride: 88 mmol/L — ABNORMAL LOW (ref 98–111)
Creatinine, Ser: 1.21 mg/dL — ABNORMAL HIGH (ref 0.44–1.00)
GFR, Estimated: 45 mL/min — ABNORMAL LOW (ref >60)
Glucose, Bld: 133 mg/dL — ABNORMAL HIGH (ref 70–99)
Potassium: 3.3 mmol/L — ABNORMAL LOW (ref 3.5–5.1)
Sodium: 128 mmol/L — ABNORMAL LOW (ref 135–145)
Total Bilirubin: 0.9 mg/dL (ref 0.3–1.2)
Total Protein: 7.4 g/dL (ref 6.5–8.1)

## 2022-01-21 LAB — CBC WITH DIFFERENTIAL/PLATELET
Abs Immature Granulocytes: 0.02 10*3/uL (ref 0.00–0.07)
Basophils Absolute: 0 10*3/uL (ref 0.0–0.1)
Basophils Relative: 1 %
Eosinophils Absolute: 0.1 10*3/uL (ref 0.0–0.5)
Eosinophils Relative: 1 %
HCT: 39.6 % (ref 36.0–46.0)
Hemoglobin: 14.5 g/dL (ref 12.0–15.0)
Immature Granulocytes: 0 %
Lymphocytes Relative: 29 %
Lymphs Abs: 1.5 10*3/uL (ref 0.7–4.0)
MCH: 30.7 pg (ref 26.0–34.0)
MCHC: 36.6 g/dL — ABNORMAL HIGH (ref 30.0–36.0)
MCV: 83.9 fL (ref 80.0–100.0)
Monocytes Absolute: 0.7 10*3/uL (ref 0.1–1.0)
Monocytes Relative: 14 %
Neutro Abs: 2.7 10*3/uL (ref 1.7–7.7)
Neutrophils Relative %: 55 %
Platelets: 253 10*3/uL (ref 150–400)
RBC: 4.72 MIL/uL (ref 3.87–5.11)
RDW: 12.7 % (ref 11.5–15.5)
WBC: 5 10*3/uL (ref 4.0–10.5)
nRBC: 0 % (ref 0.0–0.2)

## 2022-01-21 LAB — MAGNESIUM: Magnesium: 1.8 mg/dL (ref 1.7–2.4)

## 2022-01-21 MED ORDER — BISACODYL 10 MG RE SUPP: 10.0000 mg | Freq: Every day | RECTAL | Status: DC | PRN

## 2022-01-21 MED ORDER — POLYETHYLENE GLYCOL 3350 17 G PO PACK
17.0000 g | PACK | Freq: Every day | ORAL | Status: DC | PRN
  Administered 2022-01-21: 17 g via ORAL
  Filled 2022-01-21: qty 1

## 2022-01-21 MED ORDER — PRAVASTATIN SODIUM 40 MG PO TABS
40.0000 mg | ORAL_TABLET | Freq: Every day | ORAL | Status: DC
  Administered 2022-01-21 – 2022-01-22 (×2): 40 mg via ORAL
  Filled 2022-01-21 (×2): qty 1

## 2022-01-21 MED ORDER — METOPROLOL TARTRATE 50 MG PO TABS
50.0000 mg | ORAL_TABLET | Freq: Two times a day (BID) | ORAL | Status: DC
  Administered 2022-01-21 – 2022-01-23 (×5): 50 mg via ORAL
  Filled 2022-01-21 (×5): qty 1

## 2022-01-21 MED ORDER — SENNOSIDES-DOCUSATE SODIUM 8.6-50 MG PO TABS
1.0000 | ORAL_TABLET | Freq: Two times a day (BID) | ORAL | Status: DC
  Administered 2022-01-21 – 2022-01-23 (×4): 1 via ORAL
  Filled 2022-01-21 (×5): qty 1

## 2022-01-21 MED ORDER — MELATONIN 5 MG PO TABS
5.0000 mg | ORAL_TABLET | Freq: Every evening | ORAL | Status: DC | PRN
  Administered 2022-01-21 – 2022-01-22 (×2): 5 mg via ORAL
  Filled 2022-01-21 (×2): qty 1

## 2022-01-21 MED ORDER — POTASSIUM CHLORIDE 20 MEQ PO PACK
40.0000 meq | PACK | Freq: Once | ORAL | Status: AC
  Administered 2022-01-21: 40 meq via ORAL
  Filled 2022-01-21: qty 2

## 2022-01-21 MED ORDER — ASPIRIN 81 MG PO CHEW
81.0000 mg | CHEWABLE_TABLET | Freq: Every day | ORAL | Status: DC
  Administered 2022-01-22 – 2022-01-23 (×2): 81 mg via ORAL
  Filled 2022-01-21 (×2): qty 1

## 2022-01-21 NOTE — Progress Notes (Addendum)
[facility] Progress Note   Assessment/ Plan:   Hyponatremia: urine Na 33- think collected after fluids.  Regardless- clinical history looks like hypovolemic hyponatremia in the setting of poor PO intake, HCTZ, etc.  Could be exacerbated by Cymbalta too causing mixed picture with SIADH  - in the setting of very low K need to be careful with repletion- we need to replete K first which can cause a relative water diuresis in a hyponatremic patient (no matter the cause of hyponatremia)--> so I've ordered 20 meQ more IV K.  Administration of K and vol resuscitation at the same time can cause overcorrection of Na - K replete now - stop fluids - do not restart HCTZ - fluid restrict - may need to add low- dose Lasix in the future   2.  Hypokalemia:             - likely poor PO intake + HCTZ             - repleted   3.  Hypomagnesemia:             - repleting   4.  AKI, presumed             - holding lisinopril and all other nephrotoxic meds             - Making urine             - back to bseline  5.  Epigastric pain  - ordering pepcid given PPI can cause hypomag  - RUQ [place]- cholelithiasis without cholecystitis  - rest per primary  6.  Dispo: admitted  Subjective:    Eating spaghetti in room.  No complaints   Objective:   BP (!) 160/75 (BP Location: Right Arm)   Pulse (!) 103   Temp 98.4 F (36.9 C) (Oral)   Resp 16   SpO2 100%   Intake/Output Summary (Last 24 hours) at 01/21/2022 1302 Last data filed at 01/21/2022 0900 Gross per 24 hour  Intake 1772.78 ml  Output --  Net 1772.78 ml   Weight change:   Physical Exam: GEN NAD, lying in bed, affect brighter today HEENT EOMI PERRL HOH NECK flat neck veins PULM clear CV RRR ABD soft EXT no LE edema NEURO AAOx3, sharper than yesterday SKIN warm and dry MSK  no effusions  Imaging: DG CHEST PORT 1 VIEW  Result Date: 01/20/2022 CLINICAL DATA:  Chest pain EXAM: PORTABLE CHEST 1 VIEW COMPARISON:  Chest  x-ray ribs 08/23/2013.  Chest x-ray 02/10/2015. FINDINGS: There is a linear band of atelectasis or scarring in the right upper lobe. There is no focal lung infiltrate, pleural effusion or pneumothorax. The cardiomediastinal silhouette is within normal limits. No acute fractures are seen. IMPRESSION: 1.   No acute cardiopulmonary process. Electronically Signed   By: Ronney Asters M.D.   On: 01/20/2022 23:09   US Abdomen Limited RUQ (LIVER/GB)  Result Date: 01/20/2022 CLINICAL DATA:  Gallbladder stones EXAM: ULTRASOUND ABDOMEN LIMITED RIGHT UPPER QUADRANT COMPARISON:  10/13/2017 FINDINGS: Gallbladder: Sludge and stones are seen in the lumen of gallbladder. Gallbladder is contracted. Technologist did not observe any tenderness over the gallbladder. There is no fluid around the gallbladder. Common bile duct: Diameter: 3.2 mm Liver: There is increased echogenicity in liver. No focal abnormalities are seen in visualized portions of liver. Portal vein is patent on color Doppler imaging with normal direction of blood flow towards the liver. Other: None. IMPRESSION: Sludge and stones are seen  in the lumen of gallbladder. There are no imaging signs of acute cholecystitis. There is no dilation of bile ducts. Fatty liver. Electronically Signed   By: Ernie Avena M.D.   On: 01/20/2022 11:14    Labs: BMET Recent Labs  Lab 01/19/22 1508 01/19/22 1732 01/19/22 1958 01/19/22 2327 01/20/22 1157 01/20/22 1921 01/21/22 0921  NA 124* 123* 123* 123* 129* 129* 128*  K 3.7 3.6 3.4* 3.8 3.6 3.5 3.3*  CL 83* 85* 87* 84* 95* 87* 88*  CO2 26 26 25 22 22 27 26   GLUCOSE 100* 117* 105* 94 94 114* 133*  BUN 42* 41* 40* 41* 34* 36* 25*  CREATININE 1.94* 1.85* 1.71* 1.62* 1.34* 1.43* 1.21*  CALCIUM 9.3 9.3 9.2 9.5 8.2* 9.5 10.0  PHOS  --   --   --   --   --  1.7*  --    CBC Recent Labs  Lab 01/18/22 1840 01/19/22 0133 01/19/22 0322 01/21/22 0921  WBC 11.3* 12.2*  --  5.0  NEUTROABS  --  6.7  --  2.7  HGB  16.1* 17.0* 14.3 14.5  HCT 44.1 45.3 42.0 39.6  MCV 82.1 81.3  --  83.9  PLT 295 232  --  253    Medications:     enoxaparin (LOVENOX) injection  40 mg Subcutaneous Q24H   famotidine  20 mg Oral BID   senna-docusate  1 tablet Oral BID    13/01/23 MD 01/21/2022, 1:02 PM

## 2022-01-21 NOTE — Consult Note (Signed)
[facility] New Face-to-Face Psychiatric Evaluation   Service Date: January 21, 2022 LOS:  LOS: 2 days    Assessment  Susan Marsh is a 81 y.o. female admitted medically on 01/18/2022  6:01 PM for electrolyte abnormalities. She carries the psychiatric diagnoses of no prior diagnosed conditions and has a past medical history of Aortic stenosis, CAD s/p stent, and being hard of hearing. Psychiatry was consulted for hallucinations, erratic behavior, and depression by Dr. Starla Link.   Hallucinations emerged suddenly 2 weeks ago in an 81 year old female with no other associated psychotic symptoms, no past psychiatric history aside from depression. Dementia as a cause is unlikely given her high level of functioning. Substance use, including alcohol, negative based on patient/family reports and UDS. Medical illness may be contributing but feel that her AKI and electrolyte problems are likely related to poor PO and are not the cause of her hallucinations. Unclear etiology but patient will benefit from a dopamine blocking agent should the hallucinations persist once her medical illnesses resolve. Of note, patient has no insight or interest in reality testing with these hallucinations.   Diagnoses:  Active Hospital problems: Principal Problem:   Hyponatremia     Plan  ## Safety and Observation Level:  - Based on my clinical evaluation, I estimate the patient to be at low risk of self harm in the current setting - At this time, we recommend a routine level of observation. This decision is based on my review of the chart including patient's history and current presentation, interview of the patient, mental status examination, and consideration of suicide risk including evaluating suicidal ideation, plan, intent, suicidal or self-harm behaviors, risk factors, and protective factors. This judgment is based on our ability to directly address suicide risk, implement suicide prevention  strategies and develop a safety plan while the patient is in the clinical setting. Please contact our team if there is a concern that risk level has changed.   ## Medications:  -Likely will need dopamine blocking agent -QT around 500 but JT interval is normal, low concern  ## Medical Decision Making Capacity:  Not assessed  ## Disposition:  --TBD after medical problem abate   ##Legal Status -- no IVC, patient is her own guardian  Thank you for this consult request. Recommendations have been communicated to the primary team.  We will follow at this time.   Susan Sox, MD   NEW history  Relevant Aspects of Hospital Course:  Admitted on 01/18/2022 for electrolyte abnormalities.  Patient Report:  On interview today, the patient is fully alert and oriented but has issues with attention and logic oriented tasks. She is able to give a coherent social history that appears accurate based on my chart review. She is calm and does not appear to be responding to internal stimuli. She perseverates on how there are bugs in her home that crawl out in the night. Her perception of this situation is clearly distressing to her. She states that she has been sleeping poorly and has been thinking about driving her car off the road to kill herself. She states that she would not act on this impulse. She contracts for safety while admitted here. She is frustrated her family does not believe her. She denies auditory hallucinations and ideas of references and paranoia. She reports her home is a safe place and denies any verbal or physical abuse. Denies substance use.  Collateral information: from Tessie Eke, patient's daughter, contacted per patient's request. [phone].  Social history is confirmed with the daughter as told by the patient. Per daughter, the patient is quite independent. Managing her finances as well as grocery shopping and cooking. She reports that her memory and conversation are intact.    2 weeks ago the patient saw a bug in the house and since then has had intermittent episodes of seeing bugs when they are not there and frantically asks to leave the room, "You've got to get me out of here". She will point at the ceiling and say there are bugs when there is clearly nothing there. This was happening 3-4 times per day and she did not want to be left alone. She feels that the patient has not been eating recently over the past week.   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 But there does not appear to have been an acute increase before 2 weeks ago. No suicide attempts previously, previous psychiatric hospitalizations. There are no guns in her home.    Social History:  From [place] but currently lives in a home with her son in [place]. She feels he leaves her alone all the time and does not care about her. She prefers to have her daughter contacted. She was married for 54 years but her husband died 67 yrs ago.   Family History:  unremarkable The patient's family history includes Heart disease in her brother; Other in her father and mother.  Medical History: Past Medical History:  Diagnosis Date   Aortic stenosis    ASCVD (arteriosclerotic cardiovascular disease)    s/p stent   GERD (gastroesophageal reflux disease)    Hearing loss    R>L   High cholesterol    Hypertension    Osteoarthritis     Surgical History: Past Surgical History:  Procedure Laterality Date   BLADDER SURGERY     CARDIAC SURGERY      Medications:   Current Facility-Administered Medications:    [START ON 01/22/2022] aspirin chewable tablet 81 mg, 81 mg, Oral, Daily, Alekh, Kshitiz, MD   bisacodyl (DULCOLAX) suppository 10 mg, 10 mg, Rectal, Daily PRN, Starla Link, Kshitiz, MD   enoxaparin (LOVENOX) injection 40 mg, 40 mg, Subcutaneous, Q24H, Darrick Meigs, Gagan S, MD, 40 mg at 01/20/22 2111   famotidine (PEPCID) tablet 20 mg, 20 mg, Oral, BID, Madelon Lips, MD,  20 mg at 01/21/22 0952   metoprolol tartrate (LOPRESSOR) tablet 50 mg, 50 mg, Oral, BID, Alekh, Kshitiz, MD, 50 mg at 01/21/22 1721   polyethylene glycol (MIRALAX / GLYCOLAX) packet 17 g, 17 g, Oral, Daily PRN, Starla Link, Kshitiz, MD   pravastatin (PRAVACHOL) tablet 40 mg, 40 mg, Oral, q1800, Alekh, Kshitiz, MD, 40 mg at 01/21/22 1721   senna-docusate (Senokot-S) tablet 1 tablet, 1 tablet, Oral, BID, Starla Link, Kshitiz, MD, 1 tablet at 01/21/22 1242  Allergies: Allergies  Allergen Reactions   Alendronate Sodium     Other reaction(s): GI upset   Other     Cortisporin otic Other reaction(s): GI upset       Objective  Vital signs:  Temp:  [98 F (36.7 C)-98.4 F (36.9 C)] 98.4 F (36.9 C) (11/01 1627) Pulse Rate:  [98-103] 100 (11/01 1627) Resp:  [15-18] 17 (11/01 1627) BP: (141-167)/(75-93) 167/93 (11/01 1627) SpO2:  [97 %-100 %] 97 % (11/01 1627)  Psychiatric Specialty Exam:  Presentation  General Appearance:  Appropriate for Environment; Casual  Eye Contact: Fair  Speech: normal  Speech Volume: Normal  Handedness: Right   Mood and  Affect  Mood: Anxious; Depressed  Affect: Congruent   Thought Process  Thought Processes: Coherent  Descriptions of Associations:Intact  Orientation:Full (Time, Place and Person)  Thought Content:Logical  History of Schizophrenia/Schizoaffective disorder: none Duration of Psychotic Symptoms:2 weeks Hallucinations: as above Ideas of Reference:None  Suicidal Thoughts: denies present plans Homicidal Thoughts: denies  Sensorium  Memory: Immediate Fair; Recent Fair; Remote Fair  Judgment: Good  Insight: Good   Executive Functions  Concentration: Fair  Attention Span: Fair  Recall: [place] of Knowledge: Fair  Language: Fair   Psychomotor Activity  Psychomotor Activity: normal  Assets  Assets: Communication Skills; Desire for Improvement; Social Support; Housing   Sleep  Sleep:   normal   Physical Exam: Physical Exam Constitutional:      Appearance: the patient is not toxic-appearing.  Pulmonary:     Effort: Pulmonary effort is normal.  Neurological:     General: No focal deficit present.     Mental Status: the patient is alert and oriented to person, place, and time.   Review of Systems  Respiratory:  Negative for shortness of breath.   Cardiovascular:  Negative for chest pain.  Gastrointestinal:  Negative for abdominal pain, constipation, diarrhea, nausea and vomiting.  Neurological:  Negative for headaches.   Blood pressure (!) 167/93, pulse 100, temperature 98.4 F (36.9 C), temperature source Oral, resp. rate 17, SpO2 97 %. There is no height or weight on file to calculate BMI.   Susan Sox, MD PGY-2

## 2022-01-21 NOTE — Progress Notes (Signed)
PROGRESS NOTE    Susan Marsh  ZWC:[id number] DOB: 13-Aug-1940 DOA: 01/18/2022 PCP: [name], MD   Brief Narrative:  81 year old female with medical history of aortic stenosis, CAD, s/p stent, GERD, hyperlipidemia, depression, hypertension came to ED with erratic behavior, visual hallucinations and poor p.o. intake.  Patient was recently seen by psych today in the ED who recommended no need for inpatient psychiatric hospitalization and recommended outpatient psychiatry follow-up. On presentation to the ED this time, her sodium was 115.  She was started on IV fluids.  Nephrology was consulted.  Assessment & Plan:   Hyponatremia -Sodium 115 on presentation.  Nephrology following.  Sodium 128 this morning.  Off IV fluids. -HCTZ has been discontinued.  Cymbalta on hold as well.  AKI Dehydration -Lisinopril/HCTZ on hold. -Treated with IV fluids.  Creatinine much improved today.  Nephrology following.  Acute metabolic encephalopathy Hallucinations -Presented with hallucinations and erratic behavior -Improving.  Psychiatry has been consulted.  Monitor mental status.  Fall precautions.  Cholelithiasis Abdominal pain -No signs of cholecystitis on ultrasound.  Continue famotidine.  Might need outpatient general surgery evaluation  Hypokalemia -Replace.  Repeat a.m. labs  Hypomagnesemia -Replace.  Repeat a.m. labs  Leukocytosis -Resolved  Hypertension -Monitor blood pressure.  Resume metoprolol  Hyperlipidemia -Continue statin   CAD s/p stent placement -Continue aspirin, statin, metoprolol   DVT prophylaxis: Lovenox Code Status: Full Family Communication: None bedside Disposition Plan: Status is: Inpatient Remains inpatient appropriate because: Of severity of illness  Consultants: Nephrology.  Consult psychiatry  Procedures: None  Antimicrobials: None   Subjective: Patient seen and examined at bedside.  Poor historian.  Slow to respond.  No agitation,  seizures, vomiting reported.  Objective: Vitals:   01/20/22 1841 01/20/22 2018 01/21/22 0515 01/21/22 0841  BP: (!) 148/78 (!) 141/83 (!) 154/82 (!) 160/75  Pulse: 99 (!) 101 98 (!) 103  Resp: 15 18 18 16   Temp: 98 F (36.7 C) 98.2 F (36.8 C) 98.2 F (36.8 C) 98.4 F (36.9 C)  TempSrc: Oral Oral Oral Oral  SpO2: 100% 98% 99% 100%    Intake/Output Summary (Last 24 hours) at 01/21/2022 1339 Last data filed at 01/21/2022 1300 Gross per 24 hour  Intake 2012.78 ml  Output --  Net 2012.78 ml   There were no vitals filed for this visit.  Examination:  General exam: Appears calm and comfortable.  On room air.  Looks chronically ill and deconditioned. Respiratory system: Bilateral decreased breath sounds at bases with some scattered crackles Cardiovascular system: S1 & S2 heard, Rate controlled currently Gastrointestinal system: Abdomen is nondistended, soft and nontender. Normal bowel sounds heard. Extremities: No cyanosis, clubbing, edema  Central nervous system: Awake, slow to respond but answers questions appropriately.  No focal neurological deficits. Moving extremities Skin: No rashes, lesions or ulcers Psychiatry: Flat affect.  Not agitated.   Data Reviewed: I have personally reviewed following labs and imaging studies  CBC: Recent Labs  Lab 01/18/22 1840 01/19/22 0133 01/19/22 0322 01/21/22 0921  WBC 11.3* 12.2*  --  5.0  NEUTROABS  --  6.7  --  2.7  HGB 16.1* 17.0* 14.3 14.5  HCT 44.1 45.3 42.0 39.6  MCV 82.1 81.3  --  83.9  PLT 295 232  --  253   Basic Metabolic Panel: Recent Labs  Lab 01/18/22 2020 01/19/22 0314 01/19/22 0322 01/19/22 1958 01/19/22 2327 01/20/22 1157 01/20/22 1921 01/21/22 0921  NA 117* 118*   < > 123* 123* 129* 129* 128*  K  2.4* 3.2*   < > 3.4* 3.8 3.6 3.5 3.3*  CL 70* 73*   < > 87* 84* 95* 87* 88*  CO2 26 25   < > 25 22 22 27 26   GLUCOSE 121* 116*   < > 105* 94 94 114* 133*  BUN 50* 48*   < > 40* 41* 34* 36* 25*  CREATININE  2.73* 2.27*   < > 1.71* 1.62* 1.34* 1.43* 1.21*  CALCIUM 9.9 9.2   < > 9.2 9.5 8.2* 9.5 10.0  MG 1.4* 1.9  --   --   --   --   --  1.8  PHOS  --   --   --   --   --   --  1.7*  --    < > = values in this interval not displayed.   GFR: CrCl cannot be calculated (Unknown ideal weight.). Liver Function Tests: Recent Labs  Lab 01/18/22 1840 01/20/22 1921 01/21/22 0921  AST 45*  --  41  ALT 28  --  27  ALKPHOS 66  --  62  BILITOT 1.0  --  0.9  PROT 8.2*  --  7.4  ALBUMIN 4.2 3.5 3.7   No results for input(s): "LIPASE", "AMYLASE" in the last 168 hours. No results for input(s): "AMMONIA" in the last 168 hours. Coagulation Profile: No results for input(s): "INR", "PROTIME" in the last 168 hours. Cardiac Enzymes: No results for input(s): "CKTOTAL", "CKMB", "CKMBINDEX", "TROPONINI" in the last 168 hours. BNP (last 3 results) No results for input(s): "PROBNP" in the last 8760 hours. HbA1C: No results for input(s): "HGBA1C" in the last 72 hours. CBG: No results for input(s): "GLUCAP" in the last 168 hours. Lipid Profile: No results for input(s): "CHOL", "HDL", "LDLCALC", "TRIG", "CHOLHDL", "LDLDIRECT" in the last 72 hours. Thyroid Function Tests: Recent Labs    01/19/22 0314  TSH 0.910   Anemia Panel: No results for input(s): "VITAMINB12", "FOLATE", "FERRITIN", "TIBC", "IRON", "RETICCTPCT" in the last 72 hours. Sepsis Labs: Recent Labs  Lab 01/19/22 0314 01/19/22 0540  PROCALCITON 0.43  --   LATICACIDVEN 2.3* 1.6    No results found for this or any previous visit (from the past 240 hour(s)).       Radiology Studies: DG CHEST PORT 1 VIEW  Result Date: 01/20/2022 CLINICAL DATA:  Chest pain EXAM: PORTABLE CHEST 1 VIEW COMPARISON:  Chest x-ray ribs 08/23/2013.  Chest x-ray 02/10/2015. FINDINGS: There is a linear band of atelectasis or scarring in the right upper lobe. There is no focal lung infiltrate, pleural effusion or pneumothorax. The cardiomediastinal silhouette is  within normal limits. No acute fractures are seen. IMPRESSION: 1.   No acute cardiopulmonary process. Electronically Signed   By: Ronney Asters M.D.   On: 01/20/2022 23:09   US Abdomen Limited RUQ (LIVER/GB)  Result Date: 01/20/2022 CLINICAL DATA:  Gallbladder stones EXAM: ULTRASOUND ABDOMEN LIMITED RIGHT UPPER QUADRANT COMPARISON:  10/13/2017 FINDINGS: Gallbladder: Sludge and stones are seen in the lumen of gallbladder. Gallbladder is contracted. Technologist did not observe any tenderness over the gallbladder. There is no fluid around the gallbladder. Common bile duct: Diameter: 3.2 mm Liver: There is increased echogenicity in liver. No focal abnormalities are seen in visualized portions of liver. Portal vein is patent on color Doppler imaging with normal direction of blood flow towards the liver. Other: None. IMPRESSION: Sludge and stones are seen in the lumen of gallbladder. There are no imaging signs of acute cholecystitis. There is no dilation  of bile ducts. Fatty liver. Electronically Signed   By: Ernie Avena M.D.   On: 01/20/2022 11:14        Scheduled Meds:  enoxaparin (LOVENOX) injection  40 mg Subcutaneous Q24H   famotidine  20 mg Oral BID   senna-docusate  1 tablet Oral BID   Continuous Infusions:        Glade Lloyd, MD [facility] 01/21/2022, 1:39 PM

## 2022-01-21 NOTE — Evaluation (Addendum)
Physical Therapy Evaluation & Discharge Patient Details Name: Susan Marsh MRN: [id number] DOB: 04-18-1940 Today's Date: 01/21/2022  History of Present Illness  81 y/o female presented to ED on 01/18/22 for AMS, visual hallucinations, and anxiety after being seen by [facility]. Found to have symptomatic hyponatremia and hypokalemia. PMH: CAD s/p stent, HTN  Clinical Impression  Patient admitted with the above. PTA, patient lives with son and reports independence. No family present to determine baseline cognition but answers questions appropriately and is [facility] which makes it difficult to fully assess. Patient overall functioning at supervision level for mobility with no AD. Patient feels her mobility is at her baseline and denies any falls in past 6 months. No further skilled PT needs identified acutely. No PT follow up recommended at this time. Will defer further mobility to nursing staff and mobility specialists.        Recommendations for follow up therapy are one component of a multi-disciplinary discharge planning process, led by the attending physician.  Recommendations may be updated based on patient status, additional functional criteria and insurance authorization.  Follow Up Recommendations No PT follow up      Assistance Recommended at Discharge PRN  Patient can return home with the following       Equipment Recommendations None recommended by PT  Recommendations for Other Services       Functional Status Assessment Patient has had a recent decline in their functional status and demonstrates the ability to make significant improvements in function in a reasonable and predictable amount of time.     Precautions / Restrictions Precautions Precautions: Fall Restrictions Weight Bearing Restrictions: No      Mobility  Bed Mobility Overal bed mobility: Modified Independent                  Transfers Overall transfer level: Needs assistance Equipment used:  None Transfers: Sit to/from Stand Sit to Stand: Supervision                Ambulation/Gait Ambulation/Gait assistance: Supervision Gait Distance (Feet): 400 Feet Assistive device: None Gait Pattern/deviations: Drifts right/left Gait velocity: decreased        Stairs            Wheelchair Mobility    Modified Rankin (Stroke Patients Only)       Balance Overall balance assessment: Mild deficits observed, not formally tested                                           Pertinent Vitals/Pain Pain Assessment Pain Assessment: No/denies pain    Home Living Family/patient expects to be discharged to:: Private residence Living Arrangements: Children Available Help at Discharge: Family Type of Home: House Home Access: Level entry       Home Layout: One level Home Equipment: None      Prior Function Prior Level of Function : Independent/Modified Independent             Mobility Comments: no AD       Hand Dominance        Extremity/Trunk Assessment   Upper Extremity Assessment Upper Extremity Assessment: Overall WFL for tasks assessed    Lower Extremity Assessment Lower Extremity Assessment: Generalized weakness    Cervical / Trunk Assessment Cervical / Trunk Assessment: Kyphotic  Communication   Communication: HOH  Cognition Arousal/Alertness: Awake/alert Behavior During Therapy: WFL for tasks  assessed/performed Overall Cognitive Status: No family/caregiver present to determine baseline cognitive functioning                                 General Comments: answers questions appropriately. Seems generally confused but difficult to fully assess due to [place] Comments      Exercises     Assessment/Plan    PT Assessment Patient does not need any further PT services  PT Problem List         PT Treatment Interventions      PT Goals (Current goals can be found in the Care Plan  section)  Acute Rehab PT Goals Patient Stated Goal: to go home PT Goal Formulation: All assessment and education complete, DC therapy    Frequency       Co-evaluation               AM-PAC PT "6 Clicks" Mobility  Outcome Measure Help needed turning from your back to your side while in a flat bed without using bedrails?: None Help needed moving from lying on your back to sitting on the side of a flat bed without using bedrails?: None Help needed moving to and from a bed to a chair (including a wheelchair)?: A Little Help needed standing up from a chair using your arms (e.g., wheelchair or bedside chair)?: A Little Help needed to walk in hospital room?: A Little Help needed climbing 3-5 steps with a railing? : A Little 6 Click Score: 20    End of Session   Activity Tolerance: Patient tolerated treatment well Patient left: in bed;with call bell/phone within reach;with bed alarm set Nurse Communication: Mobility status PT Visit Diagnosis: Muscle weakness (generalized) (M62.81)    Time: 1551-1610 PT Time Calculation (min) (ACUTE ONLY): 19 min   Charges:   PT Evaluation $PT Eval Low Complexity: 1 Low          Alias Villagran A. Gilford Rile PT, DPT Acute Rehabilitation Services Office [phone]   Linna Hoff 01/21/2022, 4:53 PM

## 2022-01-21 NOTE — Progress Notes (Signed)
  Transition of Care (TOC) Screening Note   Patient Details  Name: Susan Marsh Date of Birth: March 29, 1940   Transition of Care [facility]) CM/SW Contact:    Tom-Johnson, Renea Ee, RN Phone Number: 01/21/2022, 2:28 PM  Patient is admitted with Acute Metabolic Encephalopathy with Hallucinations and Erratic behavior. Found to have AKI. Nephrology, Psychology, Neurology following.   Transition of Care Department [facility]) has reviewed patient and no TOC needs or recommendations have been identified at this time. TOC will continue to monitor patient advancement through interdisciplinary progression rounds. If new patient transition needs arise, please place a TOC consult.

## 2022-01-22 DIAGNOSIS — E871 Hypo-osmolality and hyponatremia: Secondary | ICD-10-CM | POA: Diagnosis not present

## 2022-01-22 LAB — BASIC METABOLIC PANEL
Anion gap: 15 (ref 5–15)
BUN: 22 mg/dL (ref 8–23)
CO2: 26 mmol/L (ref 22–32)
Calcium: 9.7 mg/dL (ref 8.9–10.3)
Chloride: 91 mmol/L — ABNORMAL LOW (ref 98–111)
Creatinine, Ser: 1.04 mg/dL — ABNORMAL HIGH (ref 0.44–1.00)
GFR, Estimated: 54 mL/min — ABNORMAL LOW (ref >60)
Glucose, Bld: 111 mg/dL — ABNORMAL HIGH (ref 70–99)
Potassium: 4.2 mmol/L (ref 3.5–5.1)
Sodium: 132 mmol/L — ABNORMAL LOW (ref 135–145)

## 2022-01-22 LAB — MAGNESIUM: Magnesium: 1.7 mg/dL (ref 1.7–2.4)

## 2022-01-22 MED ORDER — ACETAMINOPHEN 325 MG PO TABS
650.0000 mg | ORAL_TABLET | Freq: Four times a day (QID) | ORAL | Status: DC | PRN
  Administered 2022-01-22: 650 mg via ORAL
  Filled 2022-01-22: qty 2

## 2022-01-22 NOTE — Consult Note (Signed)
[facility] follow up Face-to-Face Psychiatric Evaluation   Service Date: January 22, 2022 LOS:  LOS: 3 days    Assessment  Susan Marsh is a 81 y.o. female admitted medically on 01/18/2022  6:01 PM for electrolyte abnormalities. She carries the psychiatric diagnoses of no prior diagnosed conditions and has a past medical history of Aortic stenosis, CAD s/p stent, and being hard of hearing. Psychiatry was consulted for hallucinations, erratic behavior, and depression by Dr. Starla Link.   Hallucinations emerged suddenly 2 weeks ago in an 81 year old female with no other associated psychotic symptoms, no past psychiatric history aside from depression that has been stable. Dementia as a cause was considered unlikely given her reported high level of functioning. However, she has demonstrated gross memory deficits and will need a dementia evaluation outside th hospital. Substance use, including alcohol, negative based on patient/family reports and UDS. Medical illness may be contributing but feel that her AKI and electrolyte problems are likely related to poor PO and are not the cause of her hallucinations. Unclear etiology but patient will benefit from a dopamine blocking agent should the hallucinations persist once her medical illnesses resolve. Of note, patient has no insight or interest in reality testing with these hallucinations.   11/2: electrolytes normalizing, renal function greatly improved. Patient was guarded today but reports a strong desire to return home. She denies feeling concerned about the "bugs" in her home. She is obstinate and continues to demonstrate no insight into the hallucinations she had. She refuses medication but may be amenable to following up with specialists outpatient.  Called patient's daughter again and discussed that she does not meet IVC criteria and wishes to return home. Told her to bring the patient to the [facility] if problems arise. Asked LCSW to  provide referral to [facility] psychiatry. Discussed need for neurology f/u with primary team.  Diagnoses:  Active Hospital problems: Principal Problem:   Hyponatremia     Plan  ## Safety and Observation Level:  - Based on my clinical evaluation, I estimate the patient to be at low risk of self harm in the current setting - At this time, we recommend a routine level of observation. This decision is based on my review of the chart including patient's history and current presentation, interview of the patient, mental status examination, and consideration of suicide risk including evaluating suicidal ideation, plan, intent, suicidal or self-harm behaviors, risk factors, and protective factors. This judgment is based on our ability to directly address suicide risk, implement suicide prevention strategies and develop a safety plan while the patient is in the clinical setting. Please contact our team if there is a concern that risk level has changed.   ## Medications:  -Likely will need dopamine blocking agent -QT around 500 but JT interval is normal, low concern  ## Medical Decision Making Capacity:  Not assessed  ## Disposition:  -- does not meet criteria for inpatient hospitalization   ##Legal Status -- no IVC, patient is her own guardian  Thank you for this consult request. Recommendations have been communicated to the primary team.  We will sign off at this time.   Corky Sox, MD   NEW history  Relevant Aspects of Hospital Course:  Admitted on 01/18/2022 for electrolyte abnormalities.  Patient Report:  On interview today, the patient is fully alert and oriented but has issues with attention and logic oriented tasks. She is able to give a coherent social history that appears accurate based  on my chart review. She is calm and does not appear to be responding to internal stimuli. She perseverates on how there are bugs in her home that crawl out in the night. Her perception of  this situation is clearly distressing to her. She states that she has been sleeping poorly and has been thinking about driving her car off the road to kill herself. She states that she would not act on this impulse. She contracts for safety while admitted here. She is frustrated her family does not believe her. She denies auditory hallucinations and ideas of references and paranoia. She reports her home is a safe place and denies any verbal or physical abuse. Denies substance use.  Collateral information: from Roseanna Rainbow, patient's daughter, contacted per patient's request. [phone]. Social history is confirmed with the daughter as told by the patient. Per daughter, the patient is quite independent. Managing her finances as well as grocery shopping and cooking. She reports that her memory and conversation are intact.   2 weeks ago the patient saw a bug in the house and since then has had intermittent episodes of seeing bugs when they are not there and frantically asks to leave the room, "You've got to get me out of here". She will point at the ceiling and say there are bugs when there is clearly nothing there. This was happening 3-4 times per day and she did not want to be left alone. She feels that the patient has not been eating recently over the past week.   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 But there does not appear to have been an acute increase before 2 weeks ago. No suicide attempts previously, previous psychiatric hospitalizations. There are no guns in her home.    Social History:  From [place] but currently lives in a home with her son in [place]. She feels he leaves her alone all the time and does not care about her. She prefers to have her daughter contacted. She was married for 42 years but her husband died 15 yrs ago.   Family History:  unremarkable The patient's family history includes Heart disease in her brother; Other in her  father and mother.  Medical History: Past Medical History:  Diagnosis Date   Aortic stenosis    ASCVD (arteriosclerotic cardiovascular disease)    s/p stent   GERD (gastroesophageal reflux disease)    Hearing loss    R>L   High cholesterol    Hypertension    Osteoarthritis     Surgical History: Past Surgical History:  Procedure Laterality Date   BLADDER SURGERY     CARDIAC SURGERY      Medications:   Current Facility-Administered Medications:    aspirin chewable tablet 81 mg, 81 mg, Oral, Daily, Alekh, Kshitiz, MD, 81 mg at 01/22/22 1018   bisacodyl (DULCOLAX) suppository 10 mg, 10 mg, Rectal, Daily PRN, Hanley Ben, Kshitiz, MD   enoxaparin (LOVENOX) injection 40 mg, 40 mg, Subcutaneous, Q24H, Sharl Ma, Gagan S, MD, 40 mg at 01/21/22 2118   famotidine (PEPCID) tablet 20 mg, 20 mg, Oral, BID, Bufford Buttner, MD, 20 mg at 01/22/22 1017   melatonin tablet 5 mg, 5 mg, Oral, QHS PRN, Dow Adolph N, DO, 5 mg at 01/21/22 2117   metoprolol tartrate (LOPRESSOR) tablet 50 mg, 50 mg, Oral, BID, Alekh, Kshitiz, MD, 50 mg at 01/22/22 1018   polyethylene glycol (MIRALAX / GLYCOLAX) packet 17 g, 17 g, Oral, Daily PRN, Glade Lloyd, MD,  17 g at 01/21/22 2118   pravastatin (PRAVACHOL) tablet 40 mg, 40 mg, Oral, q1800, Hanley Ben, Kshitiz, MD, 40 mg at 01/21/22 1721   senna-docusate (Senokot-S) tablet 1 tablet, 1 tablet, Oral, BID, Hanley Ben, Kshitiz, MD, 1 tablet at 01/21/22 2118  Allergies: Allergies  Allergen Reactions   Alendronate Sodium     Other reaction(s): GI upset   Other     Cortisporin otic Other reaction(s): GI upset       Objective  Vital signs:  Temp:  [98.1 F (36.7 C)-98.5 F (36.9 C)] 98.1 F (36.7 C) (11/02 1655) Pulse Rate:  [68-83] 68 (11/02 1655) Resp:  [13-18] 16 (11/02 1655) BP: (147-187)/(77-85) 187/85 (11/02 1655) SpO2:  [97 %-100 %] 100 % (11/02 1655)  Psychiatric Specialty Exam:  Presentation  General Appearance:  Appropriate for Environment; Casual  Eye  Contact: Fair  Speech: normal  Speech Volume: Normal  Handedness: Right   Mood and Affect  Mood: Anxious; Depressed  Affect: Congruent   Thought Process  Thought Processes: Coherent  Descriptions of Associations:Intact  Orientation:Full (Time, Place and Person)  Thought Content:Logical  History of Schizophrenia/Schizoaffective disorder: none Duration of Psychotic Symptoms:2 weeks Hallucinations: as above Ideas of Reference:None  Suicidal Thoughts: denies Si of any form Homicidal Thoughts: denies  Sensorium  Memory: Poor  Judgment: poor  Insight: poor  Art therapist  Concentration: Fair  Attention Span: Fair  Recall: Poor  Fund of Knowledge: Fair  Language: Fair   Psychomotor Activity  Psychomotor Activity: normal  Assets  Assets: Manufacturing systems engineer; Desire for Improvement; Social Support; Housing   Sleep  Sleep:  normal   Physical Exam: Physical Exam Constitutional:      Appearance: the patient is not toxic-appearing.  Pulmonary:     Effort: Pulmonary effort is normal.  Neurological:     General: No focal deficit present.     Mental Status: the patient is alert and oriented to person, place, and time.   Review of Systems  Respiratory:  Negative for shortness of breath.   Cardiovascular:  Negative for chest pain.  Gastrointestinal:  Negative for abdominal pain, constipation, diarrhea, nausea and vomiting.  Neurological:  Negative for headaches.   Blood pressure (!) 187/85, pulse 68, temperature 98.1 F (36.7 C), temperature source Oral, resp. rate 16, SpO2 100 %. There is no height or weight on file to calculate BMI.   Carlyn Reichert, MD PGY-2

## 2022-01-22 NOTE — Progress Notes (Signed)
[facility] Progress Note   Assessment/ Plan:   Hyponatremia: urine Na 33- think collected after fluids.  Regardless- clinical history looks like hypovolemic hyponatremia in the setting of poor PO intake, HCTZ, etc.  Could be exacerbated by Cymbalta too causing mixed picture with SIADH  - in the setting of very low K need to be careful with repletion- we need to replete K first which can cause a relative water diuresis in a hyponatremic patient (no matter the cause of hyponatremia)--> so I've ordered 20 meQ more IV K.  Administration of K and vol resuscitation at the same time can cause overcorrection of Na - K replete now - stop fluids - do not restart HCTZ - fluid restrict - may need to add low- dose Lasix in the future - nothing further to add- sign off, call with questions   2.  Hypokalemia:             - likely poor PO intake + HCTZ             - repleted   3.  Hypomagnesemia:             - repleting   4.  AKI, presumed             - holding lisinopril and all other nephrotoxic meds             - Making urine             - back to bseline--> Ok to add back lisinopril if needed  5.  Epigastric pain  - ordering pepcid given PPI can cause hypomag  - RUQ [place]- cholelithiasis without cholecystitis  - rest per primary  6.  Dispo: admitted  Subjective:    Na 132 and cr at baseline now.  No complaints.     Objective:   BP (!) 147/77   Pulse 75   Temp 98.5 F (36.9 C) (Oral)   Resp 13   SpO2 100%   Intake/Output Summary (Last 24 hours) at 01/22/2022 1237 Last data filed at 01/22/2022 1021 Gross per 24 hour  Intake 1075 ml  Output 1100 ml  Net -25 ml   Weight change:   Physical Exam: GEN NAD, lying in bed, affect brighter today HEENT EOMI PERRL HOH NECK flat neck veins PULM clear CV RRR ABD soft EXT no LE edema NEURO AAOx3, sharper than yesterday SKIN warm and dry MSK  no effusions  Imaging: DG CHEST PORT 1 VIEW  Result Date:  01/20/2022 CLINICAL DATA:  Chest pain EXAM: PORTABLE CHEST 1 VIEW COMPARISON:  Chest x-ray ribs 08/23/2013.  Chest x-ray 02/10/2015. FINDINGS: There is a linear band of atelectasis or scarring in the right upper lobe. There is no focal lung infiltrate, pleural effusion or pneumothorax. The cardiomediastinal silhouette is within normal limits. No acute fractures are seen. IMPRESSION: 1.   No acute cardiopulmonary process. Electronically Signed   By: Ronney Asters M.D.   On: 01/20/2022 23:09    Labs: BMET Recent Labs  Lab 01/19/22 1732 01/19/22 1958 01/19/22 2327 01/20/22 1157 01/20/22 1921 01/21/22 0921 01/22/22 0514  NA 123* 123* 123* 129* 129* 128* 132*  K 3.6 3.4* 3.8 3.6 3.5 3.3* 4.2  CL 85* 87* 84* 95* 87* 88* 91*  CO2 26 25 22 22 27 26 26   GLUCOSE 117* 105* 94 94 114* 133* 111*  BUN 41* 40* 41* 34* 36* 25* 22  CREATININE 1.85* 1.71* 1.62* 1.34* 1.43* 1.21* 1.04*  CALCIUM 9.3 9.2 9.5 8.2* 9.5 10.0 9.7  PHOS  --   --   --   --  1.7*  --   --    CBC Recent Labs  Lab 01/18/22 1840 01/19/22 0133 01/19/22 0322 01/21/22 0921  WBC 11.3* 12.2*  --  5.0  NEUTROABS  --  6.7  --  2.7  HGB 16.1* 17.0* 14.3 14.5  HCT 44.1 45.3 42.0 39.6  MCV 82.1 81.3  --  83.9  PLT 295 232  --  253    Medications:     aspirin  81 mg Oral Daily   enoxaparin (LOVENOX) injection  40 mg Subcutaneous Q24H   famotidine  20 mg Oral BID   metoprolol tartrate  50 mg Oral BID   pravastatin  40 mg Oral q1800   senna-docusate  1 tablet Oral BID    Bufford Buttner MD 01/22/2022, 12:37 PM

## 2022-01-22 NOTE — Care Management Important Message (Signed)
Important Message  Patient Details  Name: LUIS SAMI MRN: [id number] Date of Birth: 1940-07-24   Medicare Important Message Given:  Yes     Jermari Tamargo 01/22/2022, 2:18 PM

## 2022-01-22 NOTE — Progress Notes (Signed)
PROGRESS NOTE    Susan Marsh  NKN:[id number] DOB: January 19, 1941 DOA: 01/18/2022 PCP: [name], MD   Brief Narrative:  81 year old female with medical history of aortic stenosis, CAD, s/p stent, GERD, hyperlipidemia, depression, hypertension came to ED with erratic behavior, visual hallucinations and poor p.o. intake.  Patient was recently seen by psych today in the ED who recommended no need for inpatient psychiatric hospitalization and recommended outpatient psychiatry follow-up. On presentation to the ED this time, her sodium was 115.  She was started on IV fluids.  Nephrology was consulted.  Assessment & Plan:   Hyponatremia -Sodium 115 on presentation.  Nephrology following.  Sodium 132 this morning.  Off IV fluids. -HCTZ has been discontinued.  Cymbalta on hold as well.  AKI Dehydration -Lisinopril/HCTZ on hold. -Treated with IV fluids.  Creatinine much improved today to 1.04.  Nephrology following.  Acute metabolic encephalopathy Hallucinations -Presented with hallucinations and erratic behavior -Improving.  Psychiatry following.  Monitor mental status.  Fall precautions.  Cholelithiasis Abdominal pain -No signs of cholecystitis on ultrasound.  Continue famotidine.  Might need outpatient general surgery evaluation  Hypokalemia -Improved  Leukocytosis -Resolved  Hypertension -Monitor blood pressure.  Continue metoprolol  Hyperlipidemia -Continue statin   CAD s/p stent placement -Continue aspirin, statin, metoprolol   DVT prophylaxis: Lovenox Code Status: Full Family Communication: None bedside Disposition Plan: Status is: Inpatient Remains inpatient appropriate because: Of severity of illness  Consultants: Nephrology.  psychiatry  Procedures: None  Antimicrobials: None   Subjective: Patient seen and examined at bedside.  Poor historian.  Slow to respond.  No fever, vomiting, agitation or seizures reported.  Objective: Vitals:   01/21/22  0841 01/21/22 1627 01/21/22 2147 01/22/22 0448  BP: (!) 160/75 (!) 167/93 (!) 148/83 (!) 152/81  Pulse: (!) 103 100 83 73  Resp: 16 17 16 18   Temp: 98.4 F (36.9 C) 98.4 F (36.9 C) 98.4 F (36.9 C) 98.3 F (36.8 C)  TempSrc: Oral Oral Oral Oral  SpO2: 100% 97% 97% 99%    Intake/Output Summary (Last 24 hours) at 01/22/2022 0804 Last data filed at 01/22/2022 0600 Gross per 24 hour  Intake 700 ml  Output 1000 ml  Net -300 ml    There were no vitals filed for this visit.  Examination:  General: On room air.  No distress.  Looks chronically ill and deconditioned. ENT/neck: No thyromegaly.  JVD is not elevated  respiratory: Decreased breath sounds at bases bilaterally with some crackles; no wheezing  CVS: S1-S2 heard, rate controlled currently Abdominal: Soft, nontender, slightly distended; no organomegaly,  bowel sounds are heard Extremities: Trace lower extremity edema; no cyanosis  CNS: Awake; still slow to respond but answers some questions.  Poor historian.  No focal neurologic deficit.  Moves extremities Lymph: No obvious lymphadenopathy Skin: No obvious ecchymosis/lesions  psych: Extremely flat affect.  Currently not agitated.   Musculoskeletal: No obvious joint swelling/deformity    Data Reviewed: I have personally reviewed following labs and imaging studies  CBC: Recent Labs  Lab 01/18/22 1840 01/19/22 0133 01/19/22 0322 01/21/22 0921  WBC 11.3* 12.2*  --  5.0  NEUTROABS  --  6.7  --  2.7  HGB 16.1* 17.0* 14.3 14.5  HCT 44.1 45.3 42.0 39.6  MCV 82.1 81.3  --  83.9  PLT 295 232  --  253    Basic Metabolic Panel: Recent Labs  Lab 01/18/22 2020 01/19/22 0314 01/19/22 0322 01/19/22 2327 01/20/22 1157 01/20/22 1921 01/21/22 13/01/23 01/22/22 13/02/23  NA 117* 118*   < > 123* 129* 129* 128* 132*  K 2.4* 3.2*   < > 3.8 3.6 3.5 3.3* 4.2  CL 70* 73*   < > 84* 95* 87* 88* 91*  CO2 26 25   < > 22 22 27 26 26   GLUCOSE 121* 116*   < > 94 94 114* 133* 111*  BUN 50*  48*   < > 41* 34* 36* 25* 22  CREATININE 2.73* 2.27*   < > 1.62* 1.34* 1.43* 1.21* 1.04*  CALCIUM 9.9 9.2   < > 9.5 8.2* 9.5 10.0 9.7  MG 1.4* 1.9  --   --   --   --  1.8 1.7  PHOS  --   --   --   --   --  1.7*  --   --    < > = values in this interval not displayed.    GFR: CrCl cannot be calculated (Unknown ideal weight.). Liver Function Tests: Recent Labs  Lab 01/18/22 1840 01/20/22 1921 01/21/22 0921  AST 45*  --  41  ALT 28  --  27  ALKPHOS 66  --  62  BILITOT 1.0  --  0.9  PROT 8.2*  --  7.4  ALBUMIN 4.2 3.5 3.7    No results for input(s): "LIPASE", "AMYLASE" in the last 168 hours. No results for input(s): "AMMONIA" in the last 168 hours. Coagulation Profile: No results for input(s): "INR", "PROTIME" in the last 168 hours. Cardiac Enzymes: No results for input(s): "CKTOTAL", "CKMB", "CKMBINDEX", "TROPONINI" in the last 168 hours. BNP (last 3 results) No results for input(s): "PROBNP" in the last 8760 hours. HbA1C: No results for input(s): "HGBA1C" in the last 72 hours. CBG: No results for input(s): "GLUCAP" in the last 168 hours. Lipid Profile: No results for input(s): "CHOL", "HDL", "LDLCALC", "TRIG", "CHOLHDL", "LDLDIRECT" in the last 72 hours. Thyroid Function Tests: No results for input(s): "TSH", "T4TOTAL", "FREET4", "T3FREE", "THYROIDAB" in the last 72 hours.  Anemia Panel: No results for input(s): "VITAMINB12", "FOLATE", "FERRITIN", "TIBC", "IRON", "RETICCTPCT" in the last 72 hours. Sepsis Labs: Recent Labs  Lab 01/19/22 0314 01/19/22 0540  PROCALCITON 0.43  --   LATICACIDVEN 2.3* 1.6     No results found for this or any previous visit (from the past 240 hour(s)).       Radiology Studies: DG CHEST PORT 1 VIEW  Result Date: 01/20/2022 CLINICAL DATA:  Chest pain EXAM: PORTABLE CHEST 1 VIEW COMPARISON:  Chest x-ray ribs 08/23/2013.  Chest x-ray 02/10/2015. FINDINGS: There is a linear band of atelectasis or scarring in the right upper lobe.  There is no focal lung infiltrate, pleural effusion or pneumothorax. The cardiomediastinal silhouette is within normal limits. No acute fractures are seen. IMPRESSION: 1.   No acute cardiopulmonary process. Electronically Signed   By: 02/12/2015 M.D.   On: 01/20/2022 23:09   01/22/2022 Abdomen Limited RUQ (LIVER/GB)  Result Date: 01/20/2022 CLINICAL DATA:  Gallbladder stones EXAM: ULTRASOUND ABDOMEN LIMITED RIGHT UPPER QUADRANT COMPARISON:  10/13/2017 FINDINGS: Gallbladder: Sludge and stones are seen in the lumen of gallbladder. Gallbladder is contracted. Technologist did not observe any tenderness over the gallbladder. There is no fluid around the gallbladder. Common bile duct: Diameter: 3.2 mm Liver: There is increased echogenicity in liver. No focal abnormalities are seen in visualized portions of liver. Portal vein is patent on color Doppler imaging with normal direction of blood flow towards the liver. Other: None. IMPRESSION: Sludge and stones are  seen in the lumen of gallbladder. There are no imaging signs of acute cholecystitis. There is no dilation of bile ducts. Fatty liver. Electronically Signed   By: Elmer Picker M.D.   On: 01/20/2022 11:14        Scheduled Meds:  aspirin  81 mg Oral Daily   enoxaparin (LOVENOX) injection  40 mg Subcutaneous Q24H   famotidine  20 mg Oral BID   metoprolol tartrate  50 mg Oral BID   pravastatin  40 mg Oral q1800   senna-docusate  1 tablet Oral BID   Continuous Infusions:        Aline August, MD [facility] 01/22/2022, 8:04 AM

## 2022-01-22 NOTE — Progress Notes (Signed)
Mobility Specialist Progress Note:   01/22/22 1300  Mobility  Activity Ambulated with assistance in hallway  Level of Assistance Standby assist, set-up cues, supervision of patient - no hands on  Assistive Device None  Distance Ambulated (ft) 500 ft  Activity Response Tolerated well  Mobility Referral Yes  $Mobility charge 1 Mobility   Pt eager for mobility session. No physical assistance required. Pt left in chair with all needs met.   Nelta Numbers Acute Rehab Secure Chat or Office Phone: [phone]

## 2022-01-22 NOTE — Progress Notes (Signed)
Family requests update from MD and care management.  You may call her daughter Gentry Roch @ [phone].

## 2022-01-23 DIAGNOSIS — E871 Hypo-osmolality and hyponatremia: Secondary | ICD-10-CM | POA: Diagnosis not present

## 2022-01-23 LAB — BASIC METABOLIC PANEL
Anion gap: 13 (ref 5–15)
BUN: 15 mg/dL (ref 8–23)
CO2: 23 mmol/L (ref 22–32)
Calcium: 9.5 mg/dL (ref 8.9–10.3)
Chloride: 94 mmol/L — ABNORMAL LOW (ref 98–111)
Creatinine, Ser: 0.87 mg/dL (ref 0.44–1.00)
GFR, Estimated: >60 mL/min (ref >60)
Glucose, Bld: 145 mg/dL — ABNORMAL HIGH (ref 70–99)
Potassium: 3.2 mmol/L — ABNORMAL LOW (ref 3.5–5.1)
Sodium: 130 mmol/L — ABNORMAL LOW (ref 135–145)

## 2022-01-23 LAB — MAGNESIUM: Magnesium: 1.4 mg/dL — ABNORMAL LOW (ref 1.7–2.4)

## 2022-01-23 MED ORDER — HYDRALAZINE HCL 20 MG/ML IJ SOLN
5.0000 mg | Freq: Four times a day (QID) | INTRAMUSCULAR | Status: DC | PRN
  Administered 2022-01-23: 5 mg via INTRAVENOUS
  Filled 2022-01-23: qty 1

## 2022-01-23 MED ORDER — POTASSIUM CHLORIDE CRYS ER 20 MEQ PO TBCR
40.0000 meq | EXTENDED_RELEASE_TABLET | ORAL | Status: AC
  Administered 2022-01-23 (×2): 40 meq via ORAL
  Filled 2022-01-23 (×2): qty 2

## 2022-01-23 MED ORDER — FAMOTIDINE 20 MG PO TABS: 20.0000 mg | ORAL_TABLET | Freq: Two times a day (BID) | ORAL | 0 refills | Status: AC

## 2022-01-23 MED ORDER — POTASSIUM CHLORIDE CRYS ER 20 MEQ PO TBCR: 20.0000 meq | EXTENDED_RELEASE_TABLET | Freq: Two times a day (BID) | ORAL | 0 refills | Status: DC

## 2022-01-23 MED ORDER — MAGNESIUM SULFATE 2 GM/50ML IV SOLN: 2.0000 g | Freq: Once | INTRAVENOUS | Status: DC

## 2022-01-23 MED ORDER — MAGNESIUM OXIDE -MG SUPPLEMENT 400 (240 MG) MG PO TABS
800.0000 mg | ORAL_TABLET | Freq: Once | ORAL | Status: AC
  Administered 2022-01-23: 800 mg via ORAL
  Filled 2022-01-23: qty 2

## 2022-01-23 NOTE — Discharge Summary (Signed)
Physician Discharge Summary  Susan Marsh PFX:[id number] DOB: 09-07-1940 DOA: 01/18/2022  PCP: [name], MD  Admit date: 01/18/2022 Discharge date: 01/23/2022  Admitted From: Home Disposition: Home  Recommendations for Outpatient Follow-up:  Follow up with PCP in 1 week with repeat BMP/magnesium Recommend outpatient follow-up with psychiatry Recommend outpatient evaluation and follow-up by neurology as well Follow up in ED if symptoms worsen or new appear   Home Health: No Equipment/Devices: None  Discharge Condition: Stable CODE STATUS: Full Diet recommendation: Heart healthy  Brief/Interim Summary: 81 year old female with medical history of aortic stenosis, CAD, s/p stent, GERD, hyperlipidemia, depression, hypertension came to ED with erratic behavior, visual hallucinations and poor p.o. intake.  Patient was recently seen by psych today in the ED who recommended no need for inpatient psychiatric hospitalization and recommended outpatient psychiatry follow-up. On presentation to the ED this time, her sodium was 115.  She was started on IV fluids.  Nephrology was consulted.  She was treated with IV fluids.  Her condition gradually improved.  Psychiatry was also consulted and recommended no need for inpatient psychiatric hospitalization and recommended outpatient psychiatry follow-up.  Nephrology has already signed off after sodium level improved; IV fluids have been discontinued.  She has tolerated PT.  She will be discharged home today with outpatient follow-up with PCP/psychiatry.  Recommend outpatient neurology evaluation and follow-up as well.  Discharge Diagnoses:   Hyponatremia -Sodium 115 on presentation.  Nephrology followed the patient during the hospitalization and signed off on 01/22/2022 and sodium was 132.  Sodium 130 this morning.  Off IV fluids.  Encourage oral intake. -HCTZ has been discontinued.  Cymbalta on hold as well. -Discharge patient home today.   Outpatient follow-up of BMP within a week.   AKI Dehydration -Lisinopril/HCTZ on hold. -Treated with IV fluids.  Creatinine much improved today to 0.87.  Nephrology has already signed off and recommended that lisinopril can be resumed if needed.  This can be done as an outpatient.  HCTZ has been discontinued.  Acute metabolic encephalopathy Hallucinations -Presented with hallucinations and erratic behavior -Improving.  Psychiatry recommended no need for inpatient psychiatric hospitalization and recommended outpatient psychiatry follow-up.  -Tolerated PT. -Might benefit from outpatient neurology evaluation as well.   Cholelithiasis Abdominal pain -No signs of cholecystitis on ultrasound.  Continue famotidine.  Might need outpatient general surgery evaluation   Hypokalemia -Replace.  Continue replacement on discharge.  Hypomagnesemia -replace prior to discharge.  Leukocytosis -Resolved   Hypertension -Monitor blood pressure.  Continue metoprolol and amlodipine.  Hydrochlorothiazide and lisinopril on hold.   Hyperlipidemia -Continue statin   CAD s/p stent placement -Continue aspirin, statin, metoprolol   Discharge Instructions  Discharge Instructions     Diet general   Complete by: As directed    Increase activity slowly   Complete by: As directed       Allergies as of 01/23/2022       Reactions   Alendronate Sodium    Other reaction(s): GI upset   Other    Cortisporin otic Other reaction(s): GI upset        Medication List     STOP taking these medications    clonazePAM 0.5 MG tablet Commonly known as: KLONOPIN   DULoxetine 60 MG capsule Commonly known as: Cymbalta   hydrochlorothiazide 25 MG tablet Commonly known as: HYDRODIURIL   lisinopril 40 MG tablet Commonly known as: ZESTRIL   meloxicam 15 MG tablet Commonly known as: MOBIC   nitrofurantoin (macrocrystal-monohydrate) 100 MG capsule Commonly known  as: MACROBID   pantoprazole 40 MG  tablet Commonly known as: PROTONIX       TAKE these medications    amLODipine 10 MG tablet Commonly known as: NORVASC Take 10 mg by mouth daily.   aspirin 81 MG chewable tablet Chew 81 mg by mouth daily.   famotidine 20 MG tablet Commonly known as: PEPCID Take 1 tablet (20 mg total) by mouth 2 (two) times daily.   metoprolol tartrate 50 MG tablet Commonly known as: LOPRESSOR Take 50 mg by mouth 2 (two) times daily.   ondansetron 4 MG disintegrating tablet Commonly known as: ZOFRAN-ODT Take 4 mg by mouth 3 (three) times daily.   potassium chloride SA 20 MEQ tablet Commonly known as: KLOR-CON M Take 1 tablet (20 mEq total) by mouth 2 (two) times daily.   pravastatin 40 MG tablet Commonly known as: PRAVACHOL Take 40 mg by mouth daily.   ZTlido 1.8 % Ptch Generic drug: Lidocaine Apply 1.8 patches topically as needed. What changed: reasons to take this        Follow-up Information     [name], MD. Schedule an appointment as soon as possible for a visit in 1 week(s).   Specialty: Family Medicine Why: with repeat bmp/magnesium Contact information: [address] [place] [address] [phone]                Allergies  Allergen Reactions   Alendronate Sodium     Other reaction(s): GI upset   Other     Cortisporin otic Other reaction(s): GI upset    Consultations: Nephrology/psychiatry   Procedures/Studies: DG CHEST PORT 1 VIEW  Result Date: 01/20/2022 CLINICAL DATA:  Chest pain EXAM: PORTABLE CHEST 1 VIEW COMPARISON:  Chest x-ray ribs 08/23/2013.  Chest x-ray 02/10/2015. FINDINGS: There is a linear band of atelectasis or scarring in the right upper lobe. There is no focal lung infiltrate, pleural effusion or pneumothorax. The cardiomediastinal silhouette is within normal limits. No acute fractures are seen. IMPRESSION: 1.   No acute cardiopulmonary process. Electronically Signed   By: Darliss Cheney M.D.   On: 01/20/2022  23:09   US Abdomen Limited RUQ (LIVER/GB)  Result Date: 01/20/2022 CLINICAL DATA:  Gallbladder stones EXAM: ULTRASOUND ABDOMEN LIMITED RIGHT UPPER QUADRANT COMPARISON:  10/13/2017 FINDINGS: Gallbladder: Sludge and stones are seen in the lumen of gallbladder. Gallbladder is contracted. Technologist did not observe any tenderness over the gallbladder. There is no fluid around the gallbladder. Common bile duct: Diameter: 3.2 mm Liver: There is increased echogenicity in liver. No focal abnormalities are seen in visualized portions of liver. Portal vein is patent on color Doppler imaging with normal direction of blood flow towards the liver. Other: None. IMPRESSION: Sludge and stones are seen in the lumen of gallbladder. There are no imaging signs of acute cholecystitis. There is no dilation of bile ducts. Fatty liver. Electronically Signed   By: Ernie Avena M.D.   On: 01/20/2022 11:14      Subjective: Patient seen and examined at bedside.  Awake but still slow to respond and a poor historian.  No fever, agitation or vomiting reported.  Spoke to daughter on phone today.  Discharge Exam: Vitals:   01/23/22 0434 01/23/22 0852  BP: (!) 180/90 (!) 153/83  Pulse: 77 91  Resp: 20 18  Temp: 98.3 F (36.8 C) 97.6 F (36.4 C)  SpO2: 98% 98%    General: Pt is alert, awake, not in acute distress.  On room air.  Looks chronically ill and deconditioned.  Slow to respond.  Poor historian. Cardiovascular: rate controlled, S1/S2 + Respiratory: bilateral decreased breath sounds at bases Abdominal: Soft, NT, ND, bowel sounds + Extremities: Trace lower extremity edema; no cyanosis    The results of significant diagnostics from this hospitalization (including imaging, microbiology, ancillary and laboratory) are listed below for reference.     Microbiology: No results found for this or any previous visit (from the past 240 hour(s)).   Labs: BNP (last 3 results) No results for input(s): "BNP" in  the last 8760 hours. Basic Metabolic Panel: Recent Labs  Lab 01/18/22 2020 01/19/22 0314 01/19/22 0322 01/20/22 1157 01/20/22 1921 01/21/22 0921 01/22/22 0514 01/23/22 0823  NA 117* 118*   < > 129* 129* 128* 132* 130*  K 2.4* 3.2*   < > 3.6 3.5 3.3* 4.2 3.2*  CL 70* 73*   < > 95* 87* 88* 91* 94*  CO2 26 25   < > 22 27 26 26 23   GLUCOSE 121* 116*   < > 94 114* 133* 111* 145*  BUN 50* 48*   < > 34* 36* 25* 22 15  CREATININE 2.73* 2.27*   < > 1.34* 1.43* 1.21* 1.04* 0.87  CALCIUM 9.9 9.2   < > 8.2* 9.5 10.0 9.7 9.5  MG 1.4* 1.9  --   --   --  1.8 1.7 1.4*  PHOS  --   --   --   --  1.7*  --   --   --    < > = values in this interval not displayed.   Liver Function Tests: Recent Labs  Lab 01/18/22 1840 01/20/22 1921 01/21/22 0921  AST 45*  --  41  ALT 28  --  27  ALKPHOS 66  --  62  BILITOT 1.0  --  0.9  PROT 8.2*  --  7.4  ALBUMIN 4.2 3.5 3.7   No results for input(s): "LIPASE", "AMYLASE" in the last 168 hours. No results for input(s): "AMMONIA" in the last 168 hours. CBC: Recent Labs  Lab 01/18/22 1840 01/19/22 0133 01/19/22 0322 01/21/22 0921  WBC 11.3* 12.2*  --  5.0  NEUTROABS  --  6.7  --  2.7  HGB 16.1* 17.0* 14.3 14.5  HCT 44.1 45.3 42.0 39.6  MCV 82.1 81.3  --  83.9  PLT 295 232  --  253   Cardiac Enzymes: No results for input(s): "CKTOTAL", "CKMB", "CKMBINDEX", "TROPONINI" in the last 168 hours. BNP: Invalid input(s): "POCBNP" CBG: No results for input(s): "GLUCAP" in the last 168 hours. D-Dimer No results for input(s): "DDIMER" in the last 72 hours. Hgb A1c No results for input(s): "HGBA1C" in the last 72 hours. Lipid Profile No results for input(s): "CHOL", "HDL", "LDLCALC", "TRIG", "CHOLHDL", "LDLDIRECT" in the last 72 hours. Thyroid function studies No results for input(s): "TSH", "T4TOTAL", "T3FREE", "THYROIDAB" in the last 72 hours.  Invalid input(s): "FREET3" Anemia work up No results for input(s): "VITAMINB12", "FOLATE", "FERRITIN",  "TIBC", "IRON", "RETICCTPCT" in the last 72 hours. Urinalysis    Component Value Date/Time   COLORURINE YELLOW 01/19/2022 0802   APPEARANCEUR HAZY (A) 01/19/2022 0802   LABSPEC 1.008 01/19/2022 0802   PHURINE 5.0 01/19/2022 0802   GLUCOSEU NEGATIVE 01/19/2022 0802   HGBUR NEGATIVE 01/19/2022 0802   BILIRUBINUR NEGATIVE 01/19/2022 0802   KETONESUR NEGATIVE 01/19/2022 0802   PROTEINUR NEGATIVE 01/19/2022 0802   NITRITE NEGATIVE 01/19/2022 0802   LEUKOCYTESUR MODERATE (A) 01/19/2022 0802  Sepsis Labs Recent Labs  Lab 01/18/22 1840 01/19/22 0133 01/21/22 0921  WBC 11.3* 12.2* 5.0   Microbiology No results found for this or any previous visit (from the past 240 hour(s)).   Time coordinating discharge: 35 minutes  SIGNED:   Glade Lloyd, MD  [facility] 01/23/2022, 9:49 AM

## 2022-01-23 NOTE — TOC Transition Note (Signed)
Transition of Care [facility]) - CM/SW Discharge Note   Patient Details  Name: Susan Marsh MRN: [id number] Date of Birth: 08/21/1940  [facility] CM/SW Contact:  Tom-Johnson, Renea Ee, RN Phone Number: 01/23/2022, 11:30 AM   Clinical Narrative:     Patient is scheduled for discharge today. Outpatient f/u info on AVS. NO TOC needs or recommendations noted. Family to transport at discharge. No further TOC needs noted.    Final next level of care: Home/Self Care Barriers to Discharge: Barriers Resolved   Patient Goals and CMS Choice Patient states their goals for this hospitalization and ongoing recovery are:: To return home CMS Medicare.gov Compare Post Acute Care list provided to:: Patient Choice offered to / list presented to : NA  Discharge Placement                Patient to be transferred to facility by: Family      Discharge Plan and Services                DME Arranged: N/A DME Agency: NA       HH Arranged: NA HH Agency: NA        Social Determinants of Health (SDOH) Interventions     Readmission Risk Interventions     No data to display

## 2022-01-23 NOTE — Progress Notes (Signed)
Mobility Specialist Progress Note:   01/23/22 1125  Mobility  Activity Ambulated with assistance in hallway;Ambulated independently in hallway  Level of Assistance Modified independent, requires aide device or extra time  Assistive Device None  Distance Ambulated (ft) 500 ft  Activity Response Tolerated well  Mobility Referral Yes  $Mobility charge 1 Mobility   Pt received ambulating in hallway at a modI level (incr time). Asx throughout. Back in room with all needs met, eager for d/c.  Nelta Numbers Acute Rehab Secure Chat or Office Phone: [phone]

## 2022-01-23 NOTE — Plan of Care (Signed)

## 2022-01-29 DIAGNOSIS — E7800 Pure hypercholesterolemia, unspecified: Secondary | ICD-10-CM | POA: Diagnosis not present

## 2022-01-29 DIAGNOSIS — E871 Hypo-osmolality and hyponatremia: Secondary | ICD-10-CM | POA: Diagnosis not present

## 2022-01-29 DIAGNOSIS — R300 Dysuria: Secondary | ICD-10-CM | POA: Diagnosis not present

## 2022-01-29 DIAGNOSIS — K219 Gastro-esophageal reflux disease without esophagitis: Secondary | ICD-10-CM | POA: Diagnosis not present

## 2022-01-29 DIAGNOSIS — I10 Essential (primary) hypertension: Secondary | ICD-10-CM | POA: Diagnosis not present

## 2022-01-29 DIAGNOSIS — F419 Anxiety disorder, unspecified: Secondary | ICD-10-CM | POA: Diagnosis not present

## 2022-02-24 DIAGNOSIS — M25511 Pain in right shoulder: Secondary | ICD-10-CM | POA: Diagnosis not present

## 2022-03-02 ENCOUNTER — Emergency Department (HOSPITAL_BASED_OUTPATIENT_CLINIC_OR_DEPARTMENT_OTHER): Payer: Medicare HMO

## 2022-03-02 ENCOUNTER — Inpatient Hospital Stay (HOSPITAL_COMMUNITY): Payer: Medicare HMO

## 2022-03-02 ENCOUNTER — Inpatient Hospital Stay (HOSPITAL_BASED_OUTPATIENT_CLINIC_OR_DEPARTMENT_OTHER)
Admission: EM | Admit: 2022-03-02 | Discharge: 2022-03-06 | DRG: 041 | Disposition: A | Discharge status: Skilled Nursing Facility | Payer: Medicare HMO | Attending: Internal Medicine | Admitting: Internal Medicine

## 2022-03-02 ENCOUNTER — Encounter (HOSPITAL_COMMUNITY): Payer: Self-pay

## 2022-03-02 ENCOUNTER — Other Ambulatory Visit: Payer: Self-pay

## 2022-03-02 ENCOUNTER — Encounter (HOSPITAL_BASED_OUTPATIENT_CLINIC_OR_DEPARTMENT_OTHER): Payer: Self-pay

## 2022-03-02 DIAGNOSIS — Z79899 Other long term (current) drug therapy: Secondary | ICD-10-CM

## 2022-03-02 DIAGNOSIS — R471 Dysarthria and anarthria: Secondary | ICD-10-CM | POA: Diagnosis present

## 2022-03-02 DIAGNOSIS — N390 Urinary tract infection, site not specified: Secondary | ICD-10-CM | POA: Diagnosis present

## 2022-03-02 DIAGNOSIS — G8194 Hemiplegia, unspecified affecting left nondominant side: Secondary | ICD-10-CM | POA: Diagnosis not present

## 2022-03-02 DIAGNOSIS — I10 Essential (primary) hypertension: Secondary | ICD-10-CM | POA: Diagnosis not present

## 2022-03-02 DIAGNOSIS — I2510 Atherosclerotic heart disease of native coronary artery without angina pectoris: Secondary | ICD-10-CM | POA: Diagnosis present

## 2022-03-02 DIAGNOSIS — R1312 Dysphagia, oropharyngeal phase: Secondary | ICD-10-CM | POA: Diagnosis not present

## 2022-03-02 DIAGNOSIS — E872 Acidosis, unspecified: Secondary | ICD-10-CM | POA: Diagnosis not present

## 2022-03-02 DIAGNOSIS — R29710 NIHSS score 10: Secondary | ICD-10-CM | POA: Diagnosis present

## 2022-03-02 DIAGNOSIS — R488 Other symbolic dysfunctions: Secondary | ICD-10-CM | POA: Diagnosis present

## 2022-03-02 DIAGNOSIS — Z955 Presence of coronary angioplasty implant and graft: Secondary | ICD-10-CM

## 2022-03-02 DIAGNOSIS — R8281 Pyuria: Secondary | ICD-10-CM | POA: Diagnosis present

## 2022-03-02 DIAGNOSIS — R531 Weakness: Secondary | ICD-10-CM | POA: Diagnosis not present

## 2022-03-02 DIAGNOSIS — I352 Nonrheumatic aortic (valve) stenosis with insufficiency: Secondary | ICD-10-CM | POA: Diagnosis present

## 2022-03-02 DIAGNOSIS — I6389 Other cerebral infarction: Secondary | ICD-10-CM | POA: Diagnosis not present

## 2022-03-02 DIAGNOSIS — R41841 Cognitive communication deficit: Secondary | ICD-10-CM | POA: Diagnosis not present

## 2022-03-02 DIAGNOSIS — E7800 Pure hypercholesterolemia, unspecified: Secondary | ICD-10-CM | POA: Diagnosis present

## 2022-03-02 DIAGNOSIS — Z8249 Family history of ischemic heart disease and other diseases of the circulatory system: Secondary | ICD-10-CM | POA: Diagnosis not present

## 2022-03-02 DIAGNOSIS — Z7401 Bed confinement status: Secondary | ICD-10-CM | POA: Diagnosis not present

## 2022-03-02 DIAGNOSIS — I6349 Cerebral infarction due to embolism of other cerebral artery: Principal | ICD-10-CM | POA: Diagnosis present

## 2022-03-02 DIAGNOSIS — I639 Cerebral infarction, unspecified: Secondary | ICD-10-CM | POA: Diagnosis present

## 2022-03-02 DIAGNOSIS — H9190 Unspecified hearing loss, unspecified ear: Secondary | ICD-10-CM | POA: Diagnosis present

## 2022-03-02 DIAGNOSIS — E876 Hypokalemia: Secondary | ICD-10-CM | POA: Diagnosis not present

## 2022-03-02 DIAGNOSIS — I6523 Occlusion and stenosis of bilateral carotid arteries: Secondary | ICD-10-CM | POA: Diagnosis not present

## 2022-03-02 DIAGNOSIS — R414 Neurologic neglect syndrome: Secondary | ICD-10-CM | POA: Diagnosis present

## 2022-03-02 DIAGNOSIS — Z888 Allergy status to other drugs, medicaments and biological substances status: Secondary | ICD-10-CM | POA: Diagnosis not present

## 2022-03-02 DIAGNOSIS — R279 Unspecified lack of coordination: Secondary | ICD-10-CM | POA: Diagnosis not present

## 2022-03-02 DIAGNOSIS — F0390 Unspecified dementia without behavioral disturbance: Secondary | ICD-10-CM | POA: Diagnosis present

## 2022-03-02 DIAGNOSIS — R29810 Facial weakness: Secondary | ICD-10-CM | POA: Diagnosis present

## 2022-03-02 DIAGNOSIS — R29818 Other symptoms and signs involving the nervous system: Secondary | ICD-10-CM | POA: Diagnosis not present

## 2022-03-02 DIAGNOSIS — I471 Supraventricular tachycardia, unspecified: Secondary | ICD-10-CM | POA: Diagnosis not present

## 2022-03-02 DIAGNOSIS — Z8673 Personal history of transient ischemic attack (TIA), and cerebral infarction without residual deficits: Secondary | ICD-10-CM | POA: Diagnosis present

## 2022-03-02 DIAGNOSIS — K219 Gastro-esophageal reflux disease without esophagitis: Secondary | ICD-10-CM | POA: Diagnosis present

## 2022-03-02 DIAGNOSIS — M6281 Muscle weakness (generalized): Secondary | ICD-10-CM | POA: Diagnosis not present

## 2022-03-02 LAB — RAPID URINE DRUG SCREEN, HOSP PERFORMED
Amphetamines: NOT DETECTED
Barbiturates: NOT DETECTED
Benzodiazepines: NOT DETECTED
Cocaine: NOT DETECTED
Opiates: NOT DETECTED
Tetrahydrocannabinol: NOT DETECTED

## 2022-03-02 LAB — COMPREHENSIVE METABOLIC PANEL
ALT: 28 U/L (ref 0–44)
AST: 20 U/L (ref 15–41)
Albumin: 5.2 g/dL — ABNORMAL HIGH (ref 3.5–5.0)
Alkaline Phosphatase: 67 U/L (ref 38–126)
Anion gap: 14 (ref 5–15)
BUN: 25 mg/dL — ABNORMAL HIGH (ref 8–23)
CO2: 23 mmol/L (ref 22–32)
Calcium: 10.5 mg/dL — ABNORMAL HIGH (ref 8.9–10.3)
Chloride: 105 mmol/L (ref 98–111)
Creatinine, Ser: 0.82 mg/dL (ref 0.44–1.00)
GFR, Estimated: >60 mL/min (ref >60)
Glucose, Bld: 125 mg/dL — ABNORMAL HIGH (ref 70–99)
Potassium: 2.7 mmol/L — CL (ref 3.5–5.1)
Sodium: 142 mmol/L (ref 135–145)
Total Bilirubin: 1.1 mg/dL (ref 0.3–1.2)
Total Protein: 9.4 g/dL — ABNORMAL HIGH (ref 6.5–8.1)

## 2022-03-02 LAB — CBC
HCT: 46.5 % — ABNORMAL HIGH (ref 36.0–46.0)
Hemoglobin: 16.1 g/dL — ABNORMAL HIGH (ref 12.0–15.0)
MCH: 30 pg (ref 26.0–34.0)
MCHC: 34.6 g/dL (ref 30.0–36.0)
MCV: 86.8 fL (ref 80.0–100.0)
Platelets: 305 10*3/uL (ref 150–400)
RBC: 5.36 MIL/uL — ABNORMAL HIGH (ref 3.87–5.11)
RDW: 14.5 % (ref 11.5–15.5)
WBC: 10.7 10*3/uL — ABNORMAL HIGH (ref 4.0–10.5)
nRBC: 0 % (ref 0.0–0.2)

## 2022-03-02 LAB — ETHANOL: Alcohol, Ethyl (B): <10 mg/dL (ref <10)

## 2022-03-02 LAB — URINALYSIS, ROUTINE W REFLEX MICROSCOPIC
Bilirubin Urine: NEGATIVE
Glucose, UA: NEGATIVE mg/dL
Hgb urine dipstick: NEGATIVE
Ketones, ur: NEGATIVE mg/dL
Nitrite: NEGATIVE
Specific Gravity, Urine: 1.034 — ABNORMAL HIGH (ref 1.005–1.030)
pH: 7 (ref 5.0–8.0)

## 2022-03-02 LAB — DIFFERENTIAL
Abs Immature Granulocytes: 0.04 10*3/uL (ref 0.00–0.07)
Basophils Absolute: 0 10*3/uL (ref 0.0–0.1)
Basophils Relative: 0 %
Eosinophils Absolute: 0 10*3/uL (ref 0.0–0.5)
Eosinophils Relative: 0 %
Immature Granulocytes: 0 %
Lymphocytes Relative: 18 %
Lymphs Abs: 1.9 10*3/uL (ref 0.7–4.0)
Monocytes Absolute: 1 10*3/uL (ref 0.1–1.0)
Monocytes Relative: 9 %
Neutro Abs: 7.8 10*3/uL — ABNORMAL HIGH (ref 1.7–7.7)
Neutrophils Relative %: 73 %

## 2022-03-02 LAB — PROTIME-INR
INR: 1 (ref 0.8–1.2)
Prothrombin Time: 13.5 seconds (ref 11.4–15.2)

## 2022-03-02 LAB — CBG MONITORING, ED: Glucose-Capillary: 101 mg/dL — ABNORMAL HIGH (ref 70–99)

## 2022-03-02 LAB — APTT: aPTT: 29 seconds (ref 24–36)

## 2022-03-02 LAB — MAGNESIUM: Magnesium: 1.9 mg/dL (ref 1.7–2.4)

## 2022-03-02 MED ORDER — ASPIRIN 81 MG PO TBEC
81.0000 mg | DELAYED_RELEASE_TABLET | Freq: Every day | ORAL | Status: DC
  Administered 2022-03-03 – 2022-03-06 (×4): 81 mg via ORAL
  Filled 2022-03-02 (×4): qty 1

## 2022-03-02 MED ORDER — SODIUM CHLORIDE 0.9 % IV BOLUS
1000.0000 mL | Freq: Once | INTRAVENOUS | Status: AC
  Administered 2022-03-02: 1000 mL via INTRAVENOUS

## 2022-03-02 MED ORDER — ACETAMINOPHEN 325 MG PO TABS
650.0000 mg | ORAL_TABLET | ORAL | Status: DC | PRN
  Administered 2022-03-03: 650 mg via ORAL
  Filled 2022-03-02: qty 2

## 2022-03-02 MED ORDER — STROKE: EARLY STAGES OF RECOVERY BOOK
Freq: Once | Status: AC
  Filled 2022-03-02: qty 1

## 2022-03-02 MED ORDER — CLOPIDOGREL BISULFATE 75 MG PO TABS
75.0000 mg | ORAL_TABLET | Freq: Every day | ORAL | Status: DC
  Administered 2022-03-03 – 2022-03-06 (×4): 75 mg via ORAL
  Filled 2022-03-02 (×4): qty 1

## 2022-03-02 MED ORDER — ENOXAPARIN SODIUM 40 MG/0.4ML IJ SOSY
40.0000 mg | PREFILLED_SYRINGE | INTRAMUSCULAR | Status: DC
  Administered 2022-03-03 – 2022-03-06 (×4): 40 mg via SUBCUTANEOUS
  Filled 2022-03-02 (×4): qty 0.4

## 2022-03-02 MED ORDER — ACETAMINOPHEN 160 MG/5ML PO SOLN: 650.0000 mg | ORAL | Status: DC | PRN

## 2022-03-02 MED ORDER — POTASSIUM CHLORIDE 10 MEQ/100ML IV SOLN
10.0000 meq | Freq: Once | INTRAVENOUS | Status: AC
  Administered 2022-03-02: 10 meq via INTRAVENOUS
  Filled 2022-03-02: qty 100

## 2022-03-02 MED ORDER — ACETAMINOPHEN 650 MG RE SUPP: 650.0000 mg | RECTAL | Status: DC | PRN

## 2022-03-02 MED ORDER — POTASSIUM CHLORIDE 10 MEQ/100ML IV SOLN
10.0000 meq | INTRAVENOUS | Status: AC
  Administered 2022-03-02 – 2022-03-03 (×3): 10 meq via INTRAVENOUS
  Filled 2022-03-02 (×3): qty 100

## 2022-03-02 MED ORDER — ASPIRIN 81 MG PO CHEW
81.0000 mg | CHEWABLE_TABLET | Freq: Once | ORAL | Status: AC
  Administered 2022-03-02: 81 mg via ORAL
  Filled 2022-03-02: qty 1

## 2022-03-02 MED ORDER — IOHEXOL 350 MG/ML SOLN
100.0000 mL | Freq: Once | INTRAVENOUS | Status: AC | PRN
  Administered 2022-03-02: 100 mL via INTRAVENOUS

## 2022-03-02 MED ORDER — CLOPIDOGREL BISULFATE 75 MG PO TABS
75.0000 mg | ORAL_TABLET | Freq: Once | ORAL | Status: AC
  Administered 2022-03-02: 75 mg via ORAL
  Filled 2022-03-02: qty 1

## 2022-03-02 MED ORDER — ASPIRIN 300 MG RE SUPP: 300.0000 mg | Freq: Once | RECTAL | Status: AC

## 2022-03-02 NOTE — Assessment & Plan Note (Signed)
No SIRS Getting UCx, hold off on ABx for the moment, todays presenting symptoms more likely due to acute ischemic stroke.

## 2022-03-02 NOTE — ED Triage Notes (Signed)
Pt fell yesterday in the am and later that afternoon. States she is not on blood thinners. Pt's family states her Loc and mentation has been off. Pt's family states patients left leg and left arm is dangling. Fall was not witnessed.

## 2022-03-02 NOTE — Assessment & Plan Note (Signed)
Hold home BP meds and allow permissive HTN in setting of likely acute ischemic stroke.

## 2022-03-02 NOTE — H&P (Signed)
History and Physical    Patient: Susan Marsh:[id number] DOB: [birth year] DOA: 03/02/2022 DOS: the patient was seen and examined on 03/02/2022 PCP: Susan Blamer, MD  Patient coming from: Home  Chief Complaint:  Chief Complaint  Patient presents with   Fall   Code Stroke   HPI: ASHEY Marsh is a 81 y.o. female with medical history significant of HTN, HLD, cad S/P stent, aortic stenosis.  Pt last admitted from 10/29-11/3 with erratic behavior, visual hallucinations, and poor PO intake that was thought to be related to AKI, dehydration, and hyponatremia.  At baseline pt fully functional, lives independently, and does all ADLs.  Today she presents to ED with L sided weakness, L hemineglect.  Impaired naming.  LKW 1400 yesterday.  Family noticed arm weakness after that, worsened today until they brought her in.   Review of Systems: As mentioned in the history of present illness. All other systems reviewed and are negative. Past Medical History:  Diagnosis Date   Aortic stenosis    ASCVD (arteriosclerotic cardiovascular disease)    s/p stent   GERD (gastroesophageal reflux disease)    Hearing loss    R>L   High cholesterol    Hypertension    Osteoarthritis    Past Surgical History:  Procedure Laterality Date   BLADDER SURGERY     CARDIAC SURGERY     Social History:  reports that she has never smoked. She does not have any smokeless tobacco history on file. She reports that she does not drink alcohol and does not use drugs.  Allergies  Allergen Reactions   Alendronate Sodium     Other reaction(s): GI upset   Other     Cortisporin otic Other reaction(s): GI upset    Family History  Problem Relation Age of Onset   Other Mother        unsure of history   Other Father        unsure of history   Heart disease Brother     Prior to Admission medications   Medication Sig Start Date End Date Taking? Authorizing Provider  acetaminophen (TYLENOL) 325 MG  tablet Take 650 mg by mouth every 6 (six) hours as needed for headache.   Yes [provider]  amLODipine (NORVASC) 10 MG tablet Take 10 mg by mouth daily. 02/18/21  Yes [provider]  busPIRone (BUSPAR) 10 MG tablet Take 5-10 mg by mouth 2 (two) times daily. 02/26/22  Yes [provider]  famotidine (PEPCID) 20 MG tablet Take 1 tablet (20 mg total) by mouth 2 (two) times daily. 01/23/22  Yes Susan Lloyd, MD  losartan (COZAAR) 50 MG tablet Take 50 mg by mouth daily. 01/29/22  Yes [provider]  metoprolol (LOPRESSOR) 50 MG tablet Take 50 mg by mouth 2 (two) times daily. 02/10/15  Yes [provider]  ondansetron (ZOFRAN-ODT) 4 MG disintegrating tablet Take 4 mg by mouth 3 (three) times daily. 01/17/22  Yes [provider]  pravastatin (PRAVACHOL) 40 MG tablet Take 40 mg by mouth daily. 02/10/15  Yes [provider]  Lidocaine (ZTLIDO) 1.8 % PTCH Apply 1.8 patches topically as needed. Patient not taking: Reported on 03/02/2022 01/13/22   Susan Norfolk, MD  potassium chloride SA (KLOR-CON M) 20 MEQ tablet Take 1 tablet (20 mEq total) by mouth 2 (two) times daily. Patient not taking: Reported on 03/02/2022 01/23/22   Susan Lloyd, MD    Physical Exam: Vitals:   03/02/22 1600 03/02/22 1700 03/02/22  1800 03/02/22 1940  BP: (!) 153/124 (!) 165/80 (!) 148/79 (!) 154/96  Pulse: 100 94 96 (!) 118  Resp: 19 17 16 18   Temp: 98.2 F (36.8 C)   97.8 F (36.6 C)  TempSrc:    Oral  SpO2: 98% 97% 98% 98%  Weight:      Height:       Constitutional: NAD, calm, comfortable Eyes: PERRL, lids and conjunctivae normal ENMT: Mucous membranes are moist. Posterior pharynx clear of any exudate or lesions.Normal dentition. Hard of hearing Neck: normal, supple, no masses, no thyromegaly Respiratory: clear to auscultation bilaterally, no wheezing, no crackles. Normal respiratory effort. No accessory muscle use.  Cardiovascular: Regular rate and  rhythm, no murmurs / rubs / gallops. No extremity edema. 2+ pedal pulses. No carotid bruits.  Abdomen: no tenderness, no masses palpated. No hepatosplenomegaly. Bowel sounds positive.  Musculoskeletal: no clubbing / cyanosis. No joint deformity upper and lower extremities. Good ROM, no contractures. Normal muscle tone.  Skin: no rashes, lesions, ulcers. No induration Neurologic: Mild dysarthria, L facial droop, LUE minimal movement, LLE 2/5, L hemineglect Psychiatric: Normal judgment and insight. Alert and oriented x 3. Normal mood.   Data Reviewed:    CT head = possible acute infarct R caudate  CTA = no intervenable lesion  CTP did not show any core or ischemia but is felt to have not picked up the R caudate ischemia which localizes to her sx.   Assessment and Plan: * Acute ischemic stroke (HCC) Pt with L sided weakness / hemi neglect and some speech difficulties. CT head = age indeterminate R caudate stroke CTP = no abnormalities, but not felt to have picked up the R caudate on study Stroke pathway Tele monitor MRI brain Neuro consulted ASA 81 daily Plavix 75 for 21 days PT/OT/SLP 2d echo A1C, FLP  Hypokalemia Replace K  Pyuria No SIRS Getting UCx, hold off on ABx for the moment, todays presenting symptoms more likely due to acute ischemic stroke.  HTN (hypertension) Hold home BP meds and allow permissive HTN in setting of likely acute ischemic stroke.      Advance Care Planning:   Code Status: Full Code  Consults: Neuro  Family Communication: No family in room  Severity of Illness: The appropriate patient status for this patient is INPATIENT. Inpatient status is judged to be reasonable and necessary in order to provide the required intensity of service to ensure the patient's safety. The patient's presenting symptoms, physical exam findings, and initial radiographic and laboratory data in the context of their chronic comorbidities is felt to place them at high  risk for further clinical deterioration. Furthermore, it is not anticipated that the patient will be medically stable for discharge from the hospital within 2 midnights of admission.   * I certify that at the point of admission it is my clinical judgment that the patient will require inpatient hospital care spanning beyond 2 midnights from the point of admission due to high intensity of service, high risk for further deterioration and high frequency of surveillance required.*  Author: ., DO 03/02/2022 8:56 PM  For on call review [email or web address].

## 2022-03-02 NOTE — Progress Notes (Signed)
Code stroke activated per elert @ 1303.  Pt in ER having been seen by ERP after coming in per POV.  LKWT reported as 1600 yesterday, left side flaccidity and some aphasia noted.  Paged neuro @ 1305, Dr Selina Cooley on camera to assess patient in CT @ 1312.  Pt in CT from 2863-8177, requiring IV start x 2 for appropriate IV for perfusion scan.  Dr Selina Cooley on camera in pt room after CT to discuss POC and CT results with family at bedside.  TSRN off camera @ 1406.  MRS 1  Social worker

## 2022-03-02 NOTE — ED Provider Notes (Signed)
[facility] EMERGENCY DEPT Provider Note   CSN: [id number] Arrival date & time: 03/02/22  1237     History  Chief Complaint  Patient presents with   Susan Marsh is a 81 y.o. female.  81 yo F with a chief complaints of left-sided weakness.  The patient actually had 2 falls yesterday.  She fell in the morning and was trapped in the bathroom for a little while and then fell in the afternoon.  The son was able to get her up and walk her back to the bed both times.  This morning when he got her up to take her to the bathroom he realized that her left side seemed a bit more weak than before.  Was unable to get her up this afternoon and realized that she was moving the left side of her body.  Then brought her here for evaluation.  Patient has some dementia at baseline but has been a bit more off.  She tells that she has some low back pain after the fall.   Fall       Home Medications Prior to Admission medications   Medication Sig Start Date End Date Taking? Authorizing Provider  amLODipine (NORVASC) 10 MG tablet Take 10 mg by mouth daily. 02/18/21   [provider]  aspirin 81 MG chewable tablet Chew 81 mg by mouth daily.    [provider]  famotidine (PEPCID) 20 MG tablet Take 1 tablet (20 mg total) by mouth 2 (two) times daily. 01/23/22   Glade Lloyd, MD  Lidocaine (ZTLIDO) 1.8 % PTCH Apply 1.8 patches topically as needed. Patient taking differently: Apply 1.8 patches topically as needed (for leg pain). 01/13/22   Windell Norfolk, MD  metoprolol (LOPRESSOR) 50 MG tablet Take 50 mg by mouth 2 (two) times daily. 02/10/15   [provider]  ondansetron (ZOFRAN-ODT) 4 MG disintegrating tablet Take 4 mg by mouth 3 (three) times daily. 01/17/22   [provider]  potassium chloride SA (KLOR-CON M) 20 MEQ tablet Take 1 tablet (20 mEq total) by mouth 2 (two) times daily. 01/23/22   Glade Lloyd, MD  pravastatin (PRAVACHOL)  40 MG tablet Take 40 mg by mouth daily. 02/10/15   [provider]      Allergies    Alendronate sodium and Other    Review of Systems   Review of Systems  Physical Exam Updated Vital Signs Ht 5\' 3"  (1.6 m)   Wt 62.3 kg   BMI 24.33 kg/m  Physical Exam Vitals and nursing note reviewed.  Constitutional:      General: She is not in acute distress.    Appearance: She is well-developed. She is not diaphoretic.  HENT:     Head: Normocephalic and atraumatic.  Eyes:     Pupils: Pupils are equal, round, and reactive to light.  Cardiovascular:     Rate and Rhythm: Normal rate and regular rhythm.     Heart sounds: No murmur heard.    No friction rub. No gallop.  Pulmonary:     Effort: Pulmonary effort is normal.     Breath sounds: No wheezing or rales.  Abdominal:     General: There is no distension.     Palpations: Abdomen is soft.     Tenderness: There is no abdominal tenderness.  Musculoskeletal:        General: No tenderness.     Cervical back: Normal range of motion and neck supple.  Skin:  General: Skin is warm and dry.  Neurological:     Mental Status: She is alert.     Comments: Confused response.  Left-sided neglect with left-sided paralysis.  Psychiatric:        Behavior: Behavior normal.     ED Results / Procedures / Treatments   Labs (all labs ordered are listed, but only abnormal results are displayed) Labs Reviewed  BASIC METABOLIC PANEL  ETHANOL  PROTIME-INR  APTT  CBC  DIFFERENTIAL  COMPREHENSIVE METABOLIC PANEL  RAPID URINE DRUG SCREEN, HOSP PERFORMED  URINALYSIS, ROUTINE W REFLEX MICROSCOPIC    EKG None  Radiology No results found.  Procedures Procedures    Medications Ordered in ED Medications - No data to display  ED Course/ Medical Decision Making/ A&P Clinical Course as of 03/02/22 1513  Mon Mar 02, 2022  1506 New weakness of left side admitted to main campus. [CC]    Clinical Course User Index [CC] Glyn Ade, MD                           Medical Decision Making Amount and/or Complexity of Data Reviewed Labs: ordered. Radiology: ordered.  Risk Prescription drug management. Decision regarding hospitalization.   81 yo F with a chief complaints of multiple falls yesterday confusion and then today was noted not to be moving the left side of her body.  Patient's last known normal was about 4 PM yesterday afternoon.  She is LVO positive for me.  Seen urgently by neurology.  Thought not to benefit from thrombolytics or intervention.  Recommended hospitalist admission.  Hypokalemia 2.7.  Will supplement.  The patients results and plan were reviewed and discussed.   Any x-rays performed were independently reviewed by myself.   Differential diagnosis were considered with the presenting HPI.  Medications  potassium chloride 10 mEq in 100 mL IVPB (10 mEq Intravenous New Bag/Given 03/02/22 1430)  iohexol (OMNIPAQUE) 350 MG/ML injection 100 mL (100 mLs Intravenous Contrast Given 03/02/22 1333)  sodium chloride 0.9 % bolus 1,000 mL (1,000 mLs Intravenous New Bag/Given 03/02/22 1429)  aspirin chewable tablet 81 mg (81 mg Oral Given 03/02/22 1436)    Or  aspirin suppository 300 mg ( Rectal See Alternative 03/02/22 1436)  clopidogrel (PLAVIX) tablet 75 mg (75 mg Oral Given 03/02/22 1436)    Vitals:   03/02/22 1308 03/02/22 1400 03/02/22 1415 03/02/22 1430  BP:  (!) 184/93 (!) 178/78 (!) 155/88  Pulse: (!) 103 (!) 111 (!) 108 (!) 103  Resp: (!) 21 14 17 16   Temp:      TempSrc:      SpO2: 99% 95% 100% 98%  Weight:      Height:        Final diagnoses:  Acute left-sided weakness    Admission/ observation were discussed with the admitting physician, patient and/or family and they are comfortable with the plan.          Final Clinical Impression(s) / ED Diagnoses Final diagnoses:  None    Rx / DC Orders ED Discharge Orders     None         , DO 03/02/22  1513

## 2022-03-02 NOTE — Progress Notes (Signed)
Plan of Care Note for accepted transfer   Patient: Susan Marsh MRN: [id number]   DOA: 03/02/2022  Facility [facility] transfer: [facility] Requesting Provider: Adela Lank Reason for transfer: Acute CVA  Facility course: Susan Marsh is a 81 y.o. female with past medical history of AS; HTN; HLD; and CAD s/p stent presenting with L-sided weakness.  She was last admitted from 10/29-11/3 with erratic behavior, visual hallucinations, and poor PO intake that was thought to be related to AKI, dehydration, and hyponatremia.   Today, she is presenting with an acute stroke. Family reports that she had multiple falls yesterday. Near complete left-sided paralysis with neglect. Outside of the window for intervention. Needs admission for stroke work-up.    Plan of care: The patient is accepted for admission to Progressive unit, at [facility].   Author: Jonah Blue, MD 03/02/2022  Check [email or web address] for on-call coverage.  Nursing staff, Please call [facility] Admits & Consults System-Wide number on Amion as soon as patient's arrival, so appropriate admitting provider can evaluate the pt.

## 2022-03-02 NOTE — Assessment & Plan Note (Addendum)
Pt with L sided weakness / hemi neglect and some speech difficulties. CT head = age indeterminate R caudate stroke CTP = no abnormalities, but not felt to have picked up the R caudate on study Stroke pathway Tele monitor MRI brain Neuro consulted ASA 81 daily Plavix 75 for 21 days PT/OT/SLP 2d echo A1C, FLP

## 2022-03-02 NOTE — ED Notes (Signed)
Pt's CBG result was 101. Informed Lupita Leash - RN.

## 2022-03-02 NOTE — Consult Note (Signed)
NEUROLOGY TELECONSULTATION NOTE   Date of service: March 02, 2022 Patient Name: Susan Marsh MRN:  [id number] DOB:  23-Apr-1940 Reason for consult: telestroke  Requesting Provider: Dr. Melene Plan Consult Participants: myself, patient, bedside RN, telestroke RN Location of the provider: [place] Location of the patient: [facility]  This consult was provided via telemedicine with 2-way video and audio communication. The patient/family was informed that care would be provided in this way and agreed to receive care in this manner.   _ _ _   _ __   _ __ _ _  __ __   _ __   __ _  History of Present Illness   This is a 81 yo woman with hx AS, CAD, HTN who presents with L face and arm weakness, L hemineglect, and impaired naming. LKW approx 1400 yesterday (23 hrs). After that family noticed her arm was weak but she worsened until today when they brought her in. At baseline she is  very functional, lives with family but is independent, cleans the house, performs all of her own ADLs. mRS = 1. NIHSS = 10. Head CT showed possible acute infarct R caudate personal review. Patient was not a TNK candidate 2/2 presentation outside the window. CTA showed no intervenable lesion. CTP did not show any core or ischemia but is felt to have not picked up the R caudate ischemia which localizes to her sx.   ROS   Per HPI; all other systems reviewed and are negative  Past History   The following was personally reviewed:  Past Medical History:  Diagnosis Date   Aortic stenosis    ASCVD (arteriosclerotic cardiovascular disease)    s/p stent   GERD (gastroesophageal reflux disease)    Hearing loss    R>L   High cholesterol    Hypertension    Osteoarthritis    Past Surgical History:  Procedure Laterality Date   BLADDER SURGERY     CARDIAC SURGERY     Family History  Problem Relation Age of Onset   Other Mother        unsure of history   Other Father        unsure of history   Heart  disease Brother    Social History   Socioeconomic History   Marital status: Unknown    Spouse name: Not on file   Number of children: 4   Years of education: 12   Highest education level: High school graduate  Occupational History   Occupation: Retired  Tobacco Use   Smoking status: Never   Smokeless tobacco: Not on file  Substance and Sexual Activity   Alcohol use: Never   Drug use: Never   Sexual activity: Not on file  Other Topics Concern   Not on file  Social History Narrative   Lives with son.   Right-handed.   No daily caffeine.   Social Determinants of Health   Financial Resource Strain: Not on file  Food Insecurity: No Food Insecurity (01/23/2022)   Hunger Vital Sign    Worried About Running Out of Food in the Last Year: Never true    Ran Out of Food in the Last Year: Never true  Transportation Needs: No Transportation Needs (01/23/2022)   PRAPARE - Administrator, Civil Service (Medical): No    Lack of Transportation (Non-Medical): No  Physical Activity: Not on file  Stress: Not on file  Social Connections: Not on file   Allergies  Allergen Reactions   Alendronate Sodium     Other reaction(s): GI upset   Other     Cortisporin otic Other reaction(s): GI upset    Medications   (Not in a hospital admission)    No current facility-administered medications for this encounter.  Current Outpatient Medications:    amLODipine (NORVASC) 10 MG tablet, Take 10 mg by mouth daily., Disp: , Rfl:    aspirin 81 MG chewable tablet, Chew 81 mg by mouth daily., Disp: , Rfl:    famotidine (PEPCID) 20 MG tablet, Take 1 tablet (20 mg total) by mouth 2 (two) times daily., Disp: 60 tablet, Rfl: 0   Lidocaine (ZTLIDO) 1.8 % PTCH, Apply 1.8 patches topically as needed. (Patient taking differently: Apply 1.8 patches topically as needed (for leg pain).), Disp: 30 patch, Rfl: 6   metoprolol (LOPRESSOR) 50 MG tablet, Take 50 mg by mouth 2 (two) times daily., Disp: ,  Rfl:    ondansetron (ZOFRAN-ODT) 4 MG disintegrating tablet, Take 4 mg by mouth 3 (three) times daily., Disp: , Rfl:    potassium chloride SA (KLOR-CON M) 20 MEQ tablet, Take 1 tablet (20 mEq total) by mouth 2 (two) times daily., Disp: 30 tablet, Rfl: 0   pravastatin (PRAVACHOL) 40 MG tablet, Take 40 mg by mouth daily., Disp: , Rfl:   Vitals   Vitals:   03/02/22 1252 03/02/22 1307 03/02/22 1308  BP:  (!) 167/116   Pulse:  (!) 105 (!) 103  Resp:  20 (!) 21  Temp:  98.4 F (36.9 C)   TempSrc:  Oral   SpO2:  100% 99%  Weight: 62.3 kg    Height: 5\' 3"  (1.6 m)       Body mass index is 24.33 kg/m.  Physical Exam   Exam performed over telemedicine with 2-way video and audio communication and with assistance of bedside RN  Physical Exam Gen: A&O x4, NAD Resp: normal WOB CV: extremities appear well-perfused  Neuro: *MS: A&O x4. Follows single step commands *Speech: mild dysarthria, mildly impaired naming *CN: PERRL 59mm, EOMI, blinks to threat bilat, sensation intact, L UMN facial droop, hearing significantly impaired to voice *Motor:   Normal bulk.  No tremor, rigidity or bradykinesia. RUE and RLE full strength, LUE minimal movement none against gravity, LLE drift but not to bed *Sensory: SILT. Symmetric. L hemineglect (visual and tactile) *Coordination:  FNF intact on R *Reflexes:  UTA 2/2 tele-exam *Gait: deferred  NIHSS  1a Level of Conscious.: 0 1b LOC Questions: 0 1c LOC Commands: 0 2 Best Gaze: 0 3 Visual: 0 4 Facial Palsy: 2 5a Motor Arm - left: 3 5b Motor Arm - Right: 0 6a Motor Leg - Left: 1 6b Motor Leg - Right: 0 7 Limb Ataxia: 0 8 Sensory: 0 9 Best Language: 1 10 Dysarthria: 1 11 Extinct. and Inatten.: 2  TOTAL: 10   Premorbid mRS = 2   Labs   CBC:  Recent Labs  Lab 03/02/22 1305  WBC 10.7*  NEUTROABS 7.8*  HGB 16.1*  HCT 46.5*  MCV 86.8  PLT 305    Basic Metabolic Panel:  Lab Results  Component Value Date   NA 130 (L) 01/23/2022    K 3.2 (L) 01/23/2022   CO2 23 01/23/2022   GLUCOSE 145 (H) 01/23/2022   BUN 15 01/23/2022   CREATININE 0.87 01/23/2022   CALCIUM 9.5 01/23/2022   GFRNONAA >60 01/23/2022   GFRAA >60 02/18/2015   Lipid Panel: No results found for: "LDLCALC"  HgbA1c: No results found for: "HGBA1C" Urine Drug Screen:     Component Value Date/Time   LABOPIA NONE DETECTED 01/19/2022 0802   COCAINSCRNUR NONE DETECTED 01/19/2022 0802   LABBENZ NONE DETECTED 01/19/2022 0802   AMPHETMU NONE DETECTED 01/19/2022 0802   THCU NONE DETECTED 01/19/2022 0802   LABBARB NONE DETECTED 01/19/2022 0802    Alcohol Level     Component Value Date/Time   ETH <10 01/19/2022 0314    Impression   This is a 81 yo woman with hx AS, CAD, HTN who presents with L face and arm weakness, L hemineglect, and impaired naming. LKW approx 1400 yesterday (23 hrs). After that family noticed her arm was weak but she worsened until today when they brought her in. At baseline she is  very functional, lives with family but is independent, cleans the house, performs all of her own ADLs. mRS = 1. NIHSS = 10. Head CT showed possible acute infarct R caudate personal review. Patient was not a TNK candidate 2/2 presentation outside the window. CTA showed no intervenable lesion. CTP did not show any core or ischemia but is felt to have not picked up the R caudate ischemia which localizes to her sx.   Recommendations   - Admit to [facility] for stroke workup - Permissive HTN x48 hrs from sx onset goal BP <220/110. PRN labetalol or hydralazine if BP above these parameters. Avoid oral antihypertensives. - MRI brain wo contras - TTE - Check A1c and LDL + add statin per guidelines - ASA 81mg  daily + plavix 75mg  daily x21 days f/b ASA 81mg  daily monotherapy after that - q4 hr neuro checks - STAT head CT for any change in neuro exam - Tele - PT/OT/SLP - Stroke education - Amb referral to neurology upon discharge - Please page neurohospitalist on patient  arrival to [facility], stroke team will follow  ______________________________________________________________________   Thank you for the opportunity to take part in the care of this patient. If you have any further questions, please contact the neurology consultation attending.  Signed,  , MD [facility] [phone]  If 7pm- 7am, please page neurology on call as listed in AMION.  **Any copied and pasted documentation in this note was written by me in another application not billed for and pasted by me into this document.

## 2022-03-02 NOTE — ED Notes (Signed)
Pt placed on bedpan. Pt refused the Purewick.

## 2022-03-02 NOTE — Assessment & Plan Note (Signed)
Replace K+

## 2022-03-03 ENCOUNTER — Inpatient Hospital Stay (HOSPITAL_COMMUNITY): Payer: Medicare HMO

## 2022-03-03 DIAGNOSIS — I6389 Other cerebral infarction: Secondary | ICD-10-CM

## 2022-03-03 DIAGNOSIS — I639 Cerebral infarction, unspecified: Secondary | ICD-10-CM | POA: Diagnosis not present

## 2022-03-03 DIAGNOSIS — I10 Essential (primary) hypertension: Secondary | ICD-10-CM | POA: Diagnosis not present

## 2022-03-03 DIAGNOSIS — E876 Hypokalemia: Secondary | ICD-10-CM | POA: Diagnosis not present

## 2022-03-03 LAB — LIPID PANEL
Cholesterol: 112 mg/dL (ref 0–200)
HDL: 36 mg/dL — ABNORMAL LOW (ref >40)
LDL Cholesterol: 63 mg/dL (ref 0–99)
Total CHOL/HDL Ratio: 3.1 RATIO
Triglycerides: 67 mg/dL (ref <150)
VLDL: 13 mg/dL (ref 0–40)

## 2022-03-03 LAB — HEMOGLOBIN A1C
Hgb A1c MFr Bld: 5.9 % — ABNORMAL HIGH (ref 4.8–5.6)
Mean Plasma Glucose: 123 mg/dL

## 2022-03-03 LAB — ECHOCARDIOGRAM COMPLETE
Calc EF: 66.3 %
Height: 63 in
S' Lateral: 1.9 cm
Single Plane A2C EF: 70.8 %
Single Plane A4C EF: 65 %
Weight: 2197.55 oz

## 2022-03-03 LAB — GLUCOSE, CAPILLARY
Glucose-Capillary: 108 mg/dL — ABNORMAL HIGH (ref 70–99)
Glucose-Capillary: 103 mg/dL — ABNORMAL HIGH (ref 70–99)
Glucose-Capillary: 110 mg/dL — ABNORMAL HIGH (ref 70–99)

## 2022-03-03 LAB — BASIC METABOLIC PANEL
Anion gap: 17 — ABNORMAL HIGH (ref 5–15)
BUN: 18 mg/dL (ref 8–23)
CO2: 16 mmol/L — ABNORMAL LOW (ref 22–32)
Calcium: 9.5 mg/dL (ref 8.9–10.3)
Chloride: 109 mmol/L (ref 98–111)
Creatinine, Ser: 0.65 mg/dL (ref 0.44–1.00)
GFR, Estimated: >60 mL/min (ref >60)
Glucose, Bld: 120 mg/dL — ABNORMAL HIGH (ref 70–99)
Potassium: 3.4 mmol/L — ABNORMAL LOW (ref 3.5–5.1)
Sodium: 142 mmol/L (ref 135–145)

## 2022-03-03 MED ORDER — METOPROLOL TARTRATE 50 MG PO TABS
50.0000 mg | ORAL_TABLET | Freq: Two times a day (BID) | ORAL | Status: DC
  Administered 2022-03-03 – 2022-03-06 (×7): 50 mg via ORAL
  Filled 2022-03-03 (×7): qty 1

## 2022-03-03 MED ORDER — POTASSIUM CHLORIDE 10 MEQ/100ML IV SOLN
INTRAVENOUS | Status: AC
  Administered 2022-03-03: 10 meq
  Filled 2022-03-03: qty 100

## 2022-03-03 MED ORDER — MAGNESIUM SULFATE 2 GM/50ML IV SOLN
2.0000 g | Freq: Once | INTRAVENOUS | Status: AC
  Administered 2022-03-03: 2 g via INTRAVENOUS
  Filled 2022-03-03: qty 50

## 2022-03-03 MED ORDER — POTASSIUM CHLORIDE 10 MEQ/100ML IV SOLN: 10.0000 meq | Freq: Once | INTRAVENOUS | Status: AC

## 2022-03-03 MED ORDER — PRAVASTATIN SODIUM 40 MG PO TABS
40.0000 mg | ORAL_TABLET | Freq: Every day | ORAL | Status: DC
  Administered 2022-03-03 – 2022-03-06 (×4): 40 mg via ORAL
  Filled 2022-03-03 (×4): qty 1

## 2022-03-03 MED ORDER — PERFLUTREN LIPID MICROSPHERE
1.0000 mL | INTRAVENOUS | Status: AC | PRN
  Administered 2022-03-03: 3 mL via INTRAVENOUS

## 2022-03-03 MED ORDER — POTASSIUM CHLORIDE CRYS ER 20 MEQ PO TBCR
40.0000 meq | EXTENDED_RELEASE_TABLET | Freq: Once | ORAL | Status: AC
  Administered 2022-03-03: 40 meq via ORAL
  Filled 2022-03-03: qty 2

## 2022-03-03 MED ORDER — SODIUM CHLORIDE 0.9 % IV SOLN
1.0000 g | INTRAVENOUS | Status: DC
  Administered 2022-03-03 – 2022-03-05 (×3): 1 g via INTRAVENOUS
  Filled 2022-03-03 (×4): qty 10

## 2022-03-03 MED ORDER — MELATONIN 3 MG PO TABS
3.0000 mg | ORAL_TABLET | Freq: Every evening | ORAL | Status: DC | PRN
  Administered 2022-03-03 – 2022-03-04 (×4): 3 mg via ORAL
  Filled 2022-03-03 (×3): qty 1

## 2022-03-03 NOTE — TOC Initial Note (Signed)
Transition of Care [facility]) - Initial/Assessment Note    Patient Details  Name: Susan Marsh MRN: [id number] Date of Birth: Oct 26, 1940  Transition of Care [facility]) CM/SW Contact:    Pollie Friar, RN Phone Number: 03/03/2022, 3:41 PM  Clinical Narrative:                 CM met with the patient and one of her daughters in the room. Pt lives with her son that is there in the evenings and at night. She is alone during the daytime.  Patient was driving self and managing her own medications.  Daughter asked that the other daughter, Malachy Mood be contacted for SNF recommendations.  CM called and spoke to [place] and she was in agreement with having pt faxed out in the [facility] area. CM has also provided her the list of facilities per medicare.gov by email.  Will f/u with Malachy Mood in am on bed offers and their choice.   Expected Discharge Plan: Skilled Nursing Facility Barriers to Discharge: Continued Medical Work up   Patient Goals and CMS Choice   CMS Medicare.gov Compare Post Acute Care list provided to:: Patient Represenative (must comment) Choice offered to / list presented to : Adult Children  Expected Discharge Plan and Services Expected Discharge Plan: [place] In-house Referral: Clinical Social Work Discharge Planning Services: CM Consult Post Acute Care Choice: [place] Living arrangements for the past 2 months: [place]                                      Prior Living Arrangements/Services Living arrangements for the past 2 months: Single Family Home Lives with:: Adult Children Patient language and need for interpreter reviewed:: Yes Do you feel safe going back to the place where you live?: Yes            Criminal Activity/Legal Involvement Pertinent to Current Situation/Hospitalization: No - Comment as needed  Activities of Daily Living      Permission Sought/Granted                  Emotional  Assessment Appearance:: Appears stated age     Orientation: : Oriented to Place, Oriented to  Time, Oriented to Situation, Oriented to Self   Psych Involvement: No (comment)  Admission diagnosis:  Acute left-sided weakness [R53.1] Acute CVA (cerebrovascular accident) [facility]) [I63.9] Patient Active Problem List   Diagnosis Date Noted   Acute ischemic stroke ([place]) 03/02/2022   Pyuria 03/02/2022   Hypokalemia 03/02/2022   HTN (hypertension) 03/02/2022   Hyponatremia 01/19/2022   PCP:  Shirline Frees, MD Pharmacy:   [facility] [address] [place] [address] Phone: [phone] Fax: [phone]     Social Determinants of Health (SDOH) Interventions    Readmission Risk Interventions     No data to display

## 2022-03-03 NOTE — Plan of Care (Signed)
Received message on Secure Chat from Deatra James, RN about family wanting to speak to someone from neurology team.  I went to the room to speak with patient's son along with other members of patient's family.  Family had questions regarding what the plan was moving forward.  Explained to family: The stroke has happened and there is nothing to be done to reverse it One goal is to reduce the risk of another stroke by finding a potential embolic source through imaging and cardiac monitoring devices as well as medication management. Another goal is to restore function through PT/OT/SLP therapy, rehab  Family was grateful for explanation. Informed family the neurology team will be back tomorrow if they have any other questions.

## 2022-03-03 NOTE — Progress Notes (Signed)
[facility] PROGRESS NOTE   Susan Marsh WUJ:[id number] DOB: 08/19/1940 DOA: 03/02/2022  PCP: Johny BlamerHarris, William, MD  Brief History/Interval Summary: 81 y.o. female with medical history significant of HTN, HLD, cad S/P stent, aortic stenosis. Pt last admitted from 10/29-11/3 with erratic behavior, visual hallucinations, and poor PO intake that was thought to be related to AKI, dehydration, and hyponatremia. At baseline pt fully functional, lives independently, and does all ADLs.  Presented to the ED with left-sided weakness.  Found to have acute stroke.  Hospitalized for further management.  Consultants: Neurology  Procedures: Transthoracic echocardiogram    Subjective/Interval History: Patient complains of left-sided weakness.  Denies any chest pain or shortness of breath.  Does complain of pain with urinating.    Assessment/Plan:  Acute ischemic stroke Patient with left-sided weakness. MRI shows numerous small areas of acute ischemia in the right hemisphere and also with the left hemisphere. Patient underwent CT angiogram head and neck.  Neurology to weigh in.  No significant nonvascular abnormalities noted. LDL is 63.  HbA1c level is pending.  Patient noted to be on statin. PT OT SLP evaluation. Echocardiogram is pending. Patient noted to be on aspirin and Plavix. Further management per neurology.  Urinary tract infection Complains of discomfort with urinating.  UA noted to be abnormal.  Follow-up on urine culture.  Start ceftriaxone for now.  Hypokalemia She was supplemented.  Potassium level has improved.  Will give additional dose today.  Magnesium was 1.9.  SVT Brief runs of SVT noted on telemetry.  TSH was normal back in October. Patient supposed to be on beta-blocker prior to admission.  This has been resumed.  Continue to monitor on telemetry for now.  Follow-up on echocardiogram. Correct electrolytes.  Essential hypertension Currently allowing  permissive hypertension. Patient was on amlodipine losartan and metoprolol prior to admission.  Metabolic acidosis with mildly elevated anion gap Patient does not have any history of diabetes.  Bicarbonate level was normal yesterday.  Will recheck labs tomorrow.   DVT Prophylaxis: Lovenox Code Status: Full code Family Communication: Discussed with patient.  No family at bedside Disposition Plan: To be determined  Status is: Inpatient Remains inpatient appropriate because: Acute stroke      Medications: Scheduled:  aspirin EC  81 mg Oral Daily   clopidogrel  75 mg Oral Daily   enoxaparin (LOVENOX) injection  40 mg Subcutaneous Q24H   metoprolol tartrate  50 mg Oral BID   pravastatin  40 mg Oral Daily   Continuous:  cefTRIAXone (ROCEPHIN)  IV 1 g (03/03/22 0942)   NFA:OZHYQMVHQIONGPRN:acetaminophen **OR** acetaminophen (TYLENOL) oral liquid 160 mg/5 mL **OR** acetaminophen, melatonin  Antibiotics: Anti-infectives (From admission, onward)    Start     Dose/Rate Route Frequency Ordered Stop   03/03/22 0945  cefTRIAXone (ROCEPHIN) 1 g in sodium chloride 0.9 % 100 mL IVPB        1 g 200 mL/hr over 30 Minutes Intravenous Every 24 hours 03/03/22 0849         Objective:  Vital Signs  Vitals:   03/02/22 1940 03/03/22 0041 03/03/22 0433 03/03/22 0834  BP: (!) 154/96 (!) 181/88  (!) 173/85  Pulse: (!) 118 (!) 110  (!) 101  Resp: 18 (!) 22  18  Temp: 97.8 F (36.6 C) 98.9 F (37.2 C) 98.7 F (37.1 C) 98.6 F (37 C)  TempSrc: Oral Oral Oral Oral  SpO2: 98% 99% 98% 96%  Weight:      Height:  Intake/Output Summary (Last 24 hours) at 03/03/2022 1023 Last data filed at 03/02/2022 1820 Gross per 24 hour  Intake 1100 ml  Output 175 ml  Net 925 ml   Filed Weights   03/02/22 1252  Weight: 62.3 kg    General appearance: Awake alert.  In no distress Resp: Clear to auscultation bilaterally.  Normal effort Cardio: S1-S2 is normal regular.  No S3-S4.  No rubs murmurs or  bruit GI: Abdomen is soft.  Nontender nondistended.  Bowel sounds are present normal.  No masses organomegaly Extremities: No edema.   Neurologic: Left hemiparesis noted   Lab Results:  Data Reviewed: I have personally reviewed following labs and reports of the imaging studies  CBC: Recent Labs  Lab 03/02/22 1305  WBC 10.7*  NEUTROABS 7.8*  HGB 16.1*  HCT 46.5*  MCV 86.8  PLT 305    Basic Metabolic Panel: Recent Labs  Lab 03/02/22 1305 03/02/22 1415 03/03/22 0305  NA 142  --  142  K 2.7*  --  3.4*  CL 105  --  109  CO2 23  --  16*  GLUCOSE 125*  --  120*  BUN 25*  --  18  CREATININE 0.82  --  0.65  CALCIUM 10.5*  --  9.5  MG  --  1.9  --     GFR: Estimated Creatinine Clearance: 46.4 mL/min (by C-G formula based on SCr of 0.65 mg/dL).  Liver Function Tests: Recent Labs  Lab 03/02/22 1305  AST 20  ALT 28  ALKPHOS 67  BILITOT 1.1  PROT 9.4*  ALBUMIN 5.2*     Coagulation Profile: Recent Labs  Lab 03/02/22 1305  INR 1.0     CBG: Recent Labs  Lab 03/02/22 1339  GLUCAP 101*    Lipid Profile: Recent Labs    03/03/22 0305  CHOL 112  HDL 36*  LDLCALC 63  TRIG 67  CHOLHDL 3.1    Radiology Studies: MR BRAIN WO CONTRAST  Result Date: 03/02/2022 CLINICAL DATA:  Acute neurologic deficit EXAM: MRI HEAD WITHOUT CONTRAST TECHNIQUE: Multiplanar, multiecho pulse sequences of the brain and surrounding structures were obtained without intravenous contrast. COMPARISON:  None Available. FINDINGS: Brain: Numerous small areas of acute ischemia throughout the right hemisphere, in multiple vascular territories. There are a few foci of acute ischemia in the left hemisphere, also in multiple vascular territories. The greatest burden is in the right MCA territory. No acute or chronic hemorrhage. There is confluent hyperintense T2-weighted signal within the white matter. Generalized volume loss. The midline structures are normal. Vascular: Major flow voids are  preserved. Skull and upper cervical spine: Normal calvarium and skull base. Visualized upper cervical spine and soft tissues are normal. Sinuses/Orbits:No paranasal sinus fluid levels or advanced mucosal thickening. No mastoid or middle ear effusion. Normal orbits. IMPRESSION: 1. Numerous small areas of acute ischemia throughout the right hemisphere, in multiple vascular territories. The greatest burden is in the right MCA territory. No hemorrhage or mass effect. 2. A few foci of acute ischemia in the left hemisphere, also in multiple vascular territories. 3. Pattern is most consistent with a central embolic source (heart or proximal aorta). Electronically Signed   By: Deatra Robinson M.D.   On: 03/02/2022 22:13   CT ANGIO HEAD NECK W WO CM W PERF (CODE STROKE)  Result Date: 03/02/2022 CLINICAL DATA:  Stroke code EXAM: CT ANGIOGRAPHY HEAD AND NECK CT PERFUSION BRAIN TECHNIQUE: Multidetector CT imaging of the head and neck was performed using the  standard protocol during bolus administration of intravenous contrast. Multiplanar CT image reconstructions and MIPs were obtained to evaluate the vascular anatomy. Carotid stenosis measurements (when applicable) are obtained utilizing NASCET criteria, using the distal internal carotid diameter as the denominator. Multiphase CT imaging of the brain was performed following IV bolus contrast injection. Subsequent parametric perfusion maps were calculated using RAPID software. RADIATION DOSE REDUCTION: This exam was performed according to the departmental dose-optimization program which includes automated exposure control, adjustment of the mA and/or kV according to patient size and/or use of iterative reconstruction technique. CONTRAST:  OMNIPAQUE IOHEXOL 350 MG/ML SOLN COMPARISON:  Same-day CT brain FINDINGS: CT HEAD FINDINGS Please see same-day CT brain for intracranial findings. No contrast-enhancing lesion is visualized. CTA NECK FINDINGS Aortic arch: Standard  branching. Imaged portion shows no evidence of aneurysm or dissection. No significant stenosis of the major arch vessel origins. Right carotid system: No evidence of dissection, stenosis (50% or greater) or occlusion. Mild narrowing of the origin of the right ICA. Left carotid system: No evidence of dissection, stenosis (50% or greater) or occlusion. Mild narrowing of the origin of the left ICA. Vertebral arteries: Left dominant. There is likely mild-to-moderate stenosis of the V4 segment of the left vertebral artery. Skeleton: Multilevel degenerative changes in the cervical spine. No evidence of high-grade spinal canal stenosis. Other neck: None Upper chest: None Review of the MIP images confirms the above findings CTA HEAD FINDINGS Anterior circulation: Mild narrowing of the mid M2 segment of the right MCA (series 10, image 82). No aneurysm. Posterior circulation: Moderate stenosis in the P2 segment of the left PCA (series 10, image 83). Mild-to-moderate narrowing of the origin of the left IJ. Venous sinuses: As permitted by contrast timing, patent. Anatomic variants: None Review of the MIP images confirms the above findings CT Brain Perfusion Findings: ASPECTS: 8 CBF (<30%) Volume: 36mL Perfusion (Tmax>6.0s) volume: 8mL Mismatch Volume: 52mL Infarction Location:None IMPRESSION: 1. No intracranial large vessel occlusion. Moderate stenosis in the P2 segment of the left PCA and mild narrowing of the mid M2 segment of the right MCA. 2. Mild stenosis of the origins of bilateral ICAs. 3. No evidence of infarct core or penumbra on CT perfusion. Electronically Signed   By: Lorenza Cambridge M.D.   On: 03/02/2022 14:25   CT HEAD CODE STROKE WO CONTRAST  Result Date: 03/02/2022 CLINICAL DATA:  Code stroke.  No lateralizing information available. EXAM: CT HEAD WITHOUT CONTRAST TECHNIQUE: Contiguous axial images were obtained from the base of the skull through the vertex without intravenous contrast. RADIATION DOSE REDUCTION:  This exam was performed according to the departmental dose-optimization program which includes automated exposure control, adjustment of the mA and/or kV according to patient size and/or use of iterative reconstruction technique. COMPARISON:  None Available. FINDINGS: Brain: There is an age indeterminate infarct in the right caudate head. Focal hypodensities in the right frontal lobe (series 3, image 15, 16). There is sequela mild chronic microvascular ischemic change. No hemorrhage. No hydrocephalus. No extra-axial fluid collection. Vascular: There is dense calcification at the right M1 M2 junction and along the sylvian and opercular segments of the distal right MCA branches. Skull: Normal. Negative for fracture or focal lesion. Sinuses/Orbits: Right lens replacement. Paranasal sinuses are clear. Other: None ASPECTS (Alberta Stroke Program Early CT Score): 8 - Ganglionic level infarction (caudate, lentiform nuclei, internal capsule, insula, M1-M3 cortex): 6 - Supraganglionic infarction (M4-M6 cortex): 2 IMPRESSION: 1. Age-indeterminate infarcts in the right caudate head and possibly the right  frontal opercular region. No hemorrhage. Aspects is 8. 2. Dense calcification at the right M1/M2 junction and along the sylvian and opercular segments of the distal right MCA branches. Findings were discussed with Dr. Adela Lank on 03/02/22 at 1:26 PM. Electronically Signed   By: Lorenza Cambridge M.D.   On: 03/02/2022 13:28       LOS: 1 day   Osvaldo Shipper  [facility] Pager on [email or web address]  03/03/2022, 10:23 AM

## 2022-03-03 NOTE — Progress Notes (Signed)
OT Cancellation Note  Patient Details Name: Susan Marsh MRN: [id number] DOB: 11/16/1940   Cancelled Treatment:    Reason Eval/Treat Not Completed: Patient at procedure or test/ unavailable. ECHO in with pt at present time.  Ignacia Palma, OTR/L [facility] [phone] Office [phone]    Susan Marsh 03/03/2022, 10:21 AM

## 2022-03-03 NOTE — Progress Notes (Signed)
Occupational Therapy Evaluation Patient Details Name: Susan Marsh MRN: [id number] DOB: 04-01-1940 Today's Date: 03/03/2022   History of Present Illness 81 y/o female presented to ED with L sided weakness, L hemineglect. Impaired naming. Family notied arm weakness that was getting worse when they brought her to ED.   Clinical Impression   This 81 yo female admitted with above presents to acute OT with PLOF of being totally independent with all basic ADLs without AD, cooking, and driving. Currently she does not have any movement of her left side and has left inattention/neglect with both affecting her safety and independence with even basic ADLs. BADL level currently is setup.S-total A. She will continue to benefit from acute OT with follow up at SNF.      Recommendations for follow up therapy are one component of a multi-disciplinary discharge planning process, led by the attending physician.  Recommendations may be updated based on patient status, additional functional criteria and insurance authorization.   Follow Up Recommendations  Skilled nursing-short term rehab (<3 hours/day)     Assistance Recommended at Discharge Frequent or constant Supervision/Assistance  Patient can return home with the following A lot of help with walking and/or transfers;A lot of help with bathing/dressing/bathroom;Assistance with cooking/housework;Assistance with feeding;Help with stairs or ramp for entrance;Assist for transportation;Direct supervision/assist for financial management;Direct supervision/assist for medications management    Functional Status Assessment  Patient has had a recent decline in their functional status and demonstrates the ability to make significant improvements in function in a reasonable and predictable amount of time.  Equipment Recommendations  Other (comment) (TBD next venue)       Precautions / Restrictions Precautions Precautions: Fall Precaution Comments: L  neglect Restrictions Weight Bearing Restrictions: No      Mobility Bed Mobility               General bed mobility comments: pt up in recliner upon my arrival    Transfers Overall transfer level: Needs assistance   Transfers: Sit to/from Stand, Bed to chair/wheelchair/BSC Sit to Stand: Mod assist Stand pivot transfers: Mod assist Squat pivot transfers: Mod assist              Balance Overall balance assessment: Needs assistance Sitting-balance support: Single extremity supported, Feet supported Sitting balance-Leahy Scale: Poor     Standing balance support: Single extremity supported Standing balance-Leahy Scale: Poor                             ADL either performed or assessed with clinical judgement   ADL Overall ADL's : Needs assistance/impaired Eating/Feeding: Supervision/ safety;Set up;Sitting Eating/Feeding Details (indicate cue type and reason): in recliner Grooming: Maximal assistance;Sitting Grooming Details (indicate cue type and reason): in recliner Upper Body Bathing: Moderate assistance;Sitting Upper Body Bathing Details (indicate cue type and reason): in recliner Lower Body Bathing: Total assistance Lower Body Bathing Details (indicate cue type and reason): Mod A sit<>stand Upper Body Dressing : Total assistance;Sitting Upper Body Dressing Details (indicate cue type and reason): recliner Lower Body Dressing: Total assistance Lower Body Dressing Details (indicate cue type and reason): Mod A sit<>stand Toilet Transfer: Maximal assistance;Stand-pivot;Squat-pivot Toilet Transfer Details (indicate cue type and reason): chair to [employer] Toileting- Clothing Manipulation and Hygiene: Total assistance Toileting - Clothing Manipulation Details (indicate cue type and reason): Mod A sit<>stand             Vision Baseline Vision/History: 1 Wears glasses (reading only pta) Ability to  See in Adequate Light: 0 Adequate Patient Visual Report:  No change from baseline Additional Comments: Pt cannot follow testing for tracking, tends to keep eyes and head turned to right, with cues (gestural, verbal and tactile she wil turn her head and eyes to the left, but not maintain).            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 Pt was alert and oriented x4 with OT; some one step commands she followed right away and others took her longer or I had to repeat.                Home Living Family/patient expects to be discharged to:: Private residence Living Arrangements: Children Available Help at Discharge: Family;Available PRN/intermittently Type of Home: House Home Access: Level entry     Home Layout: One level     Bathroom Shower/Tub: Chief Strategy Officer: Standard     Home Equipment: None   Additional Comments: son works, no one to stay with patient 24/7 per dtr in room      Prior Functioning/Environment Prior Level of Function : Independent/Modified Independent             Mobility Comments: no AD          OT Problem List: Decreased strength;Decreased range of motion;Impaired balance (sitting and/or standing);Impaired vision/perception;Decreased coordination;Decreased cognition;Decreased safety awareness;Impaired UE functional use      OT Treatment/Interventions: Self-care/ADL training;DME and/or AE instruction;Balance training;Patient/family  education;Therapeutic activities;Visual/perceptual remediation/compensation    OT Goals(Current goals can be found in the care plan section) Acute Rehab OT Goals Patient Stated Goal: did not state OT Goal Formulation: Patient unable to participate in goal setting Time For Goal Achievement: 03/17/22 Potential to Achieve Goals: Fair  OT Frequency: Min 2X/week       AM-PAC OT "6 Clicks" Daily Activity     Outcome Measure Help from another person eating meals?: A Little Help from another person taking care of personal grooming?: A Lot Help from another person toileting, which includes using toliet, bedpan, or urinal?: Total Help from another person bathing (including washing, rinsing, drying)?: Total Help from another person to put on and taking off regular upper body clothing?: Total Help from another person to put on and taking off regular lower body clothing?: Total 6 Click Score: 9   End of Session Equipment Utilized During Treatment: Gait belt  Activity Tolerance: Patient tolerated treatment well Patient left: in chair;with call bell/phone within reach;with chair alarm set  OT Visit Diagnosis: Unsteadiness on feet (R26.81);Other abnormalities of gait and mobility (R26.89);Muscle weakness (generalized) (M62.81);Low vision, both eyes (H54.2);Feeding difficulties (R63.3);Cognitive communication deficit (R41.841);Other symptoms and signs involving cognitive function;Hemiplegia and hemiparesis Symptoms and signs involving cognitive functions: Cerebral infarction Hemiplegia - Right/Left: Right Hemiplegia - dominant/non-dominant: Non-Dominant Hemiplegia - caused by: Cerebral infarction  Time: 8891-6945 OT Time Calculation (min): 21 min Charges:  OT General Charges $OT Visit: 1 Visit OT Evaluation $OT Eval Moderate Complexity: 1 Mod  Ignacia Palma, OTR/L [facility] [employer] [facility] [phone] Office [phone]    Evette Georges 03/03/2022,  2:20 PM

## 2022-03-03 NOTE — Evaluation (Signed)
Physical Therapy Evaluation Patient Details Name: Susan Marsh MRN: [id number] DOB: 02/05/1941 Today's Date: 03/03/2022  History of Present Illness  81 y/o female presented to ED with L sided weakness, L hemineglect. Impaired naming. Family notied arm weakness that was getting worse when they brought her to ED.  Clinical Impression  Pt demonstrates no voluntary movement of the LUE/LLE and sat at the far right hand side of the recliner with transfers. Pt difficult to assess pt strength in the LLE due to what appears to be possible L sided neglect. Pt was able to stand without significant hyperextension of the LLE or immediate buckling demonstrating some strength/automatic control is present. Pt is a heavy one person assist for bed mobility and transfers. Pt will benefit from skilled physical therapy services in SNF setting following discharge from acute care hospital setting in order to decrease risk for falls, injury and re-hospitalization working toward improved mobility. Pt demonstrated no cardiac/respiratory distress throughout session.       Recommendations for follow up therapy are one component of a multi-disciplinary discharge planning process, led by the attending physician.  Recommendations may be updated based on patient status, additional functional criteria and insurance authorization.  Follow Up Recommendations Skilled nursing-short term rehab (<3 hours/day) Can patient physically be transported by private vehicle: No    Assistance Recommended at Discharge Frequent or constant Supervision/Assistance  Patient can return home with the following  A lot of help with walking and/or transfers;Help with stairs or ramp for entrance;Assist for transportation;Assistance with cooking/housework    Equipment Recommendations Other (comment) (defer to rehab facility)  Recommendations for Other Services       Functional Status Assessment Patient has had a recent decline in their  functional status and demonstrates the ability to make significant improvements in function in a reasonable and predictable amount of time.     Precautions / Restrictions Precautions Precautions: Fall Precaution Comments: L neglect Restrictions Weight Bearing Restrictions: No      Mobility  Bed Mobility Overal bed mobility: Needs Assistance Bed Mobility: Supine to Sit     Supine to sit: Mod assist, HOB elevated     General bed mobility comments: Pt is unable to help with LLE progressing toward EOB but with cueing for sequencing pt was able to roll toward the L and push up to sitting with HOB elevated. Patient Response: Flat affect, Cooperative, Impulsive  Transfers Overall transfer level: Needs assistance Equipment used: Rolling walker (2 wheels) Transfers: Sit to/from Stand Sit to Stand: Mod assist           General transfer comment: Pt was able to follow commands for correct sequencing in order to push up from the bed and stand. Pt had difficulty with wanting to impulsively sit. Chair was very close to bed and pt was guided into the recliner by the hips/gait belt due to sitting early    Ambulation/Gait               General Gait Details: Pt was not able to progress ambulation today due to weakness in the LLE. Took steps toward recliner with pt sitting early requiring pt to be guided into recliner due to early fatigue of the LLE and what appears to be possible L sided neglect.      Balance Overall balance assessment: Needs assistance Sitting-balance support: Bilateral upper extremity supported, Feet supported Sitting balance-Leahy Scale: Fair Sitting balance - Comments: Pt is able to improve sitting balance with verbal cues for forward lean to  prevent sliding off seated surface. Postural control: Posterior lean Standing balance support: Single extremity supported, Reliant on assistive device for balance Standing balance-Leahy Scale: Poor Standing balance  comment: Pt L knee initially was doing well holding pt wgt as evidenced by no buckling or hyper extension that then began to buckle with increased time up .                             Pertinent Vitals/Pain Pain Assessment Pain Assessment: Faces Pain Score: 0-No pain    Home Living Family/patient expects to be discharged to:: Private residence Living Arrangements: Children Available Help at Discharge: Family Type of Home: House Home Access: Level entry       Home Layout: One level Home Equipment: None Additional Comments: information was taken from when pt was in the hospital over 1 month ago. Pt daughter spoke with MD and pt lives with son but son works and is not home at all times.    Prior Function Prior Level of Function : Independent/Modified Independent             Mobility Comments: no AD       Hand Dominance        Extremity/Trunk Assessment   Upper Extremity Assessment Upper Extremity Assessment: Defer to OT evaluation    Lower Extremity Assessment Lower Extremity Assessment: LLE deficits/detail LLE Deficits / Details: Difficult to assess the LLE due to possible L neglect. Pt was able to stand on the LLE without significant buckling or hyperextension at the knee. Initially pt was able to lift and progress the LLE slightly when standing but fatigued quickly and pt was unable to progress LLE Sensation: decreased proprioception (difficulty to assess due to what appears to be L sided neglect)    Cervical / Trunk Assessment Cervical / Trunk Assessment: Kyphotic  Communication   Communication: HOH  Cognition Arousal/Alertness: Awake/alert Behavior During Therapy: WFL for tasks assessed/performed Overall Cognitive Status: No family/caregiver present to determine baseline cognitive functioning               General Comments: pt answered some questions correctly and others incorrectly. See OT Eval for more indepth cognitive status                Assessment/Plan    PT Assessment Patient needs continued PT services  PT Problem List Decreased strength;Decreased activity tolerance;Decreased mobility;Decreased cognition;Decreased safety awareness;Impaired sensation;Decreased knowledge of precautions;Decreased knowledge of use of DME;Decreased balance       PT Treatment Interventions DME instruction;Therapeutic activities;Balance training;Stair training;Wheelchair mobility training;Cognitive remediation;Gait training;Functional mobility training;Therapeutic exercise;Neuromuscular re-education;Patient/family education;Manual techniques    PT Goals (Current goals can be found in the Care Plan section)  Acute Rehab PT Goals Patient Stated Goal: pt did not state PT Goal Formulation: With patient Time For Goal Achievement: 03/17/22 Potential to Achieve Goals: Fair    Frequency Min 3X/week        AM-PAC PT "6 Clicks" Mobility  Outcome Measure Help needed turning from your back to your side while in a flat bed without using bedrails?: A Lot Help needed moving from lying on your back to sitting on the side of a flat bed without using bedrails?: A Lot Help needed moving to and from a bed to a chair (including a wheelchair)?: A Lot Help needed standing up from a chair using your arms (e.g., wheelchair or bedside chair)?: A Lot Help needed to walk in hospital room?: Total Help  needed climbing 3-5 steps with a railing? : Total 6 Click Score: 10    End of Session Equipment Utilized During Treatment: Gait belt Activity Tolerance: Patient tolerated treatment well;Patient limited by fatigue Patient left: in chair;with chair alarm set;with call bell/phone within reach Nurse Communication: Mobility status PT Visit Diagnosis: Other abnormalities of gait and mobility (R26.89);Muscle weakness (generalized) (M62.81);Other symptoms and signs involving the nervous system (R29.898);Hemiplegia and hemiparesis Hemiplegia - Right/Left:  Left Hemiplegia - caused by: Cerebral infarction    Time: 1115-5208 PT Time Calculation (min) (ACUTE ONLY): 24 min   Charges:   PT Evaluation $PT Eval Moderate Complexity: 1 Mod PT Treatments $Therapeutic Activity: 8-22 mins        Harrel Carina, DPT, CLT  Acute Rehabilitation Services Office: [phone] (Secure chat preferred)   Claudia Desanctis 03/03/2022, 12:54 PM

## 2022-03-03 NOTE — Progress Notes (Addendum)
STROKE TEAM PROGRESS NOTE   INTERVAL HISTORY Patient was evaluated at bedside. She was alert, but hearing was significantly impaired, limiting the neuro exam. She had notable L sided deficits (see neuro exam)  Vitals:   03/02/22 1940 03/03/22 0041 03/03/22 0433 03/03/22 0834  BP: (!) 154/96 (!) 181/88  (!) 173/85  Pulse: (!) 118 (!) 110  (!) 101  Resp: 18 (!) 22  18  Temp: 97.8 F (36.6 C) 98.9 F (37.2 C) 98.7 F (37.1 C) 98.6 F (37 C)  TempSrc: Oral Oral Oral Oral  SpO2: 98% 99% 98% 96%  Weight:      Height:       CBC:  Recent Labs  Lab 03/02/22 1305  WBC 10.7*  NEUTROABS 7.8*  HGB 16.1*  HCT 46.5*  MCV 86.8  PLT [id number]   Basic Metabolic Panel:  Recent Labs  Lab 03/02/22 1305 03/02/22 1415 03/03/22 0305  NA 142  --  142  K 2.7*  --  3.4*  CL 105  --  109  CO2 23  --  16*  GLUCOSE 125*  --  120*  BUN 25*  --  18  CREATININE 0.82  --  0.65  CALCIUM 10.5*  --  9.5  MG  --  1.9  --    Lipid Panel:  Recent Labs  Lab 03/03/22 0305  CHOL 112  TRIG 67  HDL 36*  CHOLHDL 3.1  VLDL 13  LDLCALC 63   HgbA1c: No results for input(s): "HGBA1C" in the last 168 hours. Urine Drug Screen:  Recent Labs  Lab 03/02/22 1415  LABOPIA NONE DETECTED  COCAINSCRNUR NONE DETECTED  LABBENZ NONE DETECTED  AMPHETMU NONE DETECTED  THCU NONE DETECTED  LABBARB NONE DETECTED    Alcohol Level  Recent Labs  Lab 03/02/22 1305  ETH <10    IMAGING past 24 hours MR BRAIN WO CONTRAST  Result Date: 03/02/2022 CLINICAL DATA:  Acute neurologic deficit EXAM: MRI HEAD WITHOUT CONTRAST TECHNIQUE: Multiplanar, multiecho pulse sequences of the brain and surrounding structures were obtained without intravenous contrast. COMPARISON:  None Available. FINDINGS: Brain: Numerous small areas of acute ischemia throughout the right hemisphere, in multiple vascular territories. There are a few foci of acute ischemia in the left hemisphere, also in multiple vascular territories. The greatest  burden is in the right MCA territory. No acute or chronic hemorrhage. There is confluent hyperintense T2-weighted signal within the white matter. Generalized volume loss. The midline structures are normal. Vascular: Major flow voids are preserved. Skull and upper cervical spine: Normal calvarium and skull base. Visualized upper cervical spine and soft tissues are normal. Sinuses/Orbits:No paranasal sinus fluid levels or advanced mucosal thickening. No mastoid or middle ear effusion. Normal orbits. IMPRESSION: 1. Numerous small areas of acute ischemia throughout the right hemisphere, in multiple vascular territories. The greatest burden is in the right MCA territory. No hemorrhage or mass effect. 2. A few foci of acute ischemia in the left hemisphere, also in multiple vascular territories. 3. Pattern is most consistent with a central embolic source (heart or proximal aorta). Electronically Signed   By: Ulyses Jarred M.D.   On: 03/02/2022 22:13   CT ANGIO HEAD NECK W WO CM W PERF (CODE STROKE)  Result Date: 03/02/2022 CLINICAL DATA:  Stroke code EXAM: CT ANGIOGRAPHY HEAD AND NECK CT PERFUSION BRAIN TECHNIQUE: Multidetector CT imaging of the head and neck was performed using the standard protocol during bolus administration of intravenous contrast. Multiplanar CT image reconstructions and MIPs were  obtained to evaluate the vascular anatomy. Carotid stenosis measurements (when applicable) are obtained utilizing NASCET criteria, using the distal internal carotid diameter as the denominator. Multiphase CT imaging of the brain was performed following IV bolus contrast injection. Subsequent parametric perfusion maps were calculated using RAPID software. RADIATION DOSE REDUCTION: This exam was performed according to the departmental dose-optimization program which includes automated exposure control, adjustment of the mA and/or kV according to patient size and/or use of iterative reconstruction technique. CONTRAST:   OMNIPAQUE IOHEXOL 350 MG/ML SOLN COMPARISON:  Same-day CT brain FINDINGS: CT HEAD FINDINGS Please see same-day CT brain for intracranial findings. No contrast-enhancing lesion is visualized. CTA NECK FINDINGS Aortic arch: Standard branching. Imaged portion shows no evidence of aneurysm or dissection. No significant stenosis of the major arch vessel origins. Right carotid system: No evidence of dissection, stenosis (50% or greater) or occlusion. Mild narrowing of the origin of the right ICA. Left carotid system: No evidence of dissection, stenosis (50% or greater) or occlusion. Mild narrowing of the origin of the left ICA. Vertebral arteries: Left dominant. There is likely mild-to-moderate stenosis of the V4 segment of the left vertebral artery. Skeleton: Multilevel degenerative changes in the cervical spine. No evidence of high-grade spinal canal stenosis. Other neck: None Upper chest: None Review of the MIP images confirms the above findings CTA HEAD FINDINGS Anterior circulation: Mild narrowing of the mid M2 segment of the right MCA (series 10, image 82). No aneurysm. Posterior circulation: Moderate stenosis in the P2 segment of the left PCA (series 10, image 83). Mild-to-moderate narrowing of the origin of the left IJ. Venous sinuses: As permitted by contrast timing, patent. Anatomic variants: None Review of the MIP images confirms the above findings CT Brain Perfusion Findings: ASPECTS: 8 CBF (<30%) Volume: 36mL Perfusion (Tmax>6.0s) volume: 37mL Mismatch Volume: 18mL Infarction Location:None IMPRESSION: 1. No intracranial large vessel occlusion. Moderate stenosis in the P2 segment of the left PCA and mild narrowing of the mid M2 segment of the right MCA. 2. Mild stenosis of the origins of bilateral ICAs. 3. No evidence of infarct core or penumbra on CT perfusion. Electronically Signed   By: Lorenza Cambridge M.D.   On: 03/02/2022 14:25   CT HEAD CODE STROKE WO CONTRAST  Result Date: 03/02/2022 CLINICAL  DATA:  Code stroke.  No lateralizing information available. EXAM: CT HEAD WITHOUT CONTRAST TECHNIQUE: Contiguous axial images were obtained from the base of the skull through the vertex without intravenous contrast. RADIATION DOSE REDUCTION: This exam was performed according to the departmental dose-optimization program which includes automated exposure control, adjustment of the mA and/or kV according to patient size and/or use of iterative reconstruction technique. COMPARISON:  None Available. FINDINGS: Brain: There is an age indeterminate infarct in the right caudate head. Focal hypodensities in the right frontal lobe (series 3, image 15, 16). There is sequela mild chronic microvascular ischemic change. No hemorrhage. No hydrocephalus. No extra-axial fluid collection. Vascular: There is dense calcification at the right M1 M2 junction and along the sylvian and opercular segments of the distal right MCA branches. Skull: Normal. Negative for fracture or focal lesion. Sinuses/Orbits: Right lens replacement. Paranasal sinuses are clear. Other: None ASPECTS (Alberta Stroke Program Early CT Score): 8 - Ganglionic level infarction (caudate, lentiform nuclei, internal capsule, insula, M1-M3 cortex): 6 - Supraganglionic infarction (M4-M6 cortex): 2 IMPRESSION: 1. Age-indeterminate infarcts in the right caudate head and possibly the right frontal opercular region. No hemorrhage. Aspects is 8. 2. Dense calcification at the right M1/M2  junction and along the sylvian and opercular segments of the distal right MCA branches. Findings were discussed with Dr. Tyrone Nine on 03/02/22 at 1:26 PM. Electronically Signed   By: Marin Roberts M.D.   On: 03/02/2022 13:28    PHYSICAL EXAM General: well-appearing and in no acute distress HEENT: normocephalic and atraumatic Cardiovascular: tachycardic and regular rate Respiratory: normal respiratory effort and on RA Gastrointestinal: non-tender and non-distended Extremities: moving all  extremities spontaneously  Mental Status: Deazia L [place] is alert; she is oriented to person. Speech was clear and fluent without evidence of aphasia. She was able to follow only simple commands.  Cranial Nerves: II:  Visual fields  unable to be tested due to hearing issues III,IV, VI: no ptosis, extra-ocular motions intact bilaterally, though has R gaze preference V,VII: smile asymmetric with notable droop on L side which is mild VIII: hearing diminished bilaterally XII: tongue extension deviates to R side without atrophy and without fasciculations  Motor: Right : Upper extremity   5/5 full power  Lower extremity   3/5 full range of motion against gravity  Left: Upper extremity   5/5 full power Lower extremity   3/5 full range of motion against gravity  Tone and bulk: normal tone throughout; no atrophy noted  Sensory: unable to be assessed due to hearing impairment  Gait: not observed during encounter  ASSESSMENT/PLAN Ms. THAMAR ZAPPONE is a 81 y.o. female with history of AS, CAD, HTN who presents with L face and arm weakness, L hemineglect, and impaired naming. Head CT showed possible acute infarct R caudate but deemed beyond intervention window.  Stroke: Acute scattered embolic stroke, etiology unclear, concerning cardioembolic source Code Stroke CT head Age-indeterminate infarcts in the right caudate head and possibly the right frontal opercular region. Dense calcification at the right M1/M2 junction and along the sylvian and opercular segments of the distal right MCA branches. CTA head & neck Moderate stenosis in the P2 segment of the left PCA and mild narrowing of the mid M2 segment of the right MCA. Mild stenosis of the origins of bilateral ICAs.  MRI shows numerous small areas of acute ischemia throughout the right hemisphere, in multiple vascular territories. The greatest burden is in the right MCA territory. A few foci of acute ischemia in the left hemisphere, also in  multiple vascular territories. Pattern is most consistent with a central embolic source (heart or proximal aorta). 2D Echo EF 70 - 75% Vas [place] LE pending LDL 63 HgbA1c pending Consider loop recorder if stroke workup unrevealing VTE prophylaxis - enoxaparin No antithrombotic prior to admission, now on aspirin 81 mg daily and clopidogrel 75 mg daily for 21 days, then aspirin 81 mg alone daily indefinitely Therapy recommendations:  SNF Disposition:  pending medical clearance, stroke workup  Hypertension, chronic Home meds:  amlodipine, losartan. Held on admission Stable Long-term BP goal normotensive  Hyperlipidemia, chronic Home meds:  pravastatin 40 mg, resumed in hospital LDL 63, goal < 70 Continue home pravastatin 40, no high intensity statin given LDL at goal  continue statin at discharge   Other Stroke Risk Factors Advanced Age >/= 54   Other Active Problems to be managed by primary team #Hypokalemia, supplement #Pyuria, UA WBC 21-50, on Rocephin  Hospital day # 1  ATTENDING NOTE: I reviewed above note and agree with the assessment and plan. Pt was seen and examined.   81 year old female with history of CAD, hypertension, aortic stenosis admitted for left-sided facial droop, left arm weakness, left neglect  and anomia.  CT showed right caudate head infarct.  CT head and neck left P2 moderate stenosis.  CT perfusion negative.  MRI showed right cerebellum, bilateral MCA and MCA/ACA watershed area scattered infarcts, right more than left.  EF 70 to 75%.  LE venous Doppler pending.  LDL 63, A1c pending, UDS negative.  Creatinine 0.65, WBC 10.7, UA WBC 21-50.  On exam, no family at bedside.  Patient lying in bed, right gaze preference, however able to track bilaterally.  Severe dysarthria, however orientated to self only.  Paucity of speech, answer question with words, psychomotor slowing, able to name 3/4, able to repeat simple sentence with dysarthric voice and slow reaction.   Blinking to visual threat on the right but not on the left.  Left mild facial droop.  Left upper extremity withdraw to pain 2/5, not against gravity.  Right upper extremity no drift.  Bilateral lower extremity withdraw to pain with right stronger than left. Sensation, coordination and gait not tested.  Etiology for patient stroke not quite clear, concerning for cardioembolic source.  Recommend loop recorder if stroke workup unrevealing.  Currently on aspirin 81 and Plavix 75 DAPT for 3 weeks and then aspirin alone, continue home pravastatin as LDL at goal.  On Rocephin for UTI.  PT/OT pending.  Will follow.  For detailed assessment and plan, please refer to above/below as I have made changes wherever appropriate.   Rosalin Hawking, MD PhD Stroke Neurology 03/03/2022 2:53 PM    To contact Stroke Continuity provider, please refer to [email or web address] After hours, contact General Neurology

## 2022-03-03 NOTE — Progress Notes (Signed)
Lower extremity venous has been completed.   Preliminary results in CV Proc.   Susan Marsh 03/03/2022 3:37 PM

## 2022-03-03 NOTE — NC FL2 (Signed)
MEDICAID FL2 LEVEL OF CARE FORM     IDENTIFICATION  Patient Name: Susan Marsh Birthdate: Jan 08, 1941 Sex: female Admission Date (Current Location): 03/02/2022  [facility] and [place] Number:  Producer, television/film/video and Address:  [facility], [address], [place], [place] [address]      Provider Number: [id number]  Attending Physician Name and Address:  Osvaldo Shipper, MD  Relative Name and Phone Number:       Current Level of Care: Hospital Recommended Level of Care: Skilled Nursing Facility Prior Approval Number:    Date Approved/Denied:   PASRR Number: [id number]  Discharge Plan: SNF    Current Diagnoses: Patient Active Problem List   Diagnosis Date Noted   Acute ischemic stroke (HCC) 03/02/2022   Pyuria 03/02/2022   Hypokalemia 03/02/2022   HTN (hypertension) 03/02/2022   Hyponatremia 01/19/2022    Orientation RESPIRATION BLADDER Height & Weight     Self, Time, Situation, Place  Normal Continent Weight: 62.3 kg Height:  5\' 3"  (160 cm)  BEHAVIORAL SYMPTOMS/MOOD NEUROLOGICAL BOWEL NUTRITION STATUS      Continent Diet (heart healthy with thin liquids)  AMBULATORY STATUS COMMUNICATION OF NEEDS Skin   Extensive Assist Verbally Normal                       Personal Care Assistance Level of Assistance  Bathing, Feeding, Dressing Bathing Assistance: Limited assistance Feeding assistance: Independent Dressing Assistance: Limited assistance     Functional Limitations Info  Sight, Hearing, Speech Sight Info: Adequate Hearing Info: Adequate Speech Info: Impaired (slur)    SPECIAL CARE FACTORS FREQUENCY  PT (By licensed PT), OT (By licensed OT), Speech therapy     PT Frequency: 5x/wk OT Frequency: 5x/wk     Speech Therapy Frequency: 5x/wk      Contractures Contractures Info: Not present    Additional Factors Info  Code Status, Allergies Code Status Info: Full Allergies Info: Alendronate Sodium/  cortisporin otic           Current Medications (03/03/2022):  This is the current hospital active medication list Current Facility-Administered Medications  Medication Dose Route Frequency Provider Last Rate Last Admin   acetaminophen (TYLENOL) tablet 650 mg  650 mg Oral Q4H PRN 14/02/2022, DO       Or   acetaminophen (TYLENOL) 160 MG/5ML solution 650 mg  650 mg Per Tube Q4H PRN Hillary Bow, DO       Or   acetaminophen (TYLENOL) suppository 650 mg  650 mg Rectal Q4H PRN Hillary Bow, DO       aspirin EC tablet 81 mg  81 mg Oral Daily Hillary Bow M, DO   81 mg at 03/03/22 0935   cefTRIAXone (ROCEPHIN) 1 g in sodium chloride 0.9 % 100 mL IVPB  1 g Intravenous Q24H 14/12/23, MD 200 mL/hr at 03/03/22 0942 1 g at 03/03/22 0942   clopidogrel (PLAVIX) tablet 75 mg  75 mg Oral Daily 14/12/23, DO   75 mg at 03/03/22 0935   enoxaparin (LOVENOX) injection 40 mg  40 mg Subcutaneous Q24H 14/12/23 M, DO   40 mg at 03/03/22 0935   melatonin tablet 3 mg  3 mg Oral QHS PRN 14/12/23, DO   3 mg at 03/03/22 0216   metoprolol tartrate (LOPRESSOR) tablet 50 mg  50 mg Oral BID 14/12/23, MD   50 mg at 03/03/22 0935   pravastatin (  PRAVACHOL) tablet 40 mg  40 mg Oral Daily Marvel Plan, MD   40 mg at 03/03/22 0935     Discharge Medications: Please see discharge summary for a list of discharge medications.  Relevant Imaging Results:  Relevant Lab Results:   Additional Information SS#: [id number]  Kermit Balo, RN

## 2022-03-04 LAB — BASIC METABOLIC PANEL
Anion gap: 9 (ref 5–15)
BUN: 28 mg/dL — ABNORMAL HIGH (ref 8–23)
CO2: 21 mmol/L — ABNORMAL LOW (ref 22–32)
Calcium: 9.1 mg/dL (ref 8.9–10.3)
Chloride: 108 mmol/L (ref 98–111)
Creatinine, Ser: 0.82 mg/dL (ref 0.44–1.00)
GFR, Estimated: >60 mL/min (ref >60)
Glucose, Bld: 108 mg/dL — ABNORMAL HIGH (ref 70–99)
Potassium: 3.9 mmol/L (ref 3.5–5.1)
Sodium: 138 mmol/L (ref 135–145)

## 2022-03-04 LAB — CBC
HCT: 37.5 % (ref 36.0–46.0)
Hemoglobin: 13.2 g/dL (ref 12.0–15.0)
MCH: 30.8 pg (ref 26.0–34.0)
MCHC: 35.2 g/dL (ref 30.0–36.0)
MCV: 87.4 fL (ref 80.0–100.0)
Platelets: 229 10*3/uL (ref 150–400)
RBC: 4.29 MIL/uL (ref 3.87–5.11)
RDW: 14.9 % (ref 11.5–15.5)
WBC: 7.5 10*3/uL (ref 4.0–10.5)
nRBC: 0 % (ref 0.0–0.2)

## 2022-03-04 LAB — URINE CULTURE

## 2022-03-04 LAB — MAGNESIUM: Magnesium: 2.2 mg/dL (ref 1.7–2.4)

## 2022-03-04 NOTE — Progress Notes (Signed)
Physical Therapy Treatment Patient Details Name: Susan Marsh MRN: [id number] DOB: 06-Aug-1940 Today's Date: 03/04/2022   History of Present Illness 81 y/o female presented to ED with L sided weakness, L hemineglect. Impaired naming. Family notied arm weakness that was getting worse when they brought her to ED.    PT Comments    Pt demonstrates significant improvement from her initial evaluation yesterday to today's treatment session. Pt requires significantly less assist for supine to sitting and was able to following 1-2 step commands with much greater ease. Pt is very motivated and due to previous level of function as well as current level of function and pt ability to progress significantly recommend skilled physical therapy services in AIR setting following discharge from acute care hospital setting. Pt was able to begin ambulation today requiring facilitation of the LLE but was able to take a step with hip/knee flexion with Mod A assist. Pt will benefit from continued POC in order to decrease risk for falls, injury and to decrease level of physical assist necessary. Pt demonstrates no signs/symptoms of cardiac/respiratory distress throughout session.     Recommendations for follow up therapy are one component of a multi-disciplinary discharge planning process, led by the attending physician.  Recommendations may be updated based on patient status, additional functional criteria and insurance authorization.  Follow Up Recommendations  Acute inpatient rehab (3hours/day) Can patient physically be transported by private vehicle: No   Assistance Recommended at Discharge Frequent or constant Supervision/Assistance  Patient can return home with the following A lot of help with walking and/or transfers;Help with stairs or ramp for entrance;Assist for transportation;Assistance with cooking/housework   Equipment Recommendations  Other (comment) (defer to rehab facility)    Recommendations  for Other Services Rehab consult     Precautions / Restrictions Precautions Precautions: Fall Precaution Comments: L neglect Restrictions Weight Bearing Restrictions: No     Mobility  Bed Mobility Overal bed mobility: Needs Assistance Bed Mobility: Supine to Sit     Supine to sit: Min assist, HOB elevated     General bed mobility comments: Pt is able to perform at Min A with verbal and tactile cues for sequencing today Patient Response: Cooperative  Transfers Overall transfer level: Needs assistance Equipment used: Rolling walker (2 wheels) Transfers: Sit to/from Stand, Bed to chair/wheelchair/BSC Sit to Stand: Min assist   Step pivot transfers: Mod assist       General transfer comment: Pt requires facilitation toward the R in order to clear the LLE from the floor with very low clearance and is only able to do it 2-3x before requiring Mod A at the LLE to help clear the floor with flexion resonse at knee/hip with facilitation of movement for stepping. Pt may benefit from use of hemi walker.    Ambulation/Gait Ambulation/Gait assistance: Mod assist Gait Distance (Feet): 6 Feet Assistive device: Rolling walker (2 wheels) Gait Pattern/deviations: Step-to pattern, Decreased weight shift to right, Decreased stance time - left, Decreased step length - left   Gait velocity interpretation: <1.31 ft/sec, indicative of household ambulator   General Gait Details: Pt was very motivated to progress gait today. She may benefit from [facility] walker. She requires Mod A at the LLE in order to progress forward wtih Mod Facilitation pt then has automatic hip flexion/knee flexion response to relatively normal height step. Requires facilitation for each step and help with wgt shifting toward the R.    Modified Rankin (Stroke Patients Only) Modified Rankin (Stroke Patients Only) Pre-Morbid Rankin Score:  No symptoms Modified Rankin: Severe disability     Balance Overall balance  assessment: Needs assistance Sitting-balance support: Single extremity supported, Feet supported Sitting balance-Leahy Scale: Fair Sitting balance - Comments: Pt did not require physical assistance with sitting EOB today. She has significantly kyphotic posture but was not leaning posteriorly   Standing balance support: Single extremity supported, Reliant on assistive device for balance Standing balance-Leahy Scale: Poor Standing balance comment: Pt requires assistance to Wgt shift toward the R with heavy reliance on AD                            Cognition Arousal/Alertness: Awake/alert Behavior During Therapy: [facility] for tasks assessed/performed Overall Cognitive Status: No family/caregiver present to determine baseline cognitive functioning             General Comments: Per Occupational therapist: Dtr reports she could see some memory problems starting with her mom before this happened. Pt was alert and oriented x4 with OT; some one step commands she followed right away and others took her longer or I had to repeat.               Pertinent Vitals/Pain Pain Assessment Pain Assessment: No/denies pain     PT Goals (current goals can now be found in the care plan section) Acute Rehab PT Goals Patient Stated Goal: pt did not state PT Goal Formulation: With patient Time For Goal Achievement: 03/17/22 Potential to Achieve Goals: Fair Progress towards PT goals: Progressing toward goals    Frequency    Min 3X/week      PT Plan Current plan remains appropriate       AM-PAC PT "6 Clicks" Mobility   Outcome Measure  Help needed turning from your back to your side while in a flat bed without using bedrails?: A Little Help needed moving from lying on your back to sitting on the side of a flat bed without using bedrails?: A Little Help needed moving to and from a bed to a chair (including a wheelchair)?: A Lot Help needed standing up from a chair using your arms  (e.g., wheelchair or bedside chair)?: A Lot Help needed to walk in hospital room?: Total Help needed climbing 3-5 steps with a railing? : Total 6 Click Score: 12    End of Session Equipment Utilized During Treatment: Gait belt Activity Tolerance: Patient tolerated treatment well Patient left: in chair;with chair alarm set;with call bell/phone within reach Nurse Communication: Mobility status PT Visit Diagnosis: Other abnormalities of gait and mobility (R26.89);Muscle weakness (generalized) (M62.81);Other symptoms and signs involving the nervous system (R29.898);Hemiplegia and hemiparesis Hemiplegia - Right/Left: Left Hemiplegia - caused by: Cerebral infarction     Time: [id number] PT Time Calculation (min) (ACUTE ONLY): 26 min  Charges:  $Gait Training: 8-22 mins $Therapeutic Activity: 8-22 mins                     Tomma Rakers, DPT, [place] Office: [phone] (Secure chat preferred)    Ander Purpura 03/04/2022, 4:11 PM

## 2022-03-04 NOTE — Plan of Care (Signed)

## 2022-03-04 NOTE — Progress Notes (Addendum)
STROKE TEAM PROGRESS NOTE   INTERVAL HISTORY Patient was evaluated at bedside. She is alert, oriented x4. She continues to have notable L sided deficits but improved from yesterday (see neuro exam)  Vitals:   03/03/22 1634 03/03/22 2011 03/03/22 2343 03/04/22 0403  BP: (!) 165/83 (!) 167/92 (!) 137/95 (!) 160/88  Pulse: 94 99 80 78  Resp: 16 20 19 17   Temp: 98.8 F (37.1 C) 98.3 F (36.8 C) 97.8 F (36.6 C) 98.4 F (36.9 C)  TempSrc: Oral Oral Oral Oral  SpO2: 98% 98% 99% 98%  Weight:      Height:       CBC:  Recent Labs  Lab 03/02/22 1305 03/04/22 0359  WBC 10.7* 7.5  NEUTROABS 7.8*  --   HGB 16.1* 13.2  HCT 46.5* 37.5  MCV 86.8 87.4  PLT 305 [id number]    Basic Metabolic Panel:  Recent Labs  Lab 03/02/22 1415 03/03/22 0305 03/04/22 0359  NA  --  142 138  K  --  3.4* 3.9  CL  --  109 108  CO2  --  16* 21*  GLUCOSE  --  120* 108*  BUN  --  18 28*  CREATININE  --  0.65 0.82  CALCIUM  --  9.5 9.1  MG 1.9  --  2.2    Lipid Panel:  Recent Labs  Lab 03/03/22 0305  CHOL 112  TRIG 67  HDL 36*  CHOLHDL 3.1  VLDL 13  LDLCALC 63    HgbA1c:  Recent Labs  Lab 03/02/22 2127  HGBA1C 5.9*   Urine Drug Screen:  Recent Labs  Lab 03/02/22 1415  LABOPIA NONE DETECTED  COCAINSCRNUR NONE DETECTED  LABBENZ NONE DETECTED  AMPHETMU NONE DETECTED  THCU NONE DETECTED  LABBARB NONE DETECTED     Alcohol Level  Recent Labs  Lab 03/02/22 1305  ETH <10     IMAGING past 24 hours VAS [place] LOWER EXTREMITY VENOUS (DVT)  Result Date: 03/03/2022  Lower Venous DVT Study Patient Name:  MONETA MCCULLEY  Date of Exam:   03/03/2022 Medical Rec #: [id number]          Accession #:    [id number] Date of Birth: Aug 11, 1940         Patient Gender: F Patient Age:   81 years Exam Location:  [facility] Procedure:      VAS [place] LOWER EXTREMITY VENOUS (DVT) Referring Phys: Cornelius Moras Blythe Hartshorn --------------------------------------------------------------------------------  Indications:  Stroke.  Comparison Study: no prior Performing Technologist: Archie Patten RVS  Examination Guidelines: A complete evaluation includes B-mode imaging, spectral Doppler, color Doppler, and power Doppler as needed of all accessible portions of each vessel. Bilateral testing is considered an integral part of a complete examination. Limited examinations for reoccurring indications may be performed as noted. The reflux portion of the exam is performed with the patient in reverse Trendelenburg.  +---------+---------------+---------+-----------+----------+--------------+ RIGHT    CompressibilityPhasicitySpontaneityPropertiesThrombus Aging +---------+---------------+---------+-----------+----------+--------------+ CFV      Full           Yes      Yes                                 +---------+---------------+---------+-----------+----------+--------------+ SFJ      Full                                                        +---------+---------------+---------+-----------+----------+--------------+  FV Prox  Full                                                        +---------+---------------+---------+-----------+----------+--------------+ FV Mid   Full                                                        +---------+---------------+---------+-----------+----------+--------------+ FV DistalFull                                                        +---------+---------------+---------+-----------+----------+--------------+ PFV      Full                                                        +---------+---------------+---------+-----------+----------+--------------+ POP      Full           Yes      Yes                                 +---------+---------------+---------+-----------+----------+--------------+ PTV      Full                                                        +---------+---------------+---------+-----------+----------+--------------+ PERO      Full                                                        +---------+---------------+---------+-----------+----------+--------------+   +---------+---------------+---------+-----------+----------+--------------+ LEFT     CompressibilityPhasicitySpontaneityPropertiesThrombus Aging +---------+---------------+---------+-----------+----------+--------------+ CFV      Full           Yes      Yes                                 +---------+---------------+---------+-----------+----------+--------------+ SFJ      Full                                                        +---------+---------------+---------+-----------+----------+--------------+ FV Prox  Full                                                        +---------+---------------+---------+-----------+----------+--------------+  FV Mid   Full                                                        +---------+---------------+---------+-----------+----------+--------------+ FV DistalFull                                                        +---------+---------------+---------+-----------+----------+--------------+ PFV      Full                                                        +---------+---------------+---------+-----------+----------+--------------+ POP      Full           Yes      Yes                                 +---------+---------------+---------+-----------+----------+--------------+ PTV      Full                                                        +---------+---------------+---------+-----------+----------+--------------+ PERO     Full                                                        +---------+---------------+---------+-----------+----------+--------------+     Summary: BILATERAL: - No evidence of deep vein thrombosis seen in the lower extremities, bilaterally. -No evidence of popliteal cyst, bilaterally.   *See table(s) above for measurements and observations.  Electronically signed by Harold Barban MD on 03/03/2022 at 11:27:50 PM.    Final    ECHOCARDIOGRAM COMPLETE  Result Date: 03/03/2022    ECHOCARDIOGRAM REPORT   Patient Name:   THRESIA LANZA Date of Exam: 03/03/2022 Medical Rec #:  [id number]         Height:       63.0 in Accession #:    [id number]        Weight:       137.3 lb Date of Birth:  04-24-1940        BSA:          1.648 m Patient Age:    20 years          BP:           173/85 mmHg Patient Gender: F                 HR:           98 bpm. Exam Location:  Inpatient Procedure: 2D Echo, Color Doppler, Cardiac Doppler and Intracardiac            Opacification Agent Indications:  Stroke I63.9  History:        Patient has no prior history of Echocardiogram examinations.                 Risk Factors:Hypertension and Dyslipidemia.  Sonographer:    Leta Jungling RDCS Referring Phys: [id number] JARED M GARDNER IMPRESSIONS  1. Left ventricular ejection fraction, by estimation, is 70 to 75%. The left ventricle has hyperdynamic function. The left ventricle has no regional wall motion abnormalities. 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 Indeterminate diastolic filling due to E-A fusion. Small intracavitary gradient (14 mm Hg at peak) without LVOT obstruction.  2. Right ventricular systolic function is normal. The right ventricular size is normal.  3. The mitral valve is normal in structure. No evidence of mitral valve regurgitation. No evidence of mitral stenosis.  4. The aortic valve was not well visualized. Aortic valve regurgitation is mild. No aortic stenosis is present. Comparison(s): No prior Echocardiogram. FINDINGS  Left Ventricle: Left ventricular ejection fraction, by estimation, is 70 to 75%. The left ventricle has hyperdynamic function. The left ventricle has no regional wall motion abnormalities. Definity contrast agent was given IV to delineate the left ventricular endocardial borders. The left ventricular  internal cavity size was normal in size. 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 Indeterminate diastolic filling due to E-A fusion. Right Ventricle: The right ventricular size is normal. No increase in right ventricular wall thickness. Right ventricular systolic function is normal. Left Atrium: Left atrial size was normal in size. Right Atrium: Right atrial size was normal in size. Pericardium: Trivial pericardial effusion is present. The pericardial effusion is anterior to the right ventricle. Presence of epicardial fat layer. Mitral Valve: The mitral valve is normal in structure. No evidence of mitral valve regurgitation. No evidence of mitral valve stenosis. Tricuspid Valve: The tricuspid valve is normal in structure. Tricuspid valve regurgitation is not demonstrated. No evidence of tricuspid stenosis. Aortic Valve: The aortic valve was not well visualized. Aortic valve regurgitation is mild. No aortic stenosis is present. Pulmonic Valve: The pulmonic valve was not well visualized. Pulmonic valve regurgitation is not visualized. No evidence of pulmonic stenosis. Aorta: The aortic root is normal in size and structure. IAS/Shunts: No atrial level shunt detected by color flow Doppler.  LEFT VENTRICLE PLAX 2D LVIDd:         2.81 cm LVIDs:         1.90 cm LV PW:         1.15 cm LV IVS:        1.87 cm LVOT diam:     2.20 cm LV SV:         62 LV SV Index:   38 LVOT Area:     3.80 cm  LV Volumes (MOD) LV vol d, MOD A2C: 48.0 ml LV vol d, MOD A4C: 83.4 ml LV vol s, MOD A2C: 14.0 ml LV vol s, MOD A4C: 29.2 ml LV SV MOD A2C:     34.0 ml LV SV MOD A4C:     83.4 ml LV SV MOD BP:      42.9 ml RIGHT VENTRICLE TAPSE (M-mode): 1.7 cm LEFT ATRIUM             Index LA diam:        3.40 cm 2.06 cm/m LA Vol (A2C):   35.0 ml 21.24 ml/m LA Vol (A4C):   35.5 ml 21.54 ml/m LA Biplane Vol: 36.5 ml  22.15 ml/m  AORTIC VALVE LVOT Vmax:   111.00 cm/s LVOT Vmean:  76.700 cm/s LVOT VTI:    0.164  m  AORTA Ao Root diam: 3.00 cm TRICUSPID VALVE TR Peak grad:   9.0 mmHg TR Vmax:        150.00 cm/s  SHUNTS Systemic VTI:  0.16 m Systemic Diam: 2.20 cm Rudean Haskell MD Electronically signed by Rudean Haskell MD Signature Date/Time: 03/03/2022/12:38:27 PM    Final     PHYSICAL EXAM General: well-appearing and in no acute distress HEENT: normocephalic and atraumatic Cardiovascular: tachycardic and regular rate Respiratory: normal respiratory effort and on RA Gastrointestinal: non-tender and non-distended Extremities: moving all extremities spontaneously  Mental Status: Syble L Hagan is alert; she is oriented to person, place, time, and situation. Speech was clear and fluent without evidence of aphasia. She was able to follow 3 step commands but with difficulty.  Cranial Nerves: II:  Visual fields  unable to be tested due to hearing issues III,IV, VI: no ptosis, extra-ocular motions intact bilaterally V,VII: smile asymmetric with notable droop on L side which is mild VIII: hearing diminished bilaterally XII: midline tongue extension without atrophy and without fasciculations  Motor: Right : Upper extremity   5/5 full power  Lower extremity   3/5 full range of motion against gravity  Left: Upper extremity   1/5 flicker of contraction  Lower extremity   4/5 full range of motion against gravity and offers some resistance  Tone and bulk: normal tone throughout; no atrophy noted  Sensory: intact in bilateral UE and LE  Gait: not observed during encounter  ASSESSMENT/PLAN Ms. LAYLONNIE NOLLE is a 81 y.o. female with history of AS, CAD, HTN who presents with L face and arm weakness, L hemineglect, and impaired naming. Head CT showed possible acute infarct R caudate but deemed beyond intervention window.  Stroke: Acute scattered embolic stroke, etiology unclear, concerning cardioembolic source Code Stroke CT head Age-indeterminate infarcts in the right caudate head  and possibly the right frontal opercular region. Dense calcification at the right M1/M2 junction and along the sylvian and opercular segments of the distal right MCA branches. CTA head & neck Moderate stenosis in the P2 segment of the left PCA and mild narrowing of the mid M2 segment of the right MCA. Mild stenosis of the origins of bilateral ICAs.  MRI shows numerous small areas of acute ischemia throughout the right hemisphere, in multiple vascular territories. The greatest burden is in the right MCA territory. A few foci of acute ischemia in the left hemisphere, also in multiple vascular territories. Pattern is most consistent with a central embolic source (heart or proximal aorta). 2D Echo EF 70 - 75% Vas [place] LE no DVT LDL 63 HgbA1c 5.9 Recommend Loop recorder on discharge VTE prophylaxis - enoxaparin No antithrombotic prior to admission, now on aspirin 81 mg daily and clopidogrel 75 mg daily for 21 days, then aspirin 81 mg alone daily indefinitely Therapy recommendations:  SNF Disposition:  pending  Hypertension, chronic Home meds:  amlodipine, losartan  Stable Long-term BP goal normotensive  Hyperlipidemia, chronic Home meds:  pravastatin 40 mg, resumed in hospital LDL 63, goal < 70 Continue home pravastatin 40, no high intensity statin given LDL at goal  continue statin at discharge  Prediabetes, chronic Home meds:  none A1c 5.9. Goal < 7.0  Other Stroke Risk Factors Advanced Age >/= 40   Other Active Problems to be managed by primary team #Hypokalemia, supplement #Pyuria, UA WBC 21-50,  on Rocephin  Hospital day # 2  ATTENDING NOTE: I reviewed above note and agree with the assessment and plan. Pt was seen and examined.   No family at bedside.  Patient lying in bed, more awake alert, eyes open, orientated to self, age, time and place.  Still has hard of hearing and mild dysarthria.  Left upper extremity 2+/5 improved from yesterday.  Left lower extremity 3 -/5 improved  from yesterday.  On DAPT and statin, recommend loop recorder prior to discharge.  EP aware.  PT therapy recommend SNF.  For detailed assessment and plan, please refer to above/below as I have made changes wherever appropriate.   Neurology will sign off. Please call with questions. Pt will follow up with Dr. April Manson on 04/08/2022 at [facility] as scheduled. Thanks for the consult.   Rosalin Hawking, MD PhD Stroke Neurology 03/04/2022 6:15 PM    To contact Stroke Continuity provider, please refer to [email or web address] After hours, contact General Neurology

## 2022-03-04 NOTE — TOC Progression Note (Addendum)
Transition of Care [facility]) - Progression Note    Patient Details  Name: Susan Marsh MRN: [id number] Date of Birth: 07-24-1940  [facility]) CM/SW Contact  Kermit Balo, RN Phone Number: 03/04/2022, 9:48 AM  Clinical Narrative:    Attempted to send facilities for SNF rehab to daughter in email yesterday but would not send. CM spoke with her this am and sent them to her work email with offers and denials. Daughter to update CM later today.  Will need authorization after bed selected.  TOC following.  1600: daughter had selected [facility] and received confirmation from [facility] and insurance auth obtained, but then they have rescinded their offer. CM updated the daughter and she selected [facility] but prefers CIR if able. CM inquired with PT and they are in agreement. Will have CIR look at her and see if they will open insurance. [facility] will have a bed on Friday if CIR is not an option.   Expected Discharge Plan: Skilled Nursing Facility Barriers to Discharge: Continued Medical Work up  Expected Discharge Plan and Services Expected Discharge Plan: Skilled Nursing Facility In-house Referral: Clinical Social Work Discharge Planning Services: CM Consult Post Acute Care Choice: Skilled Nursing Facility Living arrangements for the past 2 months: Single Family Home                                       Social Determinants of Health (SDOH) Interventions    Readmission Risk Interventions     No data to display

## 2022-03-04 NOTE — Progress Notes (Signed)
[facility] PROGRESS NOTE   Susan Marsh BJS:[id number] DOB: [birth year] DOA: 03/02/2022  PCP: [name], MD  Brief History/Interval Summary: 81 y.o. female with medical history significant of HTN, HLD, cad S/P stent, aortic stenosis. Pt last admitted from 10/29-11/3 with erratic behavior, visual hallucinations, and poor PO intake that was thought to be related to AKI, dehydration, and hyponatremia. At baseline pt fully functional, lives independently, and does all ADLs.  Presented to the ED with left-sided weakness.  Found to have acute stroke.  Hospitalized for further management.  Consultants: Neurology  Procedures: Transthoracic echocardiogram  Subjective/Interval History: No acute issues or events overnight denies nausea vomiting diarrhea constipation headache fevers chills or chest pain  Assessment/Plan:  Acute ischemic stroke Patient with left-sided weakness. MRI shows numerous small areas of acute ischemia in the right hemisphere and also with the left hemisphere. Patient underwent CT angiogram head and neck -unremarkable for vascular abnormalities LDL is 63.  Continue statin A1c 5.6 PT OT SLP evaluation. Continue aspirin indefinitely, Plavix x 21 days Neuro recommending loop recorder, cardiology/EP aware  Questionable urinary tract infection Complains of discomfort with urinating.  UA noted to be abnormal.  Follow-up on urine culture.  Start ceftriaxone for now.  Hypokalemia Within normal limits, magnesium 2.2  SVT Beta-blocker resumed, no further episodes  Essential hypertension Currently allowing permissive hypertension. Restart amlodipine losartan and metoprolol prior to discharge as indicated  Metabolic acidosis with mildly elevated anion gap Resolving, follow repeat labs  DVT Prophylaxis: Lovenox Code Status: Full code Family Communication: Discussed with patient.  No family at bedside Disposition Plan: To be determined  Status is:  Inpatient Remains inpatient appropriate because: Acute stroke  Medications: Scheduled:  aspirin EC  81 mg Oral Daily   clopidogrel  75 mg Oral Daily   enoxaparin (LOVENOX) injection  40 mg Subcutaneous Q24H   metoprolol tartrate  50 mg Oral BID   pravastatin  40 mg Oral Daily   Continuous:  cefTRIAXone (ROCEPHIN)  IV Stopped (03/03/22 1036)   YWV:PXTGGYIRSWNIO **OR** acetaminophen (TYLENOL) oral liquid 160 mg/5 mL **OR** acetaminophen, melatonin  Antibiotics: Anti-infectives (From admission, onward)    Start     Dose/Rate Route Frequency Ordered Stop   03/03/22 0945  cefTRIAXone (ROCEPHIN) 1 g in sodium chloride 0.9 % 100 mL IVPB        1 g 200 mL/hr over 30 Minutes Intravenous Every 24 hours 03/03/22 0849        Objective:  Vital Signs  Vitals:   03/03/22 1634 03/03/22 2011 03/03/22 2343 03/04/22 0403  BP: (!) 165/83 (!) 167/92 (!) 137/95 (!) 160/88  Pulse: 94 99 80 78  Resp: 16 20 19 17   Temp: 98.8 F (37.1 C) 98.3 F (36.8 C) 97.8 F (36.6 C) 98.4 F (36.9 C)  TempSrc: Oral Oral Oral Oral  SpO2: 98% 98% 99% 98%  Weight:      Height:        Intake/Output Summary (Last 24 hours) at 03/04/2022 0739 Last data filed at 03/04/2022 0408 Gross per 24 hour  Intake 100 ml  Output 1025 ml  Net -925 ml    Filed Weights   03/02/22 1252  Weight: 62.3 kg    General appearance: Awake alert.  In no distress Resp: Clear to auscultation bilaterally.  Normal effort Cardio: S1-S2 is normal regular.  No S3-S4.  No rubs murmurs or bruit GI: Abdomen is soft.  Nontender nondistended.  Bowel sounds are present normal.  No masses organomegaly Extremities: No  edema.   Neurologic: Left hemiparesis noted  Lab Results:  Data Reviewed: I have personally reviewed following labs and reports of the imaging studies  CBC: Recent Labs  Lab 03/02/22 1305 03/04/22 0359  WBC 10.7* 7.5  NEUTROABS 7.8*  --   HGB 16.1* 13.2  HCT 46.5* 37.5  MCV 86.8 87.4  PLT 305 229      Basic Metabolic Panel: Recent Labs  Lab 03/02/22 1305 03/02/22 1415 03/03/22 0305 03/04/22 0359  NA 142  --  142 138  K 2.7*  --  3.4* 3.9  CL 105  --  109 108  CO2 23  --  16* 21*  GLUCOSE 125*  --  120* 108*  BUN 25*  --  18 28*  CREATININE 0.82  --  0.65 0.82  CALCIUM 10.5*  --  9.5 9.1  MG  --  1.9  --  2.2     GFR: Estimated Creatinine Clearance: 45.3 mL/min (by C-G formula based on SCr of 0.82 mg/dL).  Liver Function Tests: Recent Labs  Lab 03/02/22 1305  AST 20  ALT 28  ALKPHOS 67  BILITOT 1.1  PROT 9.4*  ALBUMIN 5.2*      Coagulation Profile: Recent Labs  Lab 03/02/22 1305  INR 1.0      CBG: Recent Labs  Lab 03/02/22 1339 03/03/22 1217 03/03/22 1736 03/03/22 2143  GLUCAP 101* 108* 103* 110*     Lipid Profile: Recent Labs    03/03/22 0305  CHOL 112  HDL 36*  LDLCALC 63  TRIG 67  CHOLHDL 3.1     Radiology Studies: VAS [place] LOWER EXTREMITY VENOUS (DVT)  Result Date: 03/03/2022  Lower Venous DVT Study Patient Name:  Susan Marsh  Date of Exam:   03/03/2022 Medical Rec #: [id number]          Accession #:    [id number] Date of Birth: January 31, 1941         Patient Gender: F Patient Age:   47 years Exam Location:  [facility] Procedure:      VAS [place] LOWER EXTREMITY VENOUS (DVT) Referring Phys: Scheryl Marten XU --------------------------------------------------------------------------------  Indications: Stroke.  Comparison Study: no prior Performing Technologist: [place] Ponder RVS  Examination Guidelines: A complete evaluation includes B-mode imaging, spectral Doppler, color Doppler, and power Doppler as needed of all accessible portions of each vessel. Bilateral testing is considered an integral part of a complete examination. Limited examinations for reoccurring indications may be performed as noted. The reflux portion of the exam is performed with the patient in reverse Trendelenburg.   +---------+---------------+---------+-----------+----------+--------------+ RIGHT    CompressibilityPhasicitySpontaneityPropertiesThrombus Aging +---------+---------------+---------+-----------+----------+--------------+ CFV      Full           Yes      Yes                                 +---------+---------------+---------+-----------+----------+--------------+ SFJ      Full                                                        +---------+---------------+---------+-----------+----------+--------------+ FV Prox  Full                                                        +---------+---------------+---------+-----------+----------+--------------+  FV Mid   Full                                                        +---------+---------------+---------+-----------+----------+--------------+ FV DistalFull                                                        +---------+---------------+---------+-----------+----------+--------------+ PFV      Full                                                        +---------+---------------+---------+-----------+----------+--------------+ POP      Full           Yes      Yes                                 +---------+---------------+---------+-----------+----------+--------------+ PTV      Full                                                        +---------+---------------+---------+-----------+----------+--------------+ PERO     Full                                                        +---------+---------------+---------+-----------+----------+--------------+   +---------+---------------+---------+-----------+----------+--------------+ LEFT     CompressibilityPhasicitySpontaneityPropertiesThrombus Aging +---------+---------------+---------+-----------+----------+--------------+ CFV      Full           Yes      Yes                                  +---------+---------------+---------+-----------+----------+--------------+ SFJ      Full                                                        +---------+---------------+---------+-----------+----------+--------------+ FV Prox  Full                                                        +---------+---------------+---------+-----------+----------+--------------+ FV Mid   Full                                                        +---------+---------------+---------+-----------+----------+--------------+  FV DistalFull                                                        +---------+---------------+---------+-----------+----------+--------------+ PFV      Full                                                        +---------+---------------+---------+-----------+----------+--------------+ POP      Full           Yes      Yes                                 +---------+---------------+---------+-----------+----------+--------------+ PTV      Full                                                        +---------+---------------+---------+-----------+----------+--------------+ PERO     Full                                                        +---------+---------------+---------+-----------+----------+--------------+     Summary: BILATERAL: - No evidence of deep vein thrombosis seen in the lower extremities, bilaterally. -No evidence of popliteal cyst, bilaterally.   *See table(s) above for measurements and observations. Electronically signed by Coral Else MD on 03/03/2022 at 11:27:50 PM.    Final    ECHOCARDIOGRAM COMPLETE  Result Date: 03/03/2022    ECHOCARDIOGRAM REPORT   Patient Name:   CATHERYNE DEFORD Date of Exam: 03/03/2022 Medical Rec #:  [id number]         Height:       63.0 in Accession #:    [id number]        Weight:       137.3 lb Date of Birth:  1941/01/29        BSA:          1.648 m Patient Age:    80 years          BP:           173/85  mmHg Patient Gender: F                 HR:           98 bpm. Exam Location:  Inpatient Procedure: 2D Echo, Color Doppler, Cardiac Doppler and Intracardiac            Opacification Agent Indications:    Stroke I63.9  History:        Patient has no prior history of Echocardiogram examinations.                 Risk Factors:Hypertension and Dyslipidemia.  Sonographer:    Leta Jungling RDCS Referring Phys: [phone] JARED M GARDNER IMPRESSIONS  1. Left ventricular ejection fraction, by  estimation, is 70 to 75%. The left ventricle has hyperdynamic function. The left ventricle has no regional wall motion abnormalities. 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 Indeterminate diastolic filling due to E-A fusion. Small intracavitary gradient (14 mm Hg at peak) without LVOT obstruction.  2. Right ventricular systolic function is normal. The right ventricular size is normal.  3. The mitral valve is normal in structure. No evidence of mitral valve regurgitation. No evidence of mitral stenosis.  4. The aortic valve was not well visualized. Aortic valve regurgitation is mild. No aortic stenosis is present. Comparison(s): No prior Echocardiogram. FINDINGS  Left Ventricle: Left ventricular ejection fraction, by estimation, is 70 to 75%. The left ventricle has hyperdynamic function. The left ventricle has no regional wall motion abnormalities. Definity contrast agent was given IV to delineate the left ventricular endocardial borders. The left ventricular internal cavity size was normal in size. 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 Indeterminate diastolic filling due to E-A fusion. Right Ventricle: The right ventricular size is normal. No increase in right ventricular wall thickness. Right ventricular systolic function is normal. Left Atrium: Left atrial size was normal in size. Right Atrium: Right atrial size was normal in size. Pericardium:  Trivial pericardial effusion is present. The pericardial effusion is anterior to the right ventricle. Presence of epicardial fat layer. Mitral Valve: The mitral valve is normal in structure. No evidence of mitral valve regurgitation. No evidence of mitral valve stenosis. Tricuspid Valve: The tricuspid valve is normal in structure. Tricuspid valve regurgitation is not demonstrated. No evidence of tricuspid stenosis. Aortic Valve: The aortic valve was not well visualized. Aortic valve regurgitation is mild. No aortic stenosis is present. Pulmonic Valve: The pulmonic valve was not well visualized. Pulmonic valve regurgitation is not visualized. No evidence of pulmonic stenosis. Aorta: The aortic root is normal in size and structure. IAS/Shunts: No atrial level shunt detected by color flow Doppler.  LEFT VENTRICLE PLAX 2D LVIDd:         2.81 cm LVIDs:         1.90 cm LV PW:         1.15 cm LV IVS:        1.87 cm LVOT diam:     2.20 cm LV SV:         62 LV SV Index:   38 LVOT Area:     3.80 cm  LV Volumes (MOD) LV vol d, MOD A2C: 48.0 ml LV vol d, MOD A4C: 83.4 ml LV vol s, MOD A2C: 14.0 ml LV vol s, MOD A4C: 29.2 ml LV SV MOD A2C:     34.0 ml LV SV MOD A4C:     83.4 ml LV SV MOD BP:      42.9 ml RIGHT VENTRICLE TAPSE (M-mode): 1.7 cm LEFT ATRIUM             Index LA diam:        3.40 cm 2.06 cm/m LA Vol (A2C):   35.0 ml 21.24 ml/m LA Vol (A4C):   35.5 ml 21.54 ml/m LA Biplane Vol: 36.5 ml 22.15 ml/m  AORTIC VALVE LVOT Vmax:   111.00 cm/s LVOT Vmean:  76.700 cm/s LVOT VTI:    0.164 m  AORTA Ao Root diam: 3.00 cm TRICUSPID VALVE TR Peak grad:   9.0 mmHg TR Vmax:        150.00 cm/s  SHUNTS Systemic VTI:  0.16 m Systemic Diam: 2.20 cm  Riley LamMahesh Chandrasekhar MD Electronically signed by Riley LamMahesh Chandrasekhar MD Signature Date/Time: 03/03/2022/12:38:27 PM    Final    MR BRAIN WO CONTRAST  Result Date: 03/02/2022 CLINICAL DATA:  Acute neurologic deficit EXAM: MRI HEAD WITHOUT CONTRAST TECHNIQUE: Multiplanar, multiecho  pulse sequences of the brain and surrounding structures were obtained without intravenous contrast. COMPARISON:  None Available. FINDINGS: Brain: Numerous small areas of acute ischemia throughout the right hemisphere, in multiple vascular territories. There are a few foci of acute ischemia in the left hemisphere, also in multiple vascular territories. The greatest burden is in the right MCA territory. No acute or chronic hemorrhage. There is confluent hyperintense T2-weighted signal within the white matter. Generalized volume loss. The midline structures are normal. Vascular: Major flow voids are preserved. Skull and upper cervical spine: Normal calvarium and skull base. Visualized upper cervical spine and soft tissues are normal. Sinuses/Orbits:No paranasal sinus fluid levels or advanced mucosal thickening. No mastoid or middle ear effusion. Normal orbits. IMPRESSION: 1. Numerous small areas of acute ischemia throughout the right hemisphere, in multiple vascular territories. The greatest burden is in the right MCA territory. No hemorrhage or mass effect. 2. A few foci of acute ischemia in the left hemisphere, also in multiple vascular territories. 3. Pattern is most consistent with a central embolic source (heart or proximal aorta). Electronically Signed   By: Deatra RobinsonKevin  Herman M.D.   On: 03/02/2022 22:13   CT ANGIO HEAD NECK W WO CM W PERF (CODE STROKE)  Result Date: 03/02/2022 CLINICAL DATA:  Stroke code EXAM: CT ANGIOGRAPHY HEAD AND NECK CT PERFUSION BRAIN TECHNIQUE: Multidetector CT imaging of the head and neck was performed using the standard protocol during bolus administration of intravenous contrast. Multiplanar CT image reconstructions and MIPs were obtained to evaluate the vascular anatomy. Carotid stenosis measurements (when applicable) are obtained utilizing NASCET criteria, using the distal internal carotid diameter as the denominator. Multiphase CT imaging of the brain was performed following IV  bolus contrast injection. Subsequent parametric perfusion maps were calculated using RAPID software. RADIATION DOSE REDUCTION: This exam was performed according to the departmental dose-optimization program which includes automated exposure control, adjustment of the mA and/or kV according to patient size and/or use of iterative reconstruction technique. CONTRAST:  100mL OMNIPAQUE IOHEXOL 350 MG/ML SOLN COMPARISON:  Same-day CT brain FINDINGS: CT HEAD FINDINGS Please see same-day CT brain for intracranial findings. No contrast-enhancing lesion is visualized. CTA NECK FINDINGS Aortic arch: Standard branching. Imaged portion shows no evidence of aneurysm or dissection. No significant stenosis of the major arch vessel origins. Right carotid system: No evidence of dissection, stenosis (50% or greater) or occlusion. Mild narrowing of the origin of the right ICA. Left carotid system: No evidence of dissection, stenosis (50% or greater) or occlusion. Mild narrowing of the origin of the left ICA. Vertebral arteries: Left dominant. There is likely mild-to-moderate stenosis of the V4 segment of the left vertebral artery. Skeleton: Multilevel degenerative changes in the cervical spine. No evidence of high-grade spinal canal stenosis. Other neck: None Upper chest: None Review of the MIP images confirms the above findings CTA HEAD FINDINGS Anterior circulation: Mild narrowing of the mid M2 segment of the right MCA (series 10, image 82). No aneurysm. Posterior circulation: Moderate stenosis in the P2 segment of the left PCA (series 10, image 83). Mild-to-moderate narrowing of the origin of the left IJ. Venous sinuses: As permitted by contrast timing, patent. Anatomic variants: None Review of the MIP images confirms the above findings CT Brain Perfusion Findings:  ASPECTS: 8 CBF (<30%) Volume: 65mL Perfusion (Tmax>6.0s) volume: 76mL Mismatch Volume: 50mL Infarction Location:None IMPRESSION: 1. No intracranial large vessel occlusion.  Moderate stenosis in the P2 segment of the left PCA and mild narrowing of the mid M2 segment of the right MCA. 2. Mild stenosis of the origins of bilateral ICAs. 3. No evidence of infarct core or penumbra on CT perfusion. Electronically Signed   By: Lorenza Cambridge M.D.   On: 03/02/2022 14:25   CT HEAD CODE STROKE WO CONTRAST  Result Date: 03/02/2022 CLINICAL DATA:  Code stroke.  No lateralizing information available. EXAM: CT HEAD WITHOUT CONTRAST TECHNIQUE: Contiguous axial images were obtained from the base of the skull through the vertex without intravenous contrast. RADIATION DOSE REDUCTION: This exam was performed according to the departmental dose-optimization program which includes automated exposure control, adjustment of the mA and/or kV according to patient size and/or use of iterative reconstruction technique. COMPARISON:  None Available. FINDINGS: Brain: There is an age indeterminate infarct in the right caudate head. Focal hypodensities in the right frontal lobe (series 3, image 15, 16). There is sequela mild chronic microvascular ischemic change. No hemorrhage. No hydrocephalus. No extra-axial fluid collection. Vascular: There is dense calcification at the right M1 M2 junction and along the sylvian and opercular segments of the distal right MCA branches. Skull: Normal. Negative for fracture or focal lesion. Sinuses/Orbits: Right lens replacement. Paranasal sinuses are clear. Other: None ASPECTS ([place]berta Stroke Program Early CT Score): 8 - Ganglionic level infarction (caudate, lentiform nuclei, internal capsule, insula, M1-M3 cortex): 6 - Supraganglionic infarction (M4-M6 cortex): 2 IMPRESSION: 1. Age-indeterminate infarcts in the right caudate head and possibly the right frontal opercular region. No hemorrhage. Aspects is 8. 2. Dense calcification at the right M1/M2 junction and along the sylvian and opercular segments of the distal right MCA branches. Findings were discussed with Dr. Adela Lank on  03/02/22 at 1:26 PM. Electronically Signed   By: Lorenza Cambridge M.D.   On: 03/02/2022 13:28       LOS: 2 days   Azucena Fallen  [facility] Pager on [email or web address]  03/04/2022, 7:39 AM

## 2022-03-04 NOTE — Progress Notes (Signed)
  Inpatient Rehabilitation Admissions Coordinator   I will place rehab consult for full assessment. Noted daughter prefers CIR over SNF.  Ottie Glazier, RN, MSN Rehab Admissions Coordinator [phone] 03/04/2022 7:42 PM

## 2022-03-05 MED ORDER — AMLODIPINE BESYLATE 10 MG PO TABS
10.0000 mg | ORAL_TABLET | Freq: Every day | ORAL | Status: DC
  Administered 2022-03-06: 10 mg via ORAL
  Filled 2022-03-05: qty 1

## 2022-03-05 MED ORDER — AMLODIPINE BESYLATE 5 MG PO TABS
5.0000 mg | ORAL_TABLET | Freq: Every day | ORAL | Status: DC
  Administered 2022-03-05: 5 mg via ORAL
  Filled 2022-03-05: qty 1

## 2022-03-05 MED ORDER — AMLODIPINE BESYLATE 5 MG PO TABS
5.0000 mg | ORAL_TABLET | ORAL | Status: AC
  Administered 2022-03-05: 5 mg via ORAL
  Filled 2022-03-05: qty 1

## 2022-03-05 NOTE — Progress Notes (Signed)
   03/05/22 1100  Clinical Encounter Type  Visited With Patient  Visit Type Initial  Referral From Nurse  Consult/Referral To Chaplain   Met briefly with patient per the Spiritual Consult List. Nurse had left left [place] in patient's room; however, notes indicate that patient's family will be here after 12:15 today and would like to have document executed (if possible). Chaplain communicated with Kieth Brightly who will be available after Naaman Plummer rounds today. Will pass on Chaplain Albertina Parr who is no duty this afternoon for completion.  Melody Haver, Resident Chaplain [phone]

## 2022-03-05 NOTE — TOC Progression Note (Signed)
Transition of Care [facility]) - Progression Note    Patient Details  Name: Susan Marsh MRN: [id number] Date of Birth: May 17, 1940  [facility] CM/SW Contact  Kermit Balo, RN Phone Number: 03/05/2022, 4:16 PM  Clinical Narrative:    Family has decided on [facility]. CM has asked the [facility] to start auth.  Auth received for tomorrow admit: 12/15-12/19---[id number] Plan is to get her to [employer].   Expected Discharge Plan: Skilled Nursing Facility Barriers to Discharge: Continued Medical Work up  Expected Discharge Plan and Services Expected Discharge Plan: Skilled Nursing Facility In-house Referral: Clinical Social Work Discharge Planning Services: CM Consult Post Acute Care Choice: Skilled Nursing Facility Living arrangements for the past 2 months: Single Family Home                                       Social Determinants of Health (SDOH) Interventions    Readmission Risk Interventions     No data to display

## 2022-03-05 NOTE — Care Management Important Message (Signed)
Important Message  Patient Details  Name: Susan Marsh MRN: [id number] Date of Birth: [birth year]   Medicare Important Message Given:  Yes     Vernell Back Stefan Church 03/05/2022, 11:32 AM

## 2022-03-05 NOTE — Progress Notes (Signed)
[facility] PROGRESS NOTE   Susan Marsh WJX:[id number] DOB: 1940/05/01 DOA: 03/02/2022  PCP: [name], MD  Brief History/Interval Summary: 81 y.o. female with medical history significant of HTN, HLD, cad S/P stent, aortic stenosis. Pt last admitted from 10/29-11/3 with erratic behavior, visual hallucinations, and poor PO intake that was thought to be related to AKI, dehydration, and hyponatremia. At baseline pt fully functional, lives independently, and does all ADLs.  Presented to the ED with left-sided weakness.  Found to have acute stroke.  Hospitalized for further management.  Consultants: Neurology  Procedures: Transthoracic echocardiogram  Subjective/Interval History: No acute issues or events overnight; not very verbose today but pleasantly eating breakfast. ROS limited but shakes head 'no' to all questions this am.  Assessment/Plan:  Acute ischemic stroke Patient with left-sided weakness. MRI shows numerous small areas of acute ischemia in the right hemisphere and also with the left hemisphere. Patient underwent CT angiogram head and neck -unremarkable for vascular abnormalities LDL is 63.  Continue statin A1c 5.6 PT OT SLP evaluation. Continue aspirin indefinitely, Plavix x 21 days Neuro recommending loop recorder, cardiology/EP aware - will be placed at discharge  Questionable urinary tract infection Complains of discomfort with urinating.  UA noted to be abnormal.  Follow-up on urine culture.  Start ceftriaxone for now.  Hypokalemia Within normal limits, magnesium 2.2  SVT Beta-blocker resumed, no further episodes  Essential hypertension Currently allowing permissive hypertension. Restart amlodipine losartan and metoprolol prior to discharge as indicated  Metabolic acidosis with mildly elevated anion gap Resolving, follow repeat labs  DVT Prophylaxis: Lovenox Code Status: Full code Family Communication: Discussed with patient.  No  family available Disposition Plan: To be determined  Status is: Inpatient Remains inpatient appropriate because: Acute stroke  Medications: Scheduled:  aspirin EC  81 mg Oral Daily   clopidogrel  75 mg Oral Daily   enoxaparin (LOVENOX) injection  40 mg Subcutaneous Q24H   metoprolol tartrate  50 mg Oral BID   pravastatin  40 mg Oral Daily   Continuous:  cefTRIAXone (ROCEPHIN)  IV Stopped (03/04/22 1053)   OZH:YQMVHQIONGEXB **OR** acetaminophen (TYLENOL) oral liquid 160 mg/5 mL **OR** acetaminophen, melatonin  Antibiotics: Anti-infectives (From admission, onward)    Start     Dose/Rate Route Frequency Ordered Stop   03/03/22 0945  cefTRIAXone (ROCEPHIN) 1 g in sodium chloride 0.9 % 100 mL IVPB        1 g 200 mL/hr over 30 Minutes Intravenous Every 24 hours 03/03/22 0849        Objective:  Vital Signs  Vitals:   03/05/22 0356 03/05/22 0400 03/05/22 0520 03/05/22 0600  BP: (!) 143/68  132/67   Pulse: 78     Resp: Temp: 98.4 F (36.9 C)     TempSrc: Oral     SpO2: 99%     Weight:      Height:        Intake/Output Summary (Last 24 hours) at 03/05/2022 0717 Last data filed at 03/05/2022 0600 Gross per 24 hour  Intake 880 ml  Output --  Net 880 ml    Filed Weights   03/02/22 1252  Weight: 62.3 kg    General appearance: Awake alert.  In no distress Resp: Clear to auscultation bilaterally.  Normal effort Cardio: S1-S2 is normal regular.  No S3-S4.  No rubs murmurs or bruit GI: Abdomen is soft.  Nontender nondistended.  Bowel sounds are present normal.  No masses organomegaly Extremities: No  edema.   Neurologic: Left hemiparesis noted  Lab Results:  Data Reviewed: I have personally reviewed following labs and reports of the imaging studies  CBC: Recent Labs  Lab 03/02/22 1305 03/04/22 0359  WBC 10.7* 7.5  NEUTROABS 7.8*  --   HGB 16.1* 13.2  HCT 46.5* 37.5  MCV 86.8 87.4  PLT 305 229     Basic Metabolic Panel: Recent Labs  Lab  03/02/22 1305 03/02/22 1415 03/03/22 0305 03/04/22 0359  NA 142  --  142 138  K 2.7*  --  3.4* 3.9  CL 105  --  109 108  CO2 23  --  16* 21*  GLUCOSE 125*  --  120* 108*  BUN 25*  --  18 28*  CREATININE 0.82  --  0.65 0.82  CALCIUM 10.5*  --  9.5 9.1  MG  --  1.9  --  2.2     GFR: Estimated Creatinine Clearance: 45.3 mL/min (by C-G formula based on SCr of 0.82 mg/dL).  Liver Function Tests: Recent Labs  Lab 03/02/22 1305  AST 20  ALT 28  ALKPHOS 67  BILITOT 1.1  PROT 9.4*  ALBUMIN 5.2*      Coagulation Profile: Recent Labs  Lab 03/02/22 1305  INR 1.0      CBG: Recent Labs  Lab 03/02/22 1339 03/03/22 1217 03/03/22 1736 03/03/22 2143  GLUCAP 101* 108* 103* 110*     Lipid Profile: Recent Labs    03/03/22 0305  CHOL 112  HDL 36*  LDLCALC 63  TRIG 67  CHOLHDL 3.1     Radiology Studies: VAS [place] LOWER EXTREMITY VENOUS (DVT)  Result Date: 03/03/2022  Lower Venous DVT Study Patient Name:  Susan Marsh  Date of Exam:   03/03/2022 Medical Rec #: [id number]          Accession #:    [id number] Date of Birth: 27-Jun-1940         Patient Gender: F Patient Age:   57 years Exam Location:  [facility] Procedure:      VAS [place] LOWER EXTREMITY VENOUS (DVT) Referring Phys: Scheryl Marten XU --------------------------------------------------------------------------------  Indications: Stroke.  Comparison Study: no prior Performing Technologist: [place] Ponder RVS  Examination Guidelines: A complete evaluation includes B-mode imaging, spectral Doppler, color Doppler, and power Doppler as needed of all accessible portions of each vessel. Bilateral testing is considered an integral part of a complete examination. Limited examinations for reoccurring indications may be performed as noted. The reflux portion of the exam is performed with the patient in reverse Trendelenburg.  +---------+---------------+---------+-----------+----------+--------------+ RIGHT     CompressibilityPhasicitySpontaneityPropertiesThrombus Aging +---------+---------------+---------+-----------+----------+--------------+ CFV      Full           Yes      Yes                                 +---------+---------------+---------+-----------+----------+--------------+ SFJ      Full                                                        +---------+---------------+---------+-----------+----------+--------------+ FV Prox  Full                                                        +---------+---------------+---------+-----------+----------+--------------+  FV Mid   Full                                                        +---------+---------------+---------+-----------+----------+--------------+ FV DistalFull                                                        +---------+---------------+---------+-----------+----------+--------------+ PFV      Full                                                        +---------+---------------+---------+-----------+----------+--------------+ POP      Full           Yes      Yes                                 +---------+---------------+---------+-----------+----------+--------------+ PTV      Full                                                        +---------+---------------+---------+-----------+----------+--------------+ PERO     Full                                                        +---------+---------------+---------+-----------+----------+--------------+   +---------+---------------+---------+-----------+----------+--------------+ LEFT     CompressibilityPhasicitySpontaneityPropertiesThrombus Aging +---------+---------------+---------+-----------+----------+--------------+ CFV      Full           Yes      Yes                                 +---------+---------------+---------+-----------+----------+--------------+ SFJ      Full                                                         +---------+---------------+---------+-----------+----------+--------------+ FV Prox  Full                                                        +---------+---------------+---------+-----------+----------+--------------+ FV Mid   Full                                                        +---------+---------------+---------+-----------+----------+--------------+  FV DistalFull                                                        +---------+---------------+---------+-----------+----------+--------------+ PFV      Full                                                        +---------+---------------+---------+-----------+----------+--------------+ POP      Full           Yes      Yes                                 +---------+---------------+---------+-----------+----------+--------------+ PTV      Full                                                        +---------+---------------+---------+-----------+----------+--------------+ PERO     Full                                                        +---------+---------------+---------+-----------+----------+--------------+     Summary: BILATERAL: - No evidence of deep vein thrombosis seen in the lower extremities, bilaterally. -No evidence of popliteal cyst, bilaterally.   *See table(s) above for measurements and observations. Electronically signed by Coral Else MD on 03/03/2022 at 11:27:50 PM.    Final    ECHOCARDIOGRAM COMPLETE  Result Date: 03/03/2022    ECHOCARDIOGRAM REPORT   Patient Name:   Susan Marsh Date of Exam: 03/03/2022 Medical Rec #:  [id number]         Height:       63.0 in Accession #:    [id number]        Weight:       137.3 lb Date of Birth:  June 10, 1940        BSA:          1.648 m Patient Age:    80 years          BP:           173/85 mmHg Patient Gender: F                 HR:           98 bpm. Exam Location:  Inpatient Procedure: 2D Echo, Color Doppler, Cardiac  Doppler and Intracardiac            Opacification Agent Indications:    Stroke I63.9  History:        Patient has no prior history of Echocardiogram examinations.                 Risk Factors:Hypertension and Dyslipidemia.  Sonographer:    Leta Jungling RDCS Referring Phys: [id number] JARED M GARDNER IMPRESSIONS  1. Left ventricular ejection fraction, by  estimation, is 70 to 75%. The left ventricle has hyperdynamic function. The left ventricle has no regional wall motion abnormalities. 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 Indeterminate diastolic filling due to E-A fusion. Small intracavitary gradient (14 mm Hg at peak) without LVOT obstruction.  2. Right ventricular systolic function is normal. The right ventricular size is normal.  3. The mitral valve is normal in structure. No evidence of mitral valve regurgitation. No evidence of mitral stenosis.  4. The aortic valve was not well visualized. Aortic valve regurgitation is mild. No aortic stenosis is present. Comparison(s): No prior Echocardiogram. FINDINGS  Left Ventricle: Left ventricular ejection fraction, by estimation, is 70 to 75%. The left ventricle has hyperdynamic function. The left ventricle has no regional wall motion abnormalities. Definity contrast agent was given IV to delineate the left ventricular endocardial borders. The left ventricular internal cavity size was normal in size. 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 Indeterminate diastolic filling due to E-A fusion. Right Ventricle: The right ventricular size is normal. No increase in right ventricular wall thickness. Right ventricular systolic function is normal. Left Atrium: Left atrial size was normal in size. Right Atrium: Right atrial size was normal in size. Pericardium: Trivial pericardial effusion is present. The pericardial effusion is anterior to the right ventricle. Presence of epicardial fat layer.  Mitral Valve: The mitral valve is normal in structure. No evidence of mitral valve regurgitation. No evidence of mitral valve stenosis. Tricuspid Valve: The tricuspid valve is normal in structure. Tricuspid valve regurgitation is not demonstrated. No evidence of tricuspid stenosis. Aortic Valve: The aortic valve was not well visualized. Aortic valve regurgitation is mild. No aortic stenosis is present. Pulmonic Valve: The pulmonic valve was not well visualized. Pulmonic valve regurgitation is not visualized. No evidence of pulmonic stenosis. Aorta: The aortic root is normal in size and structure. IAS/Shunts: No atrial level shunt detected by color flow Doppler.  LEFT VENTRICLE PLAX 2D LVIDd:         2.81 cm LVIDs:         1.90 cm LV PW:         1.15 cm LV IVS:        1.87 cm LVOT diam:     2.20 cm LV SV:         62 LV SV Index:   38 LVOT Area:     3.80 cm  LV Volumes (MOD) LV vol d, MOD A2C: 48.0 ml LV vol d, MOD A4C: 83.4 ml LV vol s, MOD A2C: 14.0 ml LV vol s, MOD A4C: 29.2 ml LV SV MOD A2C:     34.0 ml LV SV MOD A4C:     83.4 ml LV SV MOD BP:      42.9 ml RIGHT VENTRICLE TAPSE (M-mode): 1.7 cm LEFT ATRIUM             Index LA diam:        3.40 cm 2.06 cm/m LA Vol (A2C):   35.0 ml 21.24 ml/m LA Vol (A4C):   35.5 ml 21.54 ml/m LA Biplane Vol: 36.5 ml 22.15 ml/m  AORTIC VALVE LVOT Vmax:   111.00 cm/s LVOT Vmean:  76.700 cm/s LVOT VTI:    0.164 m  AORTA Ao Root diam: 3.00 cm TRICUSPID VALVE TR Peak grad:   9.0 mmHg TR Vmax:        150.00 cm/s  SHUNTS Systemic VTI:  0.16 m Systemic Diam: 2.20 cm  Riley Lam MD Electronically signed by Riley Lam MD Signature Date/Time: 03/03/2022/12:38:27 PM    Final        LOS: 3 days   Azucena Fallen DO  [facility] Pager on [email or web address]  03/05/2022, 7:17 AM

## 2022-03-05 NOTE — Progress Notes (Signed)
Inpatient Rehab Admissions:  Inpatient Rehab Consult received.  I met with patient at the bedside for rehabilitation assessment and to discuss goals and expectations of an inpatient rehab admission.  Pt didn't appear to understand [facility] so contact daughter Malachy Mood. Discussed average length of stay, insurance authorization requirement, discharge home after completion of CIR, 24/7 support after discharge. Malachy Mood would like to discuss rehab options with brothers before making a decision regarding CIR versus SNF. Will continue to follow.  Signed: Gayland Curry, [place], [place] Admissions Coordinator [phone]

## 2022-03-05 NOTE — Progress Notes (Signed)
03/05/22 1430  Clinical Encounter Type  Visited With Patient and family together (DAUGHTER: Susan Marsh)  Visit Type Initial  Referral From Chaplain Melody Haver)  Consult/Referral To Chaplain Susan Parr Morene Crocker)  Recommendations Advance Directive Education  Advance Directives (For Healthcare)  Does Patient Have a Medical Advance Directive? No  Would patient like information on creating a medical advance directive? Yes (Inpatient - patient defers creating a medical advance directive at this time - Information given)  [place] Directives  Does Patient Have a Mental Health Advance Directive? No   Chaplain responded to Spiritual Care Consultation requesting assistance for Advance Directive preparation with [name]. [place]. Chaplain met with [name] and her daughter Susan Marsh at patient's bedside on 03/05/2022 at 1430.   [name] stated that she does NOT have a court appointed [place].  [name] stated that she is NOT Married.   [name] indicated that she intends to list her DAUGHTER: Susan Marsh,  [phone] as her [place].   [name] indicated that she intends to list her SON: Susan Marsh,  [phone] as her [place].  Chaplain provided the Advance Directive packet as well as education on Advance Directives-documents an individual completes to communicate their health care directions in advance of a time when they may need them. Chaplain informed [name] the documents which may be completed here in the hospital are the Living Will and [place].   Chaplain informed [name] that the [place] is a legal document in which an individual names another person, as their [place], to communicate her health care decisions, when she is not able to  communicate them for herself. The Health Care Agent's function can be temporary or permanent depending on her ability to make and communicate those decisions independently.   Chaplain informed [name] in the absence of a [place], the state of [place] directs health care providers to look to the following individuals in the order listed: legal guardian; an attorney-in-fact under a general power of attorney (POA) if that POA includes the right to make health care decisions; person's spouse; a 36 of her adult children; a 60 of adult brothers and sisters; or an individual who has an established relationship with you, who is acting in good faith and who can convey your wishes.  If none of these persons are available or willing to make medical decisions on a patient's behalf, the law allows the patient's doctor to make decisions for them as long as another doctor agrees with those decisions.    Chaplain also informed the patient that the Health Care agent has no decision-making authority over any affairs other than those related to her medical care.  The chaplain further educated [name] that a Living Will is a legal document that allows her desires not to receive life-prolonging measures in the event that she has a condition that is incurable and will result in her death in a short period of time; they are unconscious, and doctors are confident that she will not regain consciousness; and/or she has advanced dementia or other substantial and irreversible loss of mental function.   The chaplain informed [name] that life-prolonging measures are medical treatments that would only serve to postpone death, including breathing machines, kidney dialysis, antibiotics, artificial  nutrition and hydration (tube feeding), and similar forms of treatment and that if an individual is able to express their wishes, they may also make them known without the use of a Living  Will, but in the event that she is not able to express her wishes, a Living Will allows medical providers and the her family and friends to ensure that they are not making decisions on her behalf, but rather serving as her voice to convey decisions that she has already made.   [name] is aware that the decision to create an advance directive is hers alone and she may choose not to complete the documents or may choose to complete one portion or both.  [name] was informed that she can revoke the documents at any time by striking through them and writing void or by completing new documents, but that it is also advisable that the individual verbally notify interested parties that her wishes have changed.   [name] is also aware that the document must be signed in the presence of a notary public and two witnesses and that this may be done while the patient is still admitted to the hospital or after discharge in the community. If they decide to complete Advance Directives after being discharged from the hospital, they have been advised to notify all interested parties and to provide those documents to their physicians and loved ones in addition to bringing them to the hospital in the event of another hospitalization.   The chaplain informed the [name] that if she desires to proceed with completing the Advance Directive Documentation while she is still admitted, notary services are typically available at [facility] between the hours of 1:00 and 3:30 Monday-Thursday.   When the patient is ready to have these documents completed, the patient should request that their nurse place a spiritual care consult and indicate that the patient is ready to have their advance directives notarized so that arrangements for witnesses and notary public can be made.  If there is an immediate need to have notarized please page the Chaplain.   Please page spiritual care if the patient desires further  education or has questions.  [address] [place], [place] [phone].

## 2022-03-06 ENCOUNTER — Encounter (HOSPITAL_COMMUNITY)
Admission: EM | Disposition: A | Discharge status: Skilled Nursing Facility | Payer: Self-pay | Source: Home / Self Care | Attending: Internal Medicine

## 2022-03-06 DIAGNOSIS — N390 Urinary tract infection, site not specified: Secondary | ICD-10-CM | POA: Diagnosis not present

## 2022-03-06 DIAGNOSIS — E785 Hyperlipidemia, unspecified: Secondary | ICD-10-CM | POA: Diagnosis not present

## 2022-03-06 DIAGNOSIS — E860 Dehydration: Secondary | ICD-10-CM | POA: Diagnosis not present

## 2022-03-06 DIAGNOSIS — R41841 Cognitive communication deficit: Secondary | ICD-10-CM | POA: Diagnosis not present

## 2022-03-06 DIAGNOSIS — R062 Wheezing: Secondary | ICD-10-CM | POA: Diagnosis not present

## 2022-03-06 DIAGNOSIS — I471 Supraventricular tachycardia, unspecified: Secondary | ICD-10-CM | POA: Diagnosis not present

## 2022-03-06 DIAGNOSIS — J189 Pneumonia, unspecified organism: Secondary | ICD-10-CM | POA: Diagnosis not present

## 2022-03-06 DIAGNOSIS — R531 Weakness: Secondary | ICD-10-CM | POA: Diagnosis not present

## 2022-03-06 DIAGNOSIS — R296 Repeated falls: Secondary | ICD-10-CM | POA: Diagnosis not present

## 2022-03-06 DIAGNOSIS — I639 Cerebral infarction, unspecified: Secondary | ICD-10-CM | POA: Diagnosis not present

## 2022-03-06 DIAGNOSIS — N3000 Acute cystitis without hematuria: Secondary | ICD-10-CM | POA: Diagnosis not present

## 2022-03-06 DIAGNOSIS — M1990 Unspecified osteoarthritis, unspecified site: Secondary | ICD-10-CM | POA: Diagnosis not present

## 2022-03-06 DIAGNOSIS — E782 Mixed hyperlipidemia: Secondary | ICD-10-CM | POA: Diagnosis not present

## 2022-03-06 DIAGNOSIS — I2510 Atherosclerotic heart disease of native coronary artery without angina pectoris: Secondary | ICD-10-CM | POA: Diagnosis not present

## 2022-03-06 DIAGNOSIS — R509 Fever, unspecified: Secondary | ICD-10-CM | POA: Diagnosis not present

## 2022-03-06 DIAGNOSIS — K641 Second degree hemorrhoids: Secondary | ICD-10-CM | POA: Diagnosis not present

## 2022-03-06 DIAGNOSIS — I6990 Unspecified sequelae of unspecified cerebrovascular disease: Secondary | ICD-10-CM | POA: Diagnosis not present

## 2022-03-06 DIAGNOSIS — I69854 Hemiplegia and hemiparesis following other cerebrovascular disease affecting left non-dominant side: Secondary | ICD-10-CM | POA: Diagnosis not present

## 2022-03-06 DIAGNOSIS — R1312 Dysphagia, oropharyngeal phase: Secondary | ICD-10-CM | POA: Diagnosis not present

## 2022-03-06 DIAGNOSIS — M79642 Pain in left hand: Secondary | ICD-10-CM | POA: Diagnosis not present

## 2022-03-06 DIAGNOSIS — R0600 Dyspnea, unspecified: Secondary | ICD-10-CM | POA: Diagnosis not present

## 2022-03-06 DIAGNOSIS — R2681 Unsteadiness on feet: Secondary | ICD-10-CM | POA: Diagnosis not present

## 2022-03-06 DIAGNOSIS — K2100 Gastro-esophageal reflux disease with esophagitis, without bleeding: Secondary | ICD-10-CM | POA: Diagnosis not present

## 2022-03-06 DIAGNOSIS — M6281 Muscle weakness (generalized): Secondary | ICD-10-CM | POA: Diagnosis not present

## 2022-03-06 DIAGNOSIS — I6349 Cerebral infarction due to embolism of other cerebral artery: Secondary | ICD-10-CM | POA: Diagnosis not present

## 2022-03-06 DIAGNOSIS — E876 Hypokalemia: Secondary | ICD-10-CM | POA: Diagnosis not present

## 2022-03-06 DIAGNOSIS — K5901 Slow transit constipation: Secondary | ICD-10-CM | POA: Diagnosis not present

## 2022-03-06 DIAGNOSIS — Z7401 Bed confinement status: Secondary | ICD-10-CM | POA: Diagnosis not present

## 2022-03-06 DIAGNOSIS — I10 Essential (primary) hypertension: Secondary | ICD-10-CM | POA: Diagnosis not present

## 2022-03-06 DIAGNOSIS — I350 Nonrheumatic aortic (valve) stenosis: Secondary | ICD-10-CM | POA: Diagnosis not present

## 2022-03-06 DIAGNOSIS — R279 Unspecified lack of coordination: Secondary | ICD-10-CM | POA: Diagnosis not present

## 2022-03-06 DIAGNOSIS — U071 COVID-19: Secondary | ICD-10-CM | POA: Diagnosis not present

## 2022-03-06 DIAGNOSIS — G8194 Hemiplegia, unspecified affecting left nondominant side: Secondary | ICD-10-CM | POA: Diagnosis not present

## 2022-03-06 HISTORY — PX: LOOP RECORDER INSERTION: EP1214

## 2022-03-06 LAB — BASIC METABOLIC PANEL
Anion gap: 13 (ref 5–15)
BUN: 17 mg/dL (ref 8–23)
CO2: 19 mmol/L — ABNORMAL LOW (ref 22–32)
Calcium: 9.4 mg/dL (ref 8.9–10.3)
Chloride: 108 mmol/L (ref 98–111)
Creatinine, Ser: 0.7 mg/dL (ref 0.44–1.00)
GFR, Estimated: >60 mL/min (ref >60)
Glucose, Bld: 100 mg/dL — ABNORMAL HIGH (ref 70–99)
Potassium: 3.6 mmol/L (ref 3.5–5.1)
Sodium: 140 mmol/L (ref 135–145)

## 2022-03-06 LAB — MAGNESIUM: Magnesium: 2 mg/dL (ref 1.7–2.4)

## 2022-03-06 SURGERY — LOOP RECORDER INSERTION

## 2022-03-06 MED ORDER — ASPIRIN 81 MG PO TBEC: 81.0000 mg | DELAYED_RELEASE_TABLET | Freq: Every day | ORAL | 12 refills | Status: DC

## 2022-03-06 MED ORDER — LIDOCAINE-EPINEPHRINE 1 %-1:100000 IJ SOLN
INTRAMUSCULAR | Status: AC
  Filled 2022-03-06: qty 2

## 2022-03-06 MED ORDER — LIDOCAINE-EPINEPHRINE 1 %-1:100000 IJ SOLN
INTRAMUSCULAR | Status: DC | PRN
  Administered 2022-03-06: 10 mL via INTRADERMAL

## 2022-03-06 MED ORDER — CLOPIDOGREL BISULFATE 75 MG PO TABS: 75.0000 mg | ORAL_TABLET | Freq: Every day | ORAL | 0 refills | Status: AC

## 2022-03-06 SURGICAL SUPPLY — 2 items
PACK LOOP INSERTION (CUSTOM PROCEDURE TRAY) ×1 IMPLANT
SYSTEM MONITOR REVEAL LINQ II (Prosthesis & Implant Heart) IMPLANT

## 2022-03-06 NOTE — Progress Notes (Signed)
Inpatient Rehab Admissions Coordinator:  Pt's daughter left a message saying that family has decided on [facility] for pt's rehab. TOC aware. AC will sign off.   Wolfgang Phoenix, MS, CCC-SLP Admissions Coordinator [phone]

## 2022-03-06 NOTE — Consult Note (Addendum)
ELECTROPHYSIOLOGY CONSULT NOTE  Patient ID: Susan Marsh MRN: [id number], DOB/AGE: [birth year]   Admit date: 03/02/2022 Date of Consult: 03/06/2022  Primary Physician: Shirline Frees, MD Primary Cardiologist: None  Primary Electrophysiologist: New to None  Reason for Consultation: Cryptogenic stroke; recommendations regarding Implantable Loop Recorder Insurance: [employer]  History of Present Illness EP has been asked to evaluate Susan Marsh for placement of an implantable loop recorder to monitor for atrial fibrillation by Dr Erlinda Hong.  The patient was admitted on 03/02/2022 with with L face and arm weakness, L hemineglect, and impaired naming. Head CT showed possible acute infarct R caudate but deemed beyond intervention window. .    Imaging demonstrated Acute scattered embolic stroke, etiology unclear, concerning cardioembolic source .    She has undergone workup for stroke:  Code Stroke CT head Age-indeterminate infarcts in the right caudate head and possibly the right frontal opercular region. Dense calcification at the right M1/M2 junction and along the sylvian and opercular segments of the distal right MCA branches. CTA head & neck Moderate stenosis in the P2 segment of the left PCA and mild narrowing of the mid M2 segment of the right MCA. Mild stenosis of the origins of bilateral ICAs.  MRI shows numerous small areas of acute ischemia throughout the right hemisphere, in multiple vascular territories. The greatest burden is in the right MCA territory. A few foci of acute ischemia in the left hemisphere, also in multiple vascular territories. Pattern is most consistent with a central embolic source (heart or proximal aorta). 2D Echo EF 70 - 75% Vas [place] LE no DVT LDL 63 HgbA1c 5.9 Recommend Loop recorder on discharge VTE prophylaxis - enoxaparin No antithrombotic prior to admission, now on aspirin 81 mg daily and clopidogrel 75 mg daily for 21 days, then aspirin 81 mg  alone daily indefinitely Therapy recommendations:  SNF   The patient has been monitored on telemetry which has demonstrated sinus rhythm with no arrhythmias.  Inpatient stroke work-up will not require a TEE per Neurology.   Echocardiogram as above. Lab work is reviewed.  Prior to admission, the patient denies chest pain, shortness of breath, dizziness, palpitations, or syncope.  She is recovering from her stroke with plans to rehab at SNF  at discharge.  Past Medical History:  Diagnosis Date   Aortic stenosis    ASCVD (arteriosclerotic cardiovascular disease)    s/p stent   GERD (gastroesophageal reflux disease)    Hearing loss    R>L   High cholesterol    Hypertension    Osteoarthritis      Surgical History:  Past Surgical History:  Procedure Laterality Date   BLADDER SURGERY     CARDIAC SURGERY       Medications Prior to Admission  Medication Sig Dispense Refill Last Dose   acetaminophen (TYLENOL) 325 MG tablet Take 650 mg by mouth every 6 (six) hours as needed for headache.   03/02/2022   amLODipine (NORVASC) 10 MG tablet Take 10 mg by mouth daily.   03/02/2022   busPIRone (BUSPAR) 10 MG tablet Take 5-10 mg by mouth 2 (two) times daily.   03/02/2022   famotidine (PEPCID) 20 MG tablet Take 1 tablet (20 mg total) by mouth 2 (two) times daily. 60 tablet 0 03/02/2022   losartan (COZAAR) 50 MG tablet Take 50 mg by mouth daily.   unk   metoprolol (LOPRESSOR) 50 MG tablet Take 50 mg by mouth 2 (two) times daily.   03/02/2022 at morning  unk exact time   ondansetron (ZOFRAN-ODT) 4 MG disintegrating tablet Take 4 mg by mouth 3 (three) times daily.   unk   pravastatin (PRAVACHOL) 40 MG tablet Take 40 mg by mouth daily.   03/02/2022   Lidocaine (ZTLIDO) 1.8 % PTCH Apply 1.8 patches topically as needed. (Patient not taking: Reported on 03/02/2022) 30 patch 6 Not Taking   potassium chloride SA (KLOR-CON M) 20 MEQ tablet Take 1 tablet (20 mEq total) by mouth 2 (two) times daily. (Patient  not taking: Reported on 03/02/2022) 30 tablet 0 Not Taking    Inpatient Medications:   amLODipine  10 mg Oral Daily   aspirin EC  81 mg Oral Daily   clopidogrel  75 mg Oral Daily   enoxaparin (LOVENOX) injection  40 mg Subcutaneous Q24H   metoprolol tartrate  50 mg Oral BID   pravastatin  40 mg Oral Daily    Allergies:  Allergies  Allergen Reactions   Alendronate Sodium     Other reaction(s): GI upset   Other     Cortisporin otic Other reaction(s): GI upset    Social History   Socioeconomic History   Marital status: Unknown    Spouse name: Not on file   Number of children: 4   Years of education: 12   Highest education level: High school graduate  Occupational History   Occupation: Retired  Tobacco Use   Smoking status: Never   Smokeless tobacco: Not on file  Substance and Sexual Activity   Alcohol use: Never   Drug use: Never   Sexual activity: Not on file  Other Topics Concern   Not on file  Social History Narrative   Lives with son.   Right-handed.   No daily caffeine.   Social Determinants of Health   Financial Resource Strain: Not on file  Food Insecurity: No Food Insecurity (03/05/2022)   Hunger Vital Sign    Worried About Running Out of Food in the Last Year: Never true    Ran Out of Food in the Last Year: Never true  Transportation Needs: No Transportation Needs (03/05/2022)   PRAPARE - Hydrologist (Medical): No    Lack of Transportation (Non-Medical): No  Physical Activity: Not on file  Stress: Not on file  Social Connections: Not on file  Intimate Partner Violence: Not At Risk (03/05/2022)   Humiliation, Afraid, Rape, and Kick questionnaire    Fear of Current or Ex-Partner: No    Emotionally Abused: No    Physically Abused: No    Sexually Abused: No     Family History  Problem Relation Age of Onset   Other Mother        unsure of history   Other Father        unsure of history   Heart disease Brother        Review of Systems: All other systems reviewed and are otherwise negative except as noted above.  Physical Exam: Vitals:   03/05/22 2008 03/05/22 2341 03/06/22 0324 03/06/22 0620  BP: (!) 176/96 (!) 166/88 (!) 160/85 (!) 152/86  Pulse: 85 73 86   Resp: 18 20 19 13   Temp: 97.8 F (36.6 C) 97.7 F (36.5 C) 98.1 F (36.7 C)   TempSrc: Oral Oral Oral   SpO2: 98% 99% 98%   Weight:      Height:        GEN- The patient is elderly appearing, alert and oriented x 3 today.  Head- normocephalic, atraumatic Eyes-  Sclera clear, conjunctiva pink Ears- hearing intact Oropharynx- clear Neck- supple Lungs- Clear to ausculation bilaterally, normal work of breathing Heart- Regular rate and rhythm, no murmurs, rubs or gallops  GI- soft, NT, ND, + BS Extremities- no clubbing, cyanosis, or edema MS- no significant deformity or atrophy Skin- no rash or lesion Psych- euthymic mood, full affect Neuro- Some left sided deficit persists   Labs:   Lab Results  Component Value Date   WBC 7.5 03/04/2022   HGB 13.2 03/04/2022   HCT 37.5 03/04/2022   MCV 87.4 03/04/2022   PLT 229 03/04/2022    Recent Labs  Lab 03/02/22 1305 03/03/22 0305 03/06/22 0607  NA 142   < > 140  K 2.7*   < > 3.6  CL 105   < > 108  CO2 23   < > 19*  BUN 25*   < > 17  CREATININE 0.82   < > 0.70  CALCIUM 10.5*   < > 9.4  PROT 9.4*  --   --   BILITOT 1.1  --   --   ALKPHOS 67  --   --   ALT 28  --   --   AST 20  --   --   GLUCOSE 125*   < > 100*   < > = values in this interval not displayed.     Radiology/Studies: VAS [place] LOWER EXTREMITY VENOUS (DVT)  Result Date: 03/03/2022  Lower Venous DVT Study Patient Name:  Susan Marsh  Date of Exam:   03/03/2022 Medical Rec #: [id number]          Accession #:    [id number] Date of Birth: Oct 23, 1940         Patient Gender: F Patient Age:   89 years Exam Location:  [facility] Procedure:      VAS [place] LOWER EXTREMITY VENOUS (DVT) Referring Phys: Cornelius Moras  XU --------------------------------------------------------------------------------  Indications: Stroke.  Comparison Study: no prior Performing Technologist: Archie Patten RVS  Examination Guidelines: A complete evaluation includes B-mode imaging, spectral Doppler, color Doppler, and power Doppler as needed of all accessible portions of each vessel. Bilateral testing is considered an integral part of a complete examination. Limited examinations for reoccurring indications may be performed as noted. The reflux portion of the exam is performed with the patient in reverse Trendelenburg.  +---------+---------------+---------+-----------+----------+--------------+ RIGHT    CompressibilityPhasicitySpontaneityPropertiesThrombus Aging +---------+---------------+---------+-----------+----------+--------------+ CFV      Full           Yes      Yes                                 +---------+---------------+---------+-----------+----------+--------------+ SFJ      Full                                                        +---------+---------------+---------+-----------+----------+--------------+ FV Prox  Full                                                        +---------+---------------+---------+-----------+----------+--------------+ FV Mid  Full                                                        +---------+---------------+---------+-----------+----------+--------------+ FV DistalFull                                                        +---------+---------------+---------+-----------+----------+--------------+ PFV      Full                                                        +---------+---------------+---------+-----------+----------+--------------+ POP      Full           Yes      Yes                                 +---------+---------------+---------+-----------+----------+--------------+ PTV      Full                                                         +---------+---------------+---------+-----------+----------+--------------+ PERO     Full                                                        +---------+---------------+---------+-----------+----------+--------------+   +---------+---------------+---------+-----------+----------+--------------+ LEFT     CompressibilityPhasicitySpontaneityPropertiesThrombus Aging +---------+---------------+---------+-----------+----------+--------------+ CFV      Full           Yes      Yes                                 +---------+---------------+---------+-----------+----------+--------------+ SFJ      Full                                                        +---------+---------------+---------+-----------+----------+--------------+ FV Prox  Full                                                        +---------+---------------+---------+-----------+----------+--------------+ FV Mid   Full                                                        +---------+---------------+---------+-----------+----------+--------------+  FV DistalFull                                                        +---------+---------------+---------+-----------+----------+--------------+ PFV      Full                                                        +---------+---------------+---------+-----------+----------+--------------+ POP      Full           Yes      Yes                                 +---------+---------------+---------+-----------+----------+--------------+ PTV      Full                                                        +---------+---------------+---------+-----------+----------+--------------+ PERO     Full                                                        +---------+---------------+---------+-----------+----------+--------------+     Summary: BILATERAL: - No evidence of deep vein thrombosis seen in the lower extremities, bilaterally. -No  evidence of popliteal cyst, bilaterally.   *See table(s) above for measurements and observations. Electronically signed by Harold Barban MD on 03/03/2022 at 11:27:50 PM.    Final    ECHOCARDIOGRAM COMPLETE  Result Date: 03/03/2022    ECHOCARDIOGRAM REPORT   Patient Name:   Susan Marsh Date of Exam: 03/03/2022 Medical Rec #:  [id number]         Height:       63.0 in Accession #:    [id number]        Weight:       137.3 lb Date of Birth:  Aug 22, 1940        BSA:          1.648 m Patient Age:    58 years          BP:           173/85 mmHg Patient Gender: F                 HR:           98 bpm. Exam Location:  Inpatient Procedure: 2D Echo, Color Doppler, Cardiac Doppler and Intracardiac            Opacification Agent Indications:    Stroke I63.9  History:        Patient has no prior history of Echocardiogram examinations.                 Risk Factors:Hypertension and Dyslipidemia.  Sonographer:    Darlina Sicilian RDCS Referring Phys: [place]  1. Left ventricular ejection fraction, by  estimation, is 70 to 75%. The left ventricle has hyperdynamic function. The left ventricle has no regional wall motion abnormalities. 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 Indeterminate diastolic filling due to E-A fusion. Small intracavitary gradient (14 mm Hg at peak) without LVOT obstruction.  2. Right ventricular systolic function is normal. The right ventricular size is normal.  3. The mitral valve is normal in structure. No evidence of mitral valve regurgitation. No evidence of mitral stenosis.  4. The aortic valve was not well visualized. Aortic valve regurgitation is mild. No aortic stenosis is present. Comparison(s): No prior Echocardiogram. FINDINGS  Left Ventricle: Left ventricular ejection fraction, by estimation, is 70 to 75%. The left ventricle has hyperdynamic function. The left ventricle has no regional wall motion abnormalities. Definity  contrast agent was given IV to delineate the left ventricular endocardial borders. The left ventricular internal cavity size was normal in size. 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 Indeterminate diastolic filling due to E-A fusion. Right Ventricle: The right ventricular size is normal. No increase in right ventricular wall thickness. Right ventricular systolic function is normal. Left Atrium: Left atrial size was normal in size. Right Atrium: Right atrial size was normal in size. Pericardium: Trivial pericardial effusion is present. The pericardial effusion is anterior to the right ventricle. Presence of epicardial fat layer. Mitral Valve: The mitral valve is normal in structure. No evidence of mitral valve regurgitation. No evidence of mitral valve stenosis. Tricuspid Valve: The tricuspid valve is normal in structure. Tricuspid valve regurgitation is not demonstrated. No evidence of tricuspid stenosis. Aortic Valve: The aortic valve was not well visualized. Aortic valve regurgitation is mild. No aortic stenosis is present. Pulmonic Valve: The pulmonic valve was not well visualized. Pulmonic valve regurgitation is not visualized. No evidence of pulmonic stenosis. Aorta: The aortic root is normal in size and structure. IAS/Shunts: No atrial level shunt detected by color flow Doppler.  LEFT VENTRICLE PLAX 2D LVIDd:         2.81 cm LVIDs:         1.90 cm LV PW:         1.15 cm LV IVS:        1.87 cm LVOT diam:     2.20 cm LV SV:         62 LV SV Index:   38 LVOT Area:     3.80 cm  LV Volumes (MOD) LV vol d, MOD A2C: 48.0 ml LV vol d, MOD A4C: 83.4 ml LV vol s, MOD A2C: 14.0 ml LV vol s, MOD A4C: 29.2 ml LV SV MOD A2C:     34.0 ml LV SV MOD A4C:     83.4 ml LV SV MOD BP:      42.9 ml RIGHT VENTRICLE TAPSE (M-mode): 1.7 cm LEFT ATRIUM             Index LA diam:        3.40 cm 2.06 cm/m LA Vol (A2C):   35.0 ml 21.24 ml/m LA Vol (A4C):   35.5 ml 21.54 ml/m LA Biplane  Vol: 36.5 ml 22.15 ml/m  AORTIC VALVE LVOT Vmax:   111.00 cm/s LVOT Vmean:  76.700 cm/s LVOT VTI:    0.164 m  AORTA Ao Root diam: 3.00 cm TRICUSPID VALVE TR Peak grad:   9.0 mmHg TR Vmax:        150.00 cm/s  SHUNTS Systemic VTI:  0.16 m Systemic Diam: 2.20  cm Rudean Haskell MD Electronically signed by Rudean Haskell MD Signature Date/Time: 03/03/2022/12:38:27 PM    Final    MR BRAIN WO CONTRAST  Result Date: 03/02/2022 CLINICAL DATA:  Acute neurologic deficit EXAM: MRI HEAD WITHOUT CONTRAST TECHNIQUE: Multiplanar, multiecho pulse sequences of the brain and surrounding structures were obtained without intravenous contrast. COMPARISON:  None Available. FINDINGS: Brain: Numerous small areas of acute ischemia throughout the right hemisphere, in multiple vascular territories. There are a few foci of acute ischemia in the left hemisphere, also in multiple vascular territories. The greatest burden is in the right MCA territory. No acute or chronic hemorrhage. There is confluent hyperintense T2-weighted signal within the white matter. Generalized volume loss. The midline structures are normal. Vascular: Major flow voids are preserved. Skull and upper cervical spine: Normal calvarium and skull base. Visualized upper cervical spine and soft tissues are normal. Sinuses/Orbits:No paranasal sinus fluid levels or advanced mucosal thickening. No mastoid or middle ear effusion. Normal orbits. IMPRESSION: 1. Numerous small areas of acute ischemia throughout the right hemisphere, in multiple vascular territories. The greatest burden is in the right MCA territory. No hemorrhage or mass effect. 2. A few foci of acute ischemia in the left hemisphere, also in multiple vascular territories. 3. Pattern is most consistent with a central embolic source (heart or proximal aorta). Electronically Signed   By: Ulyses Jarred M.D.   On: 03/02/2022 22:13   CT ANGIO HEAD NECK W WO CM W PERF (CODE STROKE)  Result Date:  03/02/2022 CLINICAL DATA:  Stroke code EXAM: CT ANGIOGRAPHY HEAD AND NECK CT PERFUSION BRAIN TECHNIQUE: Multidetector CT imaging of the head and neck was performed using the standard protocol during bolus administration of intravenous contrast. Multiplanar CT image reconstructions and MIPs were obtained to evaluate the vascular anatomy. Carotid stenosis measurements (when applicable) are obtained utilizing NASCET criteria, using the distal internal carotid diameter as the denominator. Multiphase CT imaging of the brain was performed following IV bolus contrast injection. Subsequent parametric perfusion maps were calculated using RAPID software. RADIATION DOSE REDUCTION: This exam was performed according to the departmental dose-optimization program which includes automated exposure control, adjustment of the mA and/or kV according to patient size and/or use of iterative reconstruction technique. CONTRAST:  152mL OMNIPAQUE IOHEXOL 350 MG/ML SOLN COMPARISON:  Same-day CT brain FINDINGS: CT HEAD FINDINGS Please see same-day CT brain for intracranial findings. No contrast-enhancing lesion is visualized. CTA NECK FINDINGS Aortic arch: Standard branching. Imaged portion shows no evidence of aneurysm or dissection. No significant stenosis of the major arch vessel origins. Right carotid system: No evidence of dissection, stenosis (50% or greater) or occlusion. Mild narrowing of the origin of the right ICA. Left carotid system: No evidence of dissection, stenosis (50% or greater) or occlusion. Mild narrowing of the origin of the left ICA. Vertebral arteries: Left dominant. There is likely mild-to-moderate stenosis of the V4 segment of the left vertebral artery. Skeleton: Multilevel degenerative changes in the cervical spine. No evidence of high-grade spinal canal stenosis. Other neck: None Upper chest: None Review of the MIP images confirms the above findings CTA HEAD FINDINGS Anterior circulation: Mild narrowing of the mid  M2 segment of the right MCA (series 10, image 82). No aneurysm. Posterior circulation: Moderate stenosis in the P2 segment of the left PCA (series 10, image 83). Mild-to-moderate narrowing of the origin of the left IJ. Venous sinuses: As permitted by contrast timing, patent. Anatomic variants: None Review of the MIP images confirms the above findings CT Brain Perfusion  Findings: ASPECTS: 8 CBF (<30%) Volume: 8mL Perfusion (Tmax>6.0s) volume: 16mL Mismatch Volume: 82mL Infarction Location:None IMPRESSION: 1. No intracranial large vessel occlusion. Moderate stenosis in the P2 segment of the left PCA and mild narrowing of the mid M2 segment of the right MCA. 2. Mild stenosis of the origins of bilateral ICAs. 3. No evidence of infarct core or penumbra on CT perfusion. Electronically Signed   By: Marin Roberts M.D.   On: 03/02/2022 14:25   CT HEAD CODE STROKE WO CONTRAST  Result Date: 03/02/2022 CLINICAL DATA:  Code stroke.  No lateralizing information available. EXAM: CT HEAD WITHOUT CONTRAST TECHNIQUE: Contiguous axial images were obtained from the base of the skull through the vertex without intravenous contrast. RADIATION DOSE REDUCTION: This exam was performed according to the departmental dose-optimization program which includes automated exposure control, adjustment of the mA and/or kV according to patient size and/or use of iterative reconstruction technique. COMPARISON:  None Available. FINDINGS: Brain: There is an age indeterminate infarct in the right caudate head. Focal hypodensities in the right frontal lobe (series 3, image 15, 16). There is sequela mild chronic microvascular ischemic change. No hemorrhage. No hydrocephalus. No extra-axial fluid collection. Vascular: There is dense calcification at the right M1 M2 junction and along the sylvian and opercular segments of the distal right MCA branches. Skull: Normal. Negative for fracture or focal lesion. Sinuses/Orbits: Right lens replacement. Paranasal  sinuses are clear. Other: None ASPECTS (Patrick Stroke Program Early CT Score): 8 - Ganglionic level infarction (caudate, lentiform nuclei, internal capsule, insula, M1-M3 cortex): 6 - Supraganglionic infarction (M4-M6 cortex): 2 IMPRESSION: 1. Age-indeterminate infarcts in the right caudate head and possibly the right frontal opercular region. No hemorrhage. Aspects is 8. 2. Dense calcification at the right M1/M2 junction and along the sylvian and opercular segments of the distal right MCA branches. Findings were discussed with Dr. Tyrone Nine on 03/02/22 at 1:26 PM. Electronically Signed   By: Marin Roberts M.D.   On: 03/02/2022 13:28    12-lead ECG on arrival showed sinus tach at 110 bpm (personally reviewed) All prior EKG's in EPIC reviewed with no documented atrial fibrillation  Telemetry NSR/ST 70-90s mostly, as high as 120s (personally reviewed)  Assessment and Plan:  1. Cryptogenic stroke The patient presents with cryptogenic stroke.  The patient does not have a TEE planned for this AM.  I spoke at length with the patient about monitoring for afib with an implantable loop recorder.  Risks, benefits, and alteratives to implantable loop recorder were discussed with the patient today.   At this time, the patient is very clear in their decision to proceed with implantable loop recorder.   Will discuss with family  Wound care was reviewed with the patient (keep incision clean and dry for 3 days). Please call with questions.   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  Loop recording is I think appropriately indicated particularly in light of the asymmetric septal hypertrophy which might suggest hypertrophic cardiomyopathy the importance of  which is not in her mind and her family and and the indications for anticoagulation.

## 2022-03-06 NOTE — TOC Transition Note (Signed)
Transition of Care [facility]) - CM/SW Discharge Note   Patient Details  Name: DARCEL ZICK MRN: [id number] Date of Birth: [birth year]  Transition of Care [facility]) CM/SW Contact:  Kermit Balo, RN Phone Number: 03/06/2022, 3:19 PM   Clinical Narrative:    Pt is discharging to [facility] today. Auth received yesterday. Daughter and bedside RN aware. Pt will transport via [facility].  Number for report: [phone]   Final next level of care: Skilled Nursing Facility Barriers to Discharge: No Barriers Identified   Patient Goals and CMS Choice   CMS Medicare.gov Compare Post Acute Care list provided to:: Patient Represenative (must comment) Choice offered to / list presented to : Adult Children  Discharge Placement              Patient chooses bed at: [facility] Patient to be transferred to facility by: [facility] Name of family member notified: Elnita Maxwell Patient and family notified of of transfer: 03/06/22  Discharge Plan and Services In-house Referral: Clinical Social Work Discharge Planning Services: CM Consult Post Acute Care Choice: Skilled Nursing Facility                               Social Determinants of Health (SDOH) Interventions     Readmission Risk Interventions     No data to display

## 2022-03-06 NOTE — Discharge Instructions (Signed)
Care After Your Loop Recorder  You have a [employer] Loop Recorder   Monitor your cardiac device site for redness, swelling, and drainage. Call the device clinic at [phone] if you experience these symptoms or fever/chills.  If you notice bleeding from your site, hold firm, but gently pressure with two fingers for 5 minutes. Dried blood on the steri-strips when removing the outer bandage is normal.   Keep the large square bandage on your site for 24 hours and then you may remove it yourself. Keep the steri-strips underneath in place.   You may shower after 72 hours / 3 days from your procedure with the steri-strips in place. They will usually fall off on their own, or may be removed after 10 days. Pat dry.   Avoid lotions, ointments, or perfumes over your incision until it is well-healed.  Please do not submerge in water until your site is completely healed.   Your device is MRI compatible.   Remote monitoring is used to monitor your cardiac device from home. This monitoring is scheduled every month by our office. It allows us to keep an eye on the function of your device to ensure it is working properly.    

## 2022-03-06 NOTE — Discharge Summary (Signed)
Physician Discharge Summary  Susan Marsh:[id number] DOB: March 29, 1940 DOA: 03/02/2022  PCP: [name], MD  Admit date: 03/02/2022 Discharge date: 03/06/2022  Admitted From: Home Disposition:  SNF [facility]   Recommendations for Outpatient Follow-up:  Follow up with PCP in 1-2 weeks Follow up with Neuro as scheduled Follow up with cardiology about loop recorder placement/results as scheduled  Discharge Condition:Stable  CODE STATUS:Full  Diet recommendation: Low salt low fat diet as tolerated   Brief/Interim Summary: 81 y.o. female with medical history significant of HTN, HLD, cad S/P stent, aortic stenosis. Pt last admitted from 10/29-11/3 with erratic behavior, visual hallucinations, and poor PO intake that was thought to be related to AKI, dehydration, and hyponatremia. At baseline pt fully functional, lives independently, and does all ADLs.  Presented to the ED with left-sided weakness.  Found to have acute stroke.  Hospitalized for further management.   Discharge Diagnoses:  Principal Problem:   Acute ischemic stroke [facility]) Active Problems:   Pyuria   Hypokalemia   HTN (hypertension)  Acute ischemic stroke Patient with left-sided weakness. MRI shows numerous small areas of acute ischemia in the right hemisphere and also with the left hemisphere. Patient underwent CT angiogram head and neck -unremarkable for vascular abnormalities LDL is 63.  Continue statin A1c 5.6 PT OT SLP evaluation. Continue aspirin indefinitely, Plavix x 21 days Loop recorder placed at discharge   Questionable urinary tract infection Completed ceftriaxone   Hypokalemia Within normal limits, magnesium 2.2 - no further replacement required   SVT Beta-blocker resumed, no further episodes   Essential hypertension Currently allowing permissive hypertension. Restart amlodipine losartan and metoprolol prior to discharge as indicated   Metabolic acidosis with mildly elevated anion  gap Resolving, follow repeat labs w/ PCP in 1-2 weeks   Discharge Instructions   Allergies as of 03/06/2022       Reactions   Alendronate Sodium    Other reaction(s): GI upset   Other    Cortisporin otic Other reaction(s): GI upset        Medication List     STOP taking these medications    losartan 50 MG tablet Commonly known as: COZAAR   potassium chloride SA 20 MEQ tablet Commonly known as: KLOR-CON M   ZTlido 1.8 % Ptch Generic drug: Lidocaine       TAKE these medications    acetaminophen 325 MG tablet Commonly known as: TYLENOL Take 650 mg by mouth every 6 (six) hours as needed for headache.   amLODipine 10 MG tablet Commonly known as: NORVASC Take 10 mg by mouth daily.   aspirin EC 81 MG tablet Take 1 tablet (81 mg total) by mouth daily. Swallow whole.   busPIRone 10 MG tablet Commonly known as: BUSPAR Take 5-10 mg by mouth 2 (two) times daily.   clopidogrel 75 MG tablet Commonly known as: PLAVIX Take 1 tablet (75 mg total) by mouth daily for 17 days.   famotidine 20 MG tablet Commonly known as: PEPCID Take 1 tablet (20 mg total) by mouth 2 (two) times daily.   metoprolol tartrate 50 MG tablet Commonly known as: LOPRESSOR Take 50 mg by mouth 2 (two) times daily.   ondansetron 4 MG disintegrating tablet Commonly known as: ZOFRAN-ODT Take 4 mg by mouth 3 (three) times daily.   pravastatin 40 MG tablet Commonly known as: PRAVACHOL Take 40 mg by mouth daily.        Follow-up Information     Windell Norfolk, MD. Go on 04/08/2022.  Specialty: Neurology Contact information: [address] [place] [address] [phone]                Allergies  Allergen Reactions   Alendronate Sodium     Other reaction(s): GI upset   Other     Cortisporin otic Other reaction(s): GI upset    Consultations: Neuro, EP   Procedures/Studies: VAS [place] LOWER EXTREMITY VENOUS (DVT)  Result Date: 03/03/2022  Lower Venous DVT  Study Patient Name:  STEPHENIE Marsh  Date of Exam:   03/03/2022 Medical Rec #: [id number]          Accession #:    [id number] Date of Birth: [birth year]         Patient Gender: F Patient Age:   31 years Exam Location:  [facility] Procedure:      VAS [place] LOWER EXTREMITY VENOUS (DVT) Referring Phys: Scheryl Marten XU --------------------------------------------------------------------------------  Indications: Stroke.  Comparison Study: no prior Performing Technologist: [place] Ponder RVS  Examination Guidelines: A complete evaluation includes B-mode imaging, spectral Doppler, color Doppler, and power Doppler as needed of all accessible portions of each vessel. Bilateral testing is considered an integral part of a complete examination. Limited examinations for reoccurring indications may be performed as noted. The reflux portion of the exam is performed with the patient in reverse Trendelenburg.  +---------+---------------+---------+-----------+----------+--------------+ RIGHT    CompressibilityPhasicitySpontaneityPropertiesThrombus Aging +---------+---------------+---------+-----------+----------+--------------+ CFV      Full           Yes      Yes                                 +---------+---------------+---------+-----------+----------+--------------+ SFJ      Full                                                        +---------+---------------+---------+-----------+----------+--------------+ FV Prox  Full                                                        +---------+---------------+---------+-----------+----------+--------------+ FV Mid   Full                                                        +---------+---------------+---------+-----------+----------+--------------+ FV DistalFull                                                        +---------+---------------+---------+-----------+----------+--------------+ PFV      Full                                                         +---------+---------------+---------+-----------+----------+--------------+  POP      Full           Yes      Yes                                 +---------+---------------+---------+-----------+----------+--------------+ PTV      Full                                                        +---------+---------------+---------+-----------+----------+--------------+ PERO     Full                                                        +---------+---------------+---------+-----------+----------+--------------+   +---------+---------------+---------+-----------+----------+--------------+ LEFT     CompressibilityPhasicitySpontaneityPropertiesThrombus Aging +---------+---------------+---------+-----------+----------+--------------+ CFV      Full           Yes      Yes                                 +---------+---------------+---------+-----------+----------+--------------+ SFJ      Full                                                        +---------+---------------+---------+-----------+----------+--------------+ FV Prox  Full                                                        +---------+---------------+---------+-----------+----------+--------------+ FV Mid   Full                                                        +---------+---------------+---------+-----------+----------+--------------+ FV DistalFull                                                        +---------+---------------+---------+-----------+----------+--------------+ PFV      Full                                                        +---------+---------------+---------+-----------+----------+--------------+ POP      Full           Yes      Yes                                 +---------+---------------+---------+-----------+----------+--------------+  PTV      Full                                                         +---------+---------------+---------+-----------+----------+--------------+ PERO     Full                                                        +---------+---------------+---------+-----------+----------+--------------+     Summary: BILATERAL: - No evidence of deep vein thrombosis seen in the lower extremities, bilaterally. -No evidence of popliteal cyst, bilaterally.   *See table(s) above for measurements and observations. Electronically signed by Coral Else MD on 03/03/2022 at 11:27:50 PM.    Final    ECHOCARDIOGRAM COMPLETE  Result Date: 03/03/2022    ECHOCARDIOGRAM REPORT   Patient Name:   MASIEL GENTZLER Date of Exam: 03/03/2022 Medical Rec #:  [id number]         Height:       63.0 in Accession #:    [id number]        Weight:       137.3 lb Date of Birth:  27-Dec-1940        BSA:          1.648 m Patient Age:    80 years          BP:           173/85 mmHg Patient Gender: F                 HR:           98 bpm. Exam Location:  Inpatient Procedure: 2D Echo, Color Doppler, Cardiac Doppler and Intracardiac            Opacification Agent Indications:    Stroke I63.9  History:        Patient has no prior history of Echocardiogram examinations.                 Risk Factors:Hypertension and Dyslipidemia.  Sonographer:    Leta Jungling RDCS Referring Phys: [phone] JARED M GARDNER IMPRESSIONS  1. Left ventricular ejection fraction, by estimation, is 70 to 75%. The left ventricle has hyperdynamic function. The left ventricle has no regional wall motion abnormalities. 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 Indeterminate diastolic filling due to E-A fusion. Small intracavitary gradient (14 mm Hg at peak) without LVOT obstruction.  2. Right ventricular systolic function is normal. The right ventricular size is normal.  3. The mitral valve is normal in structure. No evidence of mitral valve regurgitation. No evidence of mitral stenosis.  4. The aortic valve was not  well visualized. Aortic valve regurgitation is mild. No aortic stenosis is present. Comparison(s): No prior Echocardiogram. FINDINGS  Left Ventricle: Left ventricular ejection fraction, by estimation, is 70 to 75%. The left ventricle has hyperdynamic function. The left ventricle has no regional wall motion abnormalities. Definity contrast agent was given IV to delineate the left ventricular endocardial borders. The left ventricular internal cavity size was normal in size. 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 Indeterminate diastolic filling due to E-A fusion. Right  Ventricle: The right ventricular size is normal. No increase in right ventricular wall thickness. Right ventricular systolic function is normal. Left Atrium: Left atrial size was normal in size. Right Atrium: Right atrial size was normal in size. Pericardium: Trivial pericardial effusion is present. The pericardial effusion is anterior to the right ventricle. Presence of epicardial fat layer. Mitral Valve: The mitral valve is normal in structure. No evidence of mitral valve regurgitation. No evidence of mitral valve stenosis. Tricuspid Valve: The tricuspid valve is normal in structure. Tricuspid valve regurgitation is not demonstrated. No evidence of tricuspid stenosis. Aortic Valve: The aortic valve was not well visualized. Aortic valve regurgitation is mild. No aortic stenosis is present. Pulmonic Valve: The pulmonic valve was not well visualized. Pulmonic valve regurgitation is not visualized. No evidence of pulmonic stenosis. Aorta: The aortic root is normal in size and structure. IAS/Shunts: No atrial level shunt detected by color flow Doppler.  LEFT VENTRICLE PLAX 2D LVIDd:         2.81 cm LVIDs:         1.90 cm LV PW:         1.15 cm LV IVS:        1.87 cm LVOT diam:     2.20 cm LV SV:         62 LV SV Index:   38 LVOT Area:     3.80 cm  LV Volumes (MOD) LV vol d, MOD A2C: 48.0 ml LV vol d, MOD A4C:  83.4 ml LV vol s, MOD A2C: 14.0 ml LV vol s, MOD A4C: 29.2 ml LV SV MOD A2C:     34.0 ml LV SV MOD A4C:     83.4 ml LV SV MOD BP:      42.9 ml RIGHT VENTRICLE TAPSE (M-mode): 1.7 cm LEFT ATRIUM             Index LA diam:        3.40 cm 2.06 cm/m LA Vol (A2C):   35.0 ml 21.24 ml/m LA Vol (A4C):   35.5 ml 21.54 ml/m LA Biplane Vol: 36.5 ml 22.15 ml/m  AORTIC VALVE LVOT Vmax:   111.00 cm/s LVOT Vmean:  76.700 cm/s LVOT VTI:    0.164 m  AORTA Ao Root diam: 3.00 cm TRICUSPID VALVE TR Peak grad:   9.0 mmHg TR Vmax:        150.00 cm/s  SHUNTS Systemic VTI:  0.16 m Systemic Diam: 2.20 cm Riley LamMahesh Chandrasekhar MD Electronically signed by Riley LamMahesh Chandrasekhar MD Signature Date/Time: 03/03/2022/12:38:27 PM    Final    MR BRAIN WO CONTRAST  Result Date: 03/02/2022 CLINICAL DATA:  Acute neurologic deficit EXAM: MRI HEAD WITHOUT CONTRAST TECHNIQUE: Multiplanar, multiecho pulse sequences of the brain and surrounding structures were obtained without intravenous contrast. COMPARISON:  None Available. FINDINGS: Brain: Numerous small areas of acute ischemia throughout the right hemisphere, in multiple vascular territories. There are a few foci of acute ischemia in the left hemisphere, also in multiple vascular territories. The greatest burden is in the right MCA territory. No acute or chronic hemorrhage. There is confluent hyperintense T2-weighted signal within the white matter. Generalized volume loss. The midline structures are normal. Vascular: Major flow voids are preserved. Skull and upper cervical spine: Normal calvarium and skull base. Visualized upper cervical spine and soft tissues are normal. Sinuses/Orbits:No paranasal sinus fluid levels or advanced mucosal thickening. No mastoid or middle ear effusion. Normal orbits. IMPRESSION: 1. Numerous small areas of acute ischemia throughout the right  hemisphere, in multiple vascular territories. The greatest burden is in the right MCA territory. No hemorrhage or mass effect.  2. A few foci of acute ischemia in the left hemisphere, also in multiple vascular territories. 3. Pattern is most consistent with a central embolic source (heart or proximal aorta). Electronically Signed   By: Deatra Robinson M.D.   On: 03/02/2022 22:13   CT ANGIO HEAD NECK W WO CM W PERF (CODE STROKE)  Result Date: 03/02/2022 CLINICAL DATA:  Stroke code EXAM: CT ANGIOGRAPHY HEAD AND NECK CT PERFUSION BRAIN TECHNIQUE: Multidetector CT imaging of the head and neck was performed using the standard protocol during bolus administration of intravenous contrast. Multiplanar CT image reconstructions and MIPs were obtained to evaluate the vascular anatomy. Carotid stenosis measurements (when applicable) are obtained utilizing NASCET criteria, using the distal internal carotid diameter as the denominator. Multiphase CT imaging of the brain was performed following IV bolus contrast injection. Subsequent parametric perfusion maps were calculated using RAPID software. RADIATION DOSE REDUCTION: This exam was performed according to the departmental dose-optimization program which includes automated exposure control, adjustment of the mA and/or kV according to patient size and/or use of iterative reconstruction technique. CONTRAST:  OMNIPAQUE IOHEXOL 350 MG/ML SOLN COMPARISON:  Same-day CT brain FINDINGS: CT HEAD FINDINGS Please see same-day CT brain for intracranial findings. No contrast-enhancing lesion is visualized. CTA NECK FINDINGS Aortic arch: Standard branching. Imaged portion shows no evidence of aneurysm or dissection. No significant stenosis of the major arch vessel origins. Right carotid system: No evidence of dissection, stenosis (50% or greater) or occlusion. Mild narrowing of the origin of the right ICA. Left carotid system: No evidence of dissection, stenosis (50% or greater) or occlusion. Mild narrowing of the origin of the left ICA. Vertebral arteries: Left dominant. There is likely mild-to-moderate  stenosis of the V4 segment of the left vertebral artery. Skeleton: Multilevel degenerative changes in the cervical spine. No evidence of high-grade spinal canal stenosis. Other neck: None Upper chest: None Review of the MIP images confirms the above findings CTA HEAD FINDINGS Anterior circulation: Mild narrowing of the mid M2 segment of the right MCA (series 10, image 82). No aneurysm. Posterior circulation: Moderate stenosis in the P2 segment of the left PCA (series 10, image 83). Mild-to-moderate narrowing of the origin of the left IJ. Venous sinuses: As permitted by contrast timing, patent. Anatomic variants: None Review of the MIP images confirms the above findings CT Brain Perfusion Findings: ASPECTS: 8 CBF (<30%) Volume: 85mL Perfusion (Tmax>6.0s) volume: 17mL Mismatch Volume: 67mL Infarction Location:None IMPRESSION: 1. No intracranial large vessel occlusion. Moderate stenosis in the P2 segment of the left PCA and mild narrowing of the mid M2 segment of the right MCA. 2. Mild stenosis of the origins of bilateral ICAs. 3. No evidence of infarct core or penumbra on CT perfusion. Electronically Signed   By: Lorenza Cambridge M.D.   On: 03/02/2022 14:25   CT HEAD CODE STROKE WO CONTRAST  Result Date: 03/02/2022 CLINICAL DATA:  Code stroke.  No lateralizing information available. EXAM: CT HEAD WITHOUT CONTRAST TECHNIQUE: Contiguous axial images were obtained from the base of the skull through the vertex without intravenous contrast. RADIATION DOSE REDUCTION: This exam was performed according to the departmental dose-optimization program which includes automated exposure control, adjustment of the mA and/or kV according to patient size and/or use of iterative reconstruction technique. COMPARISON:  None Available. FINDINGS: Brain: There is an age indeterminate infarct in the right caudate head. Focal hypodensities in  the right frontal lobe (series 3, image 15, 16). There is sequela mild chronic microvascular ischemic  change. No hemorrhage. No hydrocephalus. No extra-axial fluid collection. Vascular: There is dense calcification at the right M1 M2 junction and along the sylvian and opercular segments of the distal right MCA branches. Skull: Normal. Negative for fracture or focal lesion. Sinuses/Orbits: Right lens replacement. Paranasal sinuses are clear. Other: None ASPECTS (Alberta Stroke Program Early CT Score): 8 - Ganglionic level infarction (caudate, lentiform nuclei, internal capsule, insula, M1-M3 cortex): 6 - Supraganglionic infarction (M4-M6 cortex): 2 IMPRESSION: 1. Age-indeterminate infarcts in the right caudate head and possibly the right frontal opercular region. No hemorrhage. Aspects is 8. 2. Dense calcification at the right M1/M2 junction and along the sylvian and opercular segments of the distal right MCA branches. Findings were discussed with Dr. Adela Lank on 03/02/22 at 1:26 PM. Electronically Signed   By: Lorenza Cambridge M.D.   On: 03/02/2022 13:28     Subjective: No acute issue/events overnight   Discharge Exam: Vitals:   03/06/22 0324 03/06/22 0620  BP: (!) 160/85 (!) 152/86  Pulse: 86   Resp: 19 13  Temp: 98.1 F (36.7 C)   SpO2: 98%    Vitals:   03/05/22 2008 03/05/22 2341 03/06/22 0324 03/06/22 0620  BP: (!) 176/96 (!) 166/88 (!) 160/85 (!) 152/86  Pulse: 85 73 86   Resp: Temp: 97.8 F (36.6 C) 97.7 F (36.5 C) 98.1 F (36.7 C)   TempSrc: Oral Oral Oral   SpO2: 98% 99% 98%   Weight:      Height:        General: Pt is alert, awake, not in acute distress Cardiovascular: RRR, S1/S2 +, no rubs, no gallops Respiratory: CTA bilaterally, no wheezing, no rhonchi Abdominal: Soft, NT, ND, bowel sounds + Extremities: no edema, no cyanosis    The results of significant diagnostics from this hospitalization (including imaging, microbiology, ancillary and laboratory) are listed below for reference.     Microbiology: Recent Results (from the past 240 hour(s))  Urine  Culture     Status: Abnormal   Collection Time: 03/03/22 12:55 AM   Specimen: Urine, Clean Catch  Result Value Ref Range Status   Specimen Description URINE, CLEAN CATCH  Final   Special Requests   Final    NONE Performed at [facility], [address] [place], [place] [address]    Culture MULTIPLE SPECIES PRESENT, SUGGEST RECOLLECTION (A)  Final   Report Status 03/04/2022 FINAL  Final     Labs: BNP (last 3 results) No results for input(s): "BNP" in the last 8760 hours. Basic Metabolic Panel: Recent Labs  Lab 03/02/22 1305 03/02/22 1415 03/03/22 0305 03/04/22 0359  NA 142  --  142 138  K 2.7*  --  3.4* 3.9  CL 105  --  109 108  CO2 23  --  16* 21*  GLUCOSE 125*  --  120* 108*  BUN 25*  --  18 28*  CREATININE 0.82  --  0.65 0.82  CALCIUM 10.5*  --  9.5 9.1  MG  --  1.9  --  2.2   Liver Function Tests: Recent Labs  Lab 03/02/22 1305  AST 20  ALT 28  ALKPHOS 67  BILITOT 1.1  PROT 9.4*  ALBUMIN 5.2*   No results for input(s): "LIPASE", "AMYLASE" in the last 168 hours. No results for input(s): "AMMONIA" in the last 168 hours. CBC: Recent Labs  Lab 03/02/22 1305 03/04/22 0359  WBC 10.7* 7.5  NEUTROABS 7.8*  --   HGB 16.1* 13.2  HCT 46.5* 37.5  MCV 86.8 87.4  PLT 305 229   Cardiac Enzymes: No results for input(s): "CKTOTAL", "CKMB", "CKMBINDEX", "TROPONINI" in the last 168 hours. BNP: Invalid input(s): "POCBNP" CBG: Recent Labs  Lab 03/02/22 1339 03/03/22 1217 03/03/22 1736 03/03/22 2143  GLUCAP 101* 108* 103* 110*   D-Dimer No results for input(s): "DDIMER" in the last 72 hours. Hgb A1c No results for input(s): "HGBA1C" in the last 72 hours. Lipid Profile No results for input(s): "CHOL", "HDL", "LDLCALC", "TRIG", "CHOLHDL", "LDLDIRECT" in the last 72 hours. Thyroid function studies No results for input(s): "TSH", "T4TOTAL", "T3FREE", "THYROIDAB" in the last 72 hours.  Invalid input(s): "FREET3" Anemia work up No results for input(s):  "VITAMINB12", "FOLATE", "FERRITIN", "TIBC", "IRON", "RETICCTPCT" in the last 72 hours. Urinalysis    Component Value Date/Time   COLORURINE YELLOW 03/02/2022 1415   APPEARANCEUR CLEAR 03/02/2022 1415   LABSPEC 1.034 (H) 03/02/2022 1415   PHURINE 7.0 03/02/2022 1415   GLUCOSEU NEGATIVE 03/02/2022 1415   HGBUR NEGATIVE 03/02/2022 1415   BILIRUBINUR NEGATIVE 03/02/2022 1415   KETONESUR NEGATIVE 03/02/2022 1415   PROTEINUR TRACE (A) 03/02/2022 1415   NITRITE NEGATIVE 03/02/2022 1415   LEUKOCYTESUR LARGE (A) 03/02/2022 1415   Sepsis Labs Recent Labs  Lab 03/02/22 1305 03/04/22 0359  WBC 10.7* 7.5   Microbiology Recent Results (from the past 240 hour(s))  Urine Culture     Status: Abnormal   Collection Time: 03/03/22 12:55 AM   Specimen: Urine, Clean Catch  Result Value Ref Range Status   Specimen Description URINE, CLEAN CATCH  Final   Special Requests   Final    NONE Performed at [facility], [address], [place], [place] [address]    Culture MULTIPLE SPECIES PRESENT, SUGGEST RECOLLECTION (A)  Final   Report Status 03/04/2022 FINAL  Final     Time coordinating discharge: Over 30 minutes  SIGNED:   Azucena Fallen, DO [facility] 03/06/2022, 8:52 AM Pager   If 7PM-7AM, please contact night-coverage [email or web address]

## 2022-03-06 NOTE — Plan of Care (Signed)
  Problem: Education: Goal: Knowledge of General Education information will improve Description: Including pain rating scale, medication(s)/side effects and non-pharmacologic comfort measures Outcome: Adequate for Discharge   Problem: Health Behavior/Discharge Planning: Goal: Ability to manage health-related needs will improve Outcome: Adequate for Discharge   Problem: Clinical Measurements: Goal: Ability to maintain clinical measurements within normal limits will improve Outcome: Adequate for Discharge Goal: Will remain free from infection Outcome: Adequate for Discharge Goal: Diagnostic test results will improve Outcome: Adequate for Discharge Goal: Respiratory complications will improve Outcome: Adequate for Discharge Goal: Cardiovascular complication will be avoided Outcome: Adequate for Discharge   Problem: Activity: Goal: Risk for activity intolerance will decrease Outcome: Adequate for Discharge   Problem: Nutrition: Goal: Adequate nutrition will be maintained Outcome: Adequate for Discharge   Problem: Coping: Goal: Level of anxiety will decrease Outcome: Adequate for Discharge   Problem: Elimination: Goal: Will not experience complications related to bowel motility Outcome: Adequate for Discharge Goal: Will not experience complications related to urinary retention Outcome: Adequate for Discharge   Problem: Pain Managment: Goal: General experience of comfort will improve Outcome: Adequate for Discharge   Problem: Safety: Goal: Ability to remain free from injury will improve Outcome: Adequate for Discharge   Problem: Skin Integrity: Goal: Risk for impaired skin integrity will decrease Outcome: Adequate for Discharge   Problem: Education: Goal: Knowledge of disease or condition will improve Outcome: Adequate for Discharge Goal: Knowledge of secondary prevention will improve (MUST DOCUMENT ALL) Outcome: Adequate for Discharge Goal: Knowledge of patient  specific risk factors will improve (Mark N/A or DELETE if not current risk factor) Outcome: Adequate for Discharge   Problem: Ischemic Stroke/TIA Tissue Perfusion: Goal: Complications of ischemic stroke/TIA will be minimized Outcome: Adequate for Discharge   Problem: Coping: Goal: Will verbalize positive feelings about self Outcome: Adequate for Discharge Goal: Will identify appropriate support needs Outcome: Adequate for Discharge   Problem: Health Behavior/Discharge Planning: Goal: Ability to manage health-related needs will improve Outcome: Adequate for Discharge Goal: Goals will be collaboratively established with patient/family Outcome: Adequate for Discharge   Problem: Self-Care: Goal: Ability to participate in self-care as condition permits will improve Outcome: Adequate for Discharge Goal: Verbalization of feelings and concerns over difficulty with self-care will improve Outcome: Adequate for Discharge Goal: Ability to communicate needs accurately will improve Outcome: Adequate for Discharge   Problem: Nutrition: Goal: Risk of aspiration will decrease Outcome: Adequate for Discharge Goal: Dietary intake will improve Outcome: Adequate for Discharge   

## 2022-03-06 NOTE — Progress Notes (Signed)
This chaplain responded to unit page for facilitating notarizing of the Pt. Advance Directive: HCPOA.  The Pt. participated in AD education on Thursday and was able to answer this chaplain's clarifying questions today.  The chaplain is present with the Pt., RN-Ty, notary, and witnesses. The Pt. chose Bridgett Larsson as her healthcare agent. The Pt. next choice is [name], if the Pt. healthcare agent is unable or unwilling to serve in this role.  The Pt. did not complete a Living Will.  The chaplain gave the Pt. the original AD along with two copies. The chaplain scanned the Pt. AD into the Pt. EMR.  The chaplain is available for F/U spiritual care as needed.  Chaplain Stephanie Acre [phone]

## 2022-03-09 ENCOUNTER — Encounter (HOSPITAL_COMMUNITY): Payer: Self-pay | Admitting: Internal Medicine

## 2022-03-10 ENCOUNTER — Telehealth: Payer: Self-pay

## 2022-03-10 DIAGNOSIS — M6281 Muscle weakness (generalized): Secondary | ICD-10-CM | POA: Diagnosis not present

## 2022-03-10 DIAGNOSIS — I69854 Hemiplegia and hemiparesis following other cerebrovascular disease affecting left non-dominant side: Secondary | ICD-10-CM | POA: Diagnosis not present

## 2022-03-10 DIAGNOSIS — I639 Cerebral infarction, unspecified: Secondary | ICD-10-CM | POA: Diagnosis not present

## 2022-03-10 DIAGNOSIS — G8194 Hemiplegia, unspecified affecting left nondominant side: Secondary | ICD-10-CM | POA: Diagnosis not present

## 2022-03-10 DIAGNOSIS — K2100 Gastro-esophageal reflux disease with esophagitis, without bleeding: Secondary | ICD-10-CM | POA: Diagnosis not present

## 2022-03-10 DIAGNOSIS — E876 Hypokalemia: Secondary | ICD-10-CM | POA: Diagnosis not present

## 2022-03-10 DIAGNOSIS — I6990 Unspecified sequelae of unspecified cerebrovascular disease: Secondary | ICD-10-CM | POA: Diagnosis not present

## 2022-03-10 DIAGNOSIS — I10 Essential (primary) hypertension: Secondary | ICD-10-CM | POA: Diagnosis not present

## 2022-03-10 DIAGNOSIS — I2510 Atherosclerotic heart disease of native coronary artery without angina pectoris: Secondary | ICD-10-CM | POA: Diagnosis not present

## 2022-03-10 DIAGNOSIS — I350 Nonrheumatic aortic (valve) stenosis: Secondary | ICD-10-CM | POA: Diagnosis not present

## 2022-03-10 DIAGNOSIS — E785 Hyperlipidemia, unspecified: Secondary | ICD-10-CM | POA: Diagnosis not present

## 2022-03-10 DIAGNOSIS — E782 Mixed hyperlipidemia: Secondary | ICD-10-CM | POA: Diagnosis not present

## 2022-03-10 DIAGNOSIS — I471 Supraventricular tachycardia, unspecified: Secondary | ICD-10-CM | POA: Diagnosis not present

## 2022-03-10 DIAGNOSIS — M1990 Unspecified osteoarthritis, unspecified site: Secondary | ICD-10-CM | POA: Diagnosis not present

## 2022-03-10 DIAGNOSIS — R2681 Unsteadiness on feet: Secondary | ICD-10-CM | POA: Diagnosis not present

## 2022-03-10 NOTE — Telephone Encounter (Signed)
-----   Message from Graciella Freer, PA-C sent at 03/06/2022  3:56 PM EST ----- Regarding: Same Day Discharge LOOP 03/06/22 Dr. Graciela Husbands

## 2022-03-10 NOTE — Telephone Encounter (Signed)
Attempted to call patient to complete same day discharge. No answer, unable to leave VM d/t phone continuously ringing.   My chart is pending so unable to send message.

## 2022-03-11 DIAGNOSIS — E782 Mixed hyperlipidemia: Secondary | ICD-10-CM | POA: Diagnosis not present

## 2022-03-11 DIAGNOSIS — M6281 Muscle weakness (generalized): Secondary | ICD-10-CM | POA: Diagnosis not present

## 2022-03-11 DIAGNOSIS — K2100 Gastro-esophageal reflux disease with esophagitis, without bleeding: Secondary | ICD-10-CM | POA: Diagnosis not present

## 2022-03-11 DIAGNOSIS — I6990 Unspecified sequelae of unspecified cerebrovascular disease: Secondary | ICD-10-CM | POA: Diagnosis not present

## 2022-03-11 DIAGNOSIS — I10 Essential (primary) hypertension: Secondary | ICD-10-CM | POA: Diagnosis not present

## 2022-03-11 DIAGNOSIS — E876 Hypokalemia: Secondary | ICD-10-CM | POA: Diagnosis not present

## 2022-03-11 DIAGNOSIS — I69854 Hemiplegia and hemiparesis following other cerebrovascular disease affecting left non-dominant side: Secondary | ICD-10-CM | POA: Diagnosis not present

## 2022-03-11 DIAGNOSIS — I471 Supraventricular tachycardia, unspecified: Secondary | ICD-10-CM | POA: Diagnosis not present

## 2022-03-11 NOTE — Telephone Encounter (Signed)
Spoke with patients daughter, have not received any data from patients monitor, she stated that at [facility] place they told her they couldn't touch the home monitor, so they took the monitor back home, patients daughter stated they would go and take the home monitor over there and attempt to set up the monitor, direct number to device clinic given for help with monitor and to make sure data was sent. Patients daughter appreciative of call

## 2022-03-13 DIAGNOSIS — I2510 Atherosclerotic heart disease of native coronary artery without angina pectoris: Secondary | ICD-10-CM | POA: Diagnosis not present

## 2022-03-13 DIAGNOSIS — I350 Nonrheumatic aortic (valve) stenosis: Secondary | ICD-10-CM | POA: Diagnosis not present

## 2022-03-13 DIAGNOSIS — E785 Hyperlipidemia, unspecified: Secondary | ICD-10-CM | POA: Diagnosis not present

## 2022-03-13 DIAGNOSIS — M6281 Muscle weakness (generalized): Secondary | ICD-10-CM | POA: Diagnosis not present

## 2022-03-13 DIAGNOSIS — I10 Essential (primary) hypertension: Secondary | ICD-10-CM | POA: Diagnosis not present

## 2022-03-13 DIAGNOSIS — I6990 Unspecified sequelae of unspecified cerebrovascular disease: Secondary | ICD-10-CM | POA: Diagnosis not present

## 2022-03-13 DIAGNOSIS — E876 Hypokalemia: Secondary | ICD-10-CM | POA: Diagnosis not present

## 2022-03-13 DIAGNOSIS — K2100 Gastro-esophageal reflux disease with esophagitis, without bleeding: Secondary | ICD-10-CM | POA: Diagnosis not present

## 2022-03-13 DIAGNOSIS — I471 Supraventricular tachycardia, unspecified: Secondary | ICD-10-CM | POA: Diagnosis not present

## 2022-03-13 DIAGNOSIS — I69854 Hemiplegia and hemiparesis following other cerebrovascular disease affecting left non-dominant side: Secondary | ICD-10-CM | POA: Diagnosis not present

## 2022-03-13 DIAGNOSIS — R2681 Unsteadiness on feet: Secondary | ICD-10-CM | POA: Diagnosis not present

## 2022-03-13 DIAGNOSIS — K641 Second degree hemorrhoids: Secondary | ICD-10-CM | POA: Diagnosis not present

## 2022-03-13 DIAGNOSIS — M1990 Unspecified osteoarthritis, unspecified site: Secondary | ICD-10-CM | POA: Diagnosis not present

## 2022-03-13 DIAGNOSIS — I639 Cerebral infarction, unspecified: Secondary | ICD-10-CM | POA: Diagnosis not present

## 2022-03-13 DIAGNOSIS — E782 Mixed hyperlipidemia: Secondary | ICD-10-CM | POA: Diagnosis not present

## 2022-03-13 DIAGNOSIS — G8194 Hemiplegia, unspecified affecting left nondominant side: Secondary | ICD-10-CM | POA: Diagnosis not present

## 2022-03-17 DIAGNOSIS — E876 Hypokalemia: Secondary | ICD-10-CM | POA: Diagnosis not present

## 2022-03-17 DIAGNOSIS — M6281 Muscle weakness (generalized): Secondary | ICD-10-CM | POA: Diagnosis not present

## 2022-03-17 DIAGNOSIS — M1990 Unspecified osteoarthritis, unspecified site: Secondary | ICD-10-CM | POA: Diagnosis not present

## 2022-03-17 DIAGNOSIS — K2100 Gastro-esophageal reflux disease with esophagitis, without bleeding: Secondary | ICD-10-CM | POA: Diagnosis not present

## 2022-03-17 DIAGNOSIS — I639 Cerebral infarction, unspecified: Secondary | ICD-10-CM | POA: Diagnosis not present

## 2022-03-17 DIAGNOSIS — I2510 Atherosclerotic heart disease of native coronary artery without angina pectoris: Secondary | ICD-10-CM | POA: Diagnosis not present

## 2022-03-17 DIAGNOSIS — I471 Supraventricular tachycardia, unspecified: Secondary | ICD-10-CM | POA: Diagnosis not present

## 2022-03-17 DIAGNOSIS — E785 Hyperlipidemia, unspecified: Secondary | ICD-10-CM | POA: Diagnosis not present

## 2022-03-17 DIAGNOSIS — R2681 Unsteadiness on feet: Secondary | ICD-10-CM | POA: Diagnosis not present

## 2022-03-17 DIAGNOSIS — I350 Nonrheumatic aortic (valve) stenosis: Secondary | ICD-10-CM | POA: Diagnosis not present

## 2022-03-17 DIAGNOSIS — I10 Essential (primary) hypertension: Secondary | ICD-10-CM | POA: Diagnosis not present

## 2022-03-17 DIAGNOSIS — K5901 Slow transit constipation: Secondary | ICD-10-CM | POA: Diagnosis not present

## 2022-03-17 DIAGNOSIS — E782 Mixed hyperlipidemia: Secondary | ICD-10-CM | POA: Diagnosis not present

## 2022-03-17 DIAGNOSIS — I69854 Hemiplegia and hemiparesis following other cerebrovascular disease affecting left non-dominant side: Secondary | ICD-10-CM | POA: Diagnosis not present

## 2022-03-17 DIAGNOSIS — G8194 Hemiplegia, unspecified affecting left nondominant side: Secondary | ICD-10-CM | POA: Diagnosis not present

## 2022-03-18 DIAGNOSIS — K5901 Slow transit constipation: Secondary | ICD-10-CM | POA: Diagnosis not present

## 2022-03-18 DIAGNOSIS — M1990 Unspecified osteoarthritis, unspecified site: Secondary | ICD-10-CM | POA: Diagnosis not present

## 2022-03-18 DIAGNOSIS — E785 Hyperlipidemia, unspecified: Secondary | ICD-10-CM | POA: Diagnosis not present

## 2022-03-20 DIAGNOSIS — E876 Hypokalemia: Secondary | ICD-10-CM | POA: Diagnosis not present

## 2022-03-20 DIAGNOSIS — R2681 Unsteadiness on feet: Secondary | ICD-10-CM | POA: Diagnosis not present

## 2022-03-20 DIAGNOSIS — I10 Essential (primary) hypertension: Secondary | ICD-10-CM | POA: Diagnosis not present

## 2022-03-20 DIAGNOSIS — I639 Cerebral infarction, unspecified: Secondary | ICD-10-CM | POA: Diagnosis not present

## 2022-03-20 DIAGNOSIS — M6281 Muscle weakness (generalized): Secondary | ICD-10-CM | POA: Diagnosis not present

## 2022-03-20 DIAGNOSIS — I350 Nonrheumatic aortic (valve) stenosis: Secondary | ICD-10-CM | POA: Diagnosis not present

## 2022-03-20 DIAGNOSIS — K5901 Slow transit constipation: Secondary | ICD-10-CM | POA: Diagnosis not present

## 2022-03-20 DIAGNOSIS — I6990 Unspecified sequelae of unspecified cerebrovascular disease: Secondary | ICD-10-CM | POA: Diagnosis not present

## 2022-03-20 DIAGNOSIS — I471 Supraventricular tachycardia, unspecified: Secondary | ICD-10-CM | POA: Diagnosis not present

## 2022-03-20 DIAGNOSIS — I2510 Atherosclerotic heart disease of native coronary artery without angina pectoris: Secondary | ICD-10-CM | POA: Diagnosis not present

## 2022-03-20 DIAGNOSIS — M1990 Unspecified osteoarthritis, unspecified site: Secondary | ICD-10-CM | POA: Diagnosis not present

## 2022-03-20 DIAGNOSIS — G8194 Hemiplegia, unspecified affecting left nondominant side: Secondary | ICD-10-CM | POA: Diagnosis not present

## 2022-03-20 DIAGNOSIS — K2100 Gastro-esophageal reflux disease with esophagitis, without bleeding: Secondary | ICD-10-CM | POA: Diagnosis not present

## 2022-03-20 DIAGNOSIS — I69854 Hemiplegia and hemiparesis following other cerebrovascular disease affecting left non-dominant side: Secondary | ICD-10-CM | POA: Diagnosis not present

## 2022-03-20 DIAGNOSIS — E785 Hyperlipidemia, unspecified: Secondary | ICD-10-CM | POA: Diagnosis not present

## 2022-03-20 DIAGNOSIS — E782 Mixed hyperlipidemia: Secondary | ICD-10-CM | POA: Diagnosis not present

## 2022-03-24 DIAGNOSIS — G8194 Hemiplegia, unspecified affecting left nondominant side: Secondary | ICD-10-CM | POA: Diagnosis not present

## 2022-03-24 DIAGNOSIS — R2681 Unsteadiness on feet: Secondary | ICD-10-CM | POA: Diagnosis not present

## 2022-03-24 DIAGNOSIS — I639 Cerebral infarction, unspecified: Secondary | ICD-10-CM | POA: Diagnosis not present

## 2022-03-24 DIAGNOSIS — I2510 Atherosclerotic heart disease of native coronary artery without angina pectoris: Secondary | ICD-10-CM | POA: Diagnosis not present

## 2022-03-24 DIAGNOSIS — E785 Hyperlipidemia, unspecified: Secondary | ICD-10-CM | POA: Diagnosis not present

## 2022-03-24 DIAGNOSIS — I350 Nonrheumatic aortic (valve) stenosis: Secondary | ICD-10-CM | POA: Diagnosis not present

## 2022-03-24 DIAGNOSIS — I10 Essential (primary) hypertension: Secondary | ICD-10-CM | POA: Diagnosis not present

## 2022-03-24 DIAGNOSIS — M1990 Unspecified osteoarthritis, unspecified site: Secondary | ICD-10-CM | POA: Diagnosis not present

## 2022-03-25 DIAGNOSIS — E782 Mixed hyperlipidemia: Secondary | ICD-10-CM | POA: Diagnosis not present

## 2022-03-25 DIAGNOSIS — I471 Supraventricular tachycardia, unspecified: Secondary | ICD-10-CM | POA: Diagnosis not present

## 2022-03-25 DIAGNOSIS — K2100 Gastro-esophageal reflux disease with esophagitis, without bleeding: Secondary | ICD-10-CM | POA: Diagnosis not present

## 2022-03-25 DIAGNOSIS — I10 Essential (primary) hypertension: Secondary | ICD-10-CM | POA: Diagnosis not present

## 2022-03-25 DIAGNOSIS — K5901 Slow transit constipation: Secondary | ICD-10-CM | POA: Diagnosis not present

## 2022-03-25 DIAGNOSIS — M6281 Muscle weakness (generalized): Secondary | ICD-10-CM | POA: Diagnosis not present

## 2022-03-25 DIAGNOSIS — E876 Hypokalemia: Secondary | ICD-10-CM | POA: Diagnosis not present

## 2022-03-25 DIAGNOSIS — I69854 Hemiplegia and hemiparesis following other cerebrovascular disease affecting left non-dominant side: Secondary | ICD-10-CM | POA: Diagnosis not present

## 2022-03-25 DIAGNOSIS — I6990 Unspecified sequelae of unspecified cerebrovascular disease: Secondary | ICD-10-CM | POA: Diagnosis not present

## 2022-03-27 DIAGNOSIS — I639 Cerebral infarction, unspecified: Secondary | ICD-10-CM | POA: Diagnosis not present

## 2022-03-27 DIAGNOSIS — M1990 Unspecified osteoarthritis, unspecified site: Secondary | ICD-10-CM | POA: Diagnosis not present

## 2022-03-27 DIAGNOSIS — R2681 Unsteadiness on feet: Secondary | ICD-10-CM | POA: Diagnosis not present

## 2022-03-27 DIAGNOSIS — I350 Nonrheumatic aortic (valve) stenosis: Secondary | ICD-10-CM | POA: Diagnosis not present

## 2022-03-27 DIAGNOSIS — E785 Hyperlipidemia, unspecified: Secondary | ICD-10-CM | POA: Diagnosis not present

## 2022-03-27 DIAGNOSIS — I2510 Atherosclerotic heart disease of native coronary artery without angina pectoris: Secondary | ICD-10-CM | POA: Diagnosis not present

## 2022-03-27 DIAGNOSIS — I10 Essential (primary) hypertension: Secondary | ICD-10-CM | POA: Diagnosis not present

## 2022-03-27 DIAGNOSIS — G8194 Hemiplegia, unspecified affecting left nondominant side: Secondary | ICD-10-CM | POA: Diagnosis not present

## 2022-03-28 DIAGNOSIS — R062 Wheezing: Secondary | ICD-10-CM | POA: Diagnosis not present

## 2022-03-28 DIAGNOSIS — J189 Pneumonia, unspecified organism: Secondary | ICD-10-CM | POA: Diagnosis not present

## 2022-03-30 DIAGNOSIS — I6990 Unspecified sequelae of unspecified cerebrovascular disease: Secondary | ICD-10-CM | POA: Diagnosis not present

## 2022-03-30 DIAGNOSIS — I471 Supraventricular tachycardia, unspecified: Secondary | ICD-10-CM | POA: Diagnosis not present

## 2022-03-30 DIAGNOSIS — I69854 Hemiplegia and hemiparesis following other cerebrovascular disease affecting left non-dominant side: Secondary | ICD-10-CM | POA: Diagnosis not present

## 2022-03-30 DIAGNOSIS — M6281 Muscle weakness (generalized): Secondary | ICD-10-CM | POA: Diagnosis not present

## 2022-03-30 DIAGNOSIS — K2100 Gastro-esophageal reflux disease with esophagitis, without bleeding: Secondary | ICD-10-CM | POA: Diagnosis not present

## 2022-03-30 DIAGNOSIS — I10 Essential (primary) hypertension: Secondary | ICD-10-CM | POA: Diagnosis not present

## 2022-03-30 DIAGNOSIS — E782 Mixed hyperlipidemia: Secondary | ICD-10-CM | POA: Diagnosis not present

## 2022-03-30 DIAGNOSIS — K5901 Slow transit constipation: Secondary | ICD-10-CM | POA: Diagnosis not present

## 2022-03-30 DIAGNOSIS — E876 Hypokalemia: Secondary | ICD-10-CM | POA: Diagnosis not present

## 2022-03-31 DIAGNOSIS — I639 Cerebral infarction, unspecified: Secondary | ICD-10-CM | POA: Diagnosis not present

## 2022-03-31 DIAGNOSIS — R2681 Unsteadiness on feet: Secondary | ICD-10-CM | POA: Diagnosis not present

## 2022-03-31 DIAGNOSIS — I10 Essential (primary) hypertension: Secondary | ICD-10-CM | POA: Diagnosis not present

## 2022-03-31 DIAGNOSIS — G8194 Hemiplegia, unspecified affecting left nondominant side: Secondary | ICD-10-CM | POA: Diagnosis not present

## 2022-03-31 DIAGNOSIS — I350 Nonrheumatic aortic (valve) stenosis: Secondary | ICD-10-CM | POA: Diagnosis not present

## 2022-03-31 DIAGNOSIS — E785 Hyperlipidemia, unspecified: Secondary | ICD-10-CM | POA: Diagnosis not present

## 2022-03-31 DIAGNOSIS — M1990 Unspecified osteoarthritis, unspecified site: Secondary | ICD-10-CM | POA: Diagnosis not present

## 2022-03-31 DIAGNOSIS — I2510 Atherosclerotic heart disease of native coronary artery without angina pectoris: Secondary | ICD-10-CM | POA: Diagnosis not present

## 2022-04-03 DIAGNOSIS — I10 Essential (primary) hypertension: Secondary | ICD-10-CM | POA: Diagnosis not present

## 2022-04-03 DIAGNOSIS — R2681 Unsteadiness on feet: Secondary | ICD-10-CM | POA: Diagnosis not present

## 2022-04-03 DIAGNOSIS — I350 Nonrheumatic aortic (valve) stenosis: Secondary | ICD-10-CM | POA: Diagnosis not present

## 2022-04-03 DIAGNOSIS — M1990 Unspecified osteoarthritis, unspecified site: Secondary | ICD-10-CM | POA: Diagnosis not present

## 2022-04-03 DIAGNOSIS — E785 Hyperlipidemia, unspecified: Secondary | ICD-10-CM | POA: Diagnosis not present

## 2022-04-03 DIAGNOSIS — I2510 Atherosclerotic heart disease of native coronary artery without angina pectoris: Secondary | ICD-10-CM | POA: Diagnosis not present

## 2022-04-03 DIAGNOSIS — I639 Cerebral infarction, unspecified: Secondary | ICD-10-CM | POA: Diagnosis not present

## 2022-04-03 DIAGNOSIS — G8194 Hemiplegia, unspecified affecting left nondominant side: Secondary | ICD-10-CM | POA: Diagnosis not present

## 2022-04-07 DIAGNOSIS — R2681 Unsteadiness on feet: Secondary | ICD-10-CM | POA: Diagnosis not present

## 2022-04-07 DIAGNOSIS — I2510 Atherosclerotic heart disease of native coronary artery without angina pectoris: Secondary | ICD-10-CM | POA: Diagnosis not present

## 2022-04-07 DIAGNOSIS — I350 Nonrheumatic aortic (valve) stenosis: Secondary | ICD-10-CM | POA: Diagnosis not present

## 2022-04-07 DIAGNOSIS — I639 Cerebral infarction, unspecified: Secondary | ICD-10-CM | POA: Diagnosis not present

## 2022-04-07 DIAGNOSIS — I10 Essential (primary) hypertension: Secondary | ICD-10-CM | POA: Diagnosis not present

## 2022-04-07 DIAGNOSIS — M1990 Unspecified osteoarthritis, unspecified site: Secondary | ICD-10-CM | POA: Diagnosis not present

## 2022-04-07 DIAGNOSIS — E785 Hyperlipidemia, unspecified: Secondary | ICD-10-CM | POA: Diagnosis not present

## 2022-04-07 DIAGNOSIS — G8194 Hemiplegia, unspecified affecting left nondominant side: Secondary | ICD-10-CM | POA: Diagnosis not present

## 2022-04-08 ENCOUNTER — Ambulatory Visit: Payer: Medicare HMO | Admitting: Neurology

## 2022-04-09 DIAGNOSIS — R296 Repeated falls: Secondary | ICD-10-CM | POA: Diagnosis not present

## 2022-04-10 DIAGNOSIS — I2510 Atherosclerotic heart disease of native coronary artery without angina pectoris: Secondary | ICD-10-CM | POA: Diagnosis not present

## 2022-04-10 DIAGNOSIS — E785 Hyperlipidemia, unspecified: Secondary | ICD-10-CM | POA: Diagnosis not present

## 2022-04-10 DIAGNOSIS — I639 Cerebral infarction, unspecified: Secondary | ICD-10-CM | POA: Diagnosis not present

## 2022-04-10 DIAGNOSIS — R2681 Unsteadiness on feet: Secondary | ICD-10-CM | POA: Diagnosis not present

## 2022-04-10 DIAGNOSIS — G8194 Hemiplegia, unspecified affecting left nondominant side: Secondary | ICD-10-CM | POA: Diagnosis not present

## 2022-04-10 DIAGNOSIS — I350 Nonrheumatic aortic (valve) stenosis: Secondary | ICD-10-CM | POA: Diagnosis not present

## 2022-04-10 DIAGNOSIS — M1990 Unspecified osteoarthritis, unspecified site: Secondary | ICD-10-CM | POA: Diagnosis not present

## 2022-04-10 DIAGNOSIS — I10 Essential (primary) hypertension: Secondary | ICD-10-CM | POA: Diagnosis not present

## 2022-04-14 ENCOUNTER — Ambulatory Visit: Payer: Medicare HMO | Attending: Internal Medicine

## 2022-04-14 DIAGNOSIS — R2681 Unsteadiness on feet: Secondary | ICD-10-CM | POA: Diagnosis not present

## 2022-04-14 DIAGNOSIS — E785 Hyperlipidemia, unspecified: Secondary | ICD-10-CM | POA: Diagnosis not present

## 2022-04-14 DIAGNOSIS — I350 Nonrheumatic aortic (valve) stenosis: Secondary | ICD-10-CM | POA: Diagnosis not present

## 2022-04-14 DIAGNOSIS — I639 Cerebral infarction, unspecified: Secondary | ICD-10-CM

## 2022-04-14 DIAGNOSIS — K5901 Slow transit constipation: Secondary | ICD-10-CM | POA: Diagnosis not present

## 2022-04-14 DIAGNOSIS — I69854 Hemiplegia and hemiparesis following other cerebrovascular disease affecting left non-dominant side: Secondary | ICD-10-CM | POA: Diagnosis not present

## 2022-04-14 DIAGNOSIS — G8194 Hemiplegia, unspecified affecting left nondominant side: Secondary | ICD-10-CM | POA: Diagnosis not present

## 2022-04-14 DIAGNOSIS — I2510 Atherosclerotic heart disease of native coronary artery without angina pectoris: Secondary | ICD-10-CM | POA: Diagnosis not present

## 2022-04-14 DIAGNOSIS — K2100 Gastro-esophageal reflux disease with esophagitis, without bleeding: Secondary | ICD-10-CM | POA: Diagnosis not present

## 2022-04-14 DIAGNOSIS — M1990 Unspecified osteoarthritis, unspecified site: Secondary | ICD-10-CM | POA: Diagnosis not present

## 2022-04-14 DIAGNOSIS — M6281 Muscle weakness (generalized): Secondary | ICD-10-CM | POA: Diagnosis not present

## 2022-04-14 DIAGNOSIS — I10 Essential (primary) hypertension: Secondary | ICD-10-CM | POA: Diagnosis not present

## 2022-04-15 LAB — CUP PACEART REMOTE DEVICE CHECK
Date Time Interrogation Session: 20240123170109
Implantable Pulse Generator Implant Date: 20231215

## 2022-04-16 DIAGNOSIS — K2100 Gastro-esophageal reflux disease with esophagitis, without bleeding: Secondary | ICD-10-CM | POA: Diagnosis not present

## 2022-04-16 DIAGNOSIS — I471 Supraventricular tachycardia, unspecified: Secondary | ICD-10-CM | POA: Diagnosis not present

## 2022-04-16 DIAGNOSIS — M6281 Muscle weakness (generalized): Secondary | ICD-10-CM | POA: Diagnosis not present

## 2022-04-16 DIAGNOSIS — E782 Mixed hyperlipidemia: Secondary | ICD-10-CM | POA: Diagnosis not present

## 2022-04-16 DIAGNOSIS — I6990 Unspecified sequelae of unspecified cerebrovascular disease: Secondary | ICD-10-CM | POA: Diagnosis not present

## 2022-04-16 DIAGNOSIS — I10 Essential (primary) hypertension: Secondary | ICD-10-CM | POA: Diagnosis not present

## 2022-04-16 DIAGNOSIS — J189 Pneumonia, unspecified organism: Secondary | ICD-10-CM | POA: Diagnosis not present

## 2022-04-16 DIAGNOSIS — E876 Hypokalemia: Secondary | ICD-10-CM | POA: Diagnosis not present

## 2022-04-16 DIAGNOSIS — K5901 Slow transit constipation: Secondary | ICD-10-CM | POA: Diagnosis not present

## 2022-04-17 DIAGNOSIS — I350 Nonrheumatic aortic (valve) stenosis: Secondary | ICD-10-CM | POA: Diagnosis not present

## 2022-04-17 DIAGNOSIS — G8194 Hemiplegia, unspecified affecting left nondominant side: Secondary | ICD-10-CM | POA: Diagnosis not present

## 2022-04-17 DIAGNOSIS — I639 Cerebral infarction, unspecified: Secondary | ICD-10-CM | POA: Diagnosis not present

## 2022-04-17 DIAGNOSIS — E785 Hyperlipidemia, unspecified: Secondary | ICD-10-CM | POA: Diagnosis not present

## 2022-04-17 DIAGNOSIS — R296 Repeated falls: Secondary | ICD-10-CM | POA: Diagnosis not present

## 2022-04-17 DIAGNOSIS — I2510 Atherosclerotic heart disease of native coronary artery without angina pectoris: Secondary | ICD-10-CM | POA: Diagnosis not present

## 2022-04-17 DIAGNOSIS — M1990 Unspecified osteoarthritis, unspecified site: Secondary | ICD-10-CM | POA: Diagnosis not present

## 2022-04-17 DIAGNOSIS — R2681 Unsteadiness on feet: Secondary | ICD-10-CM | POA: Diagnosis not present

## 2022-04-17 DIAGNOSIS — I10 Essential (primary) hypertension: Secondary | ICD-10-CM | POA: Diagnosis not present

## 2022-04-21 DIAGNOSIS — R2681 Unsteadiness on feet: Secondary | ICD-10-CM | POA: Diagnosis not present

## 2022-04-21 DIAGNOSIS — I639 Cerebral infarction, unspecified: Secondary | ICD-10-CM | POA: Diagnosis not present

## 2022-04-21 DIAGNOSIS — I350 Nonrheumatic aortic (valve) stenosis: Secondary | ICD-10-CM | POA: Diagnosis not present

## 2022-04-21 DIAGNOSIS — I2510 Atherosclerotic heart disease of native coronary artery without angina pectoris: Secondary | ICD-10-CM | POA: Diagnosis not present

## 2022-04-21 DIAGNOSIS — E785 Hyperlipidemia, unspecified: Secondary | ICD-10-CM | POA: Diagnosis not present

## 2022-04-21 DIAGNOSIS — I10 Essential (primary) hypertension: Secondary | ICD-10-CM | POA: Diagnosis not present

## 2022-04-21 DIAGNOSIS — G8194 Hemiplegia, unspecified affecting left nondominant side: Secondary | ICD-10-CM | POA: Diagnosis not present

## 2022-04-21 DIAGNOSIS — M1990 Unspecified osteoarthritis, unspecified site: Secondary | ICD-10-CM | POA: Diagnosis not present

## 2022-04-24 DIAGNOSIS — R2681 Unsteadiness on feet: Secondary | ICD-10-CM | POA: Diagnosis not present

## 2022-04-24 DIAGNOSIS — M1990 Unspecified osteoarthritis, unspecified site: Secondary | ICD-10-CM | POA: Diagnosis not present

## 2022-04-24 DIAGNOSIS — I350 Nonrheumatic aortic (valve) stenosis: Secondary | ICD-10-CM | POA: Diagnosis not present

## 2022-04-24 DIAGNOSIS — I639 Cerebral infarction, unspecified: Secondary | ICD-10-CM | POA: Diagnosis not present

## 2022-04-24 DIAGNOSIS — E785 Hyperlipidemia, unspecified: Secondary | ICD-10-CM | POA: Diagnosis not present

## 2022-04-24 DIAGNOSIS — I10 Essential (primary) hypertension: Secondary | ICD-10-CM | POA: Diagnosis not present

## 2022-04-24 DIAGNOSIS — I2510 Atherosclerotic heart disease of native coronary artery without angina pectoris: Secondary | ICD-10-CM | POA: Diagnosis not present

## 2022-04-24 DIAGNOSIS — G8194 Hemiplegia, unspecified affecting left nondominant side: Secondary | ICD-10-CM | POA: Diagnosis not present

## 2022-04-28 DIAGNOSIS — I2510 Atherosclerotic heart disease of native coronary artery without angina pectoris: Secondary | ICD-10-CM | POA: Diagnosis not present

## 2022-04-28 DIAGNOSIS — R2681 Unsteadiness on feet: Secondary | ICD-10-CM | POA: Diagnosis not present

## 2022-04-28 DIAGNOSIS — I10 Essential (primary) hypertension: Secondary | ICD-10-CM | POA: Diagnosis not present

## 2022-04-28 DIAGNOSIS — M1990 Unspecified osteoarthritis, unspecified site: Secondary | ICD-10-CM | POA: Diagnosis not present

## 2022-04-28 DIAGNOSIS — E785 Hyperlipidemia, unspecified: Secondary | ICD-10-CM | POA: Diagnosis not present

## 2022-04-28 DIAGNOSIS — I350 Nonrheumatic aortic (valve) stenosis: Secondary | ICD-10-CM | POA: Diagnosis not present

## 2022-04-28 DIAGNOSIS — I639 Cerebral infarction, unspecified: Secondary | ICD-10-CM | POA: Diagnosis not present

## 2022-04-28 DIAGNOSIS — G8194 Hemiplegia, unspecified affecting left nondominant side: Secondary | ICD-10-CM | POA: Diagnosis not present

## 2022-05-01 DIAGNOSIS — I2510 Atherosclerotic heart disease of native coronary artery without angina pectoris: Secondary | ICD-10-CM | POA: Diagnosis not present

## 2022-05-01 DIAGNOSIS — I350 Nonrheumatic aortic (valve) stenosis: Secondary | ICD-10-CM | POA: Diagnosis not present

## 2022-05-01 DIAGNOSIS — I639 Cerebral infarction, unspecified: Secondary | ICD-10-CM | POA: Diagnosis not present

## 2022-05-01 DIAGNOSIS — R2681 Unsteadiness on feet: Secondary | ICD-10-CM | POA: Diagnosis not present

## 2022-05-01 DIAGNOSIS — E785 Hyperlipidemia, unspecified: Secondary | ICD-10-CM | POA: Diagnosis not present

## 2022-05-01 DIAGNOSIS — M1990 Unspecified osteoarthritis, unspecified site: Secondary | ICD-10-CM | POA: Diagnosis not present

## 2022-05-01 DIAGNOSIS — G8194 Hemiplegia, unspecified affecting left nondominant side: Secondary | ICD-10-CM | POA: Diagnosis not present

## 2022-05-01 DIAGNOSIS — I10 Essential (primary) hypertension: Secondary | ICD-10-CM | POA: Diagnosis not present

## 2022-05-04 DIAGNOSIS — E782 Mixed hyperlipidemia: Secondary | ICD-10-CM | POA: Diagnosis not present

## 2022-05-04 DIAGNOSIS — J189 Pneumonia, unspecified organism: Secondary | ICD-10-CM | POA: Diagnosis not present

## 2022-05-04 DIAGNOSIS — I10 Essential (primary) hypertension: Secondary | ICD-10-CM | POA: Diagnosis not present

## 2022-05-04 DIAGNOSIS — K5901 Slow transit constipation: Secondary | ICD-10-CM | POA: Diagnosis not present

## 2022-05-04 DIAGNOSIS — I6990 Unspecified sequelae of unspecified cerebrovascular disease: Secondary | ICD-10-CM | POA: Diagnosis not present

## 2022-05-04 DIAGNOSIS — M6281 Muscle weakness (generalized): Secondary | ICD-10-CM | POA: Diagnosis not present

## 2022-05-04 DIAGNOSIS — I471 Supraventricular tachycardia, unspecified: Secondary | ICD-10-CM | POA: Diagnosis not present

## 2022-05-04 DIAGNOSIS — K2100 Gastro-esophageal reflux disease with esophagitis, without bleeding: Secondary | ICD-10-CM | POA: Diagnosis not present

## 2022-05-04 DIAGNOSIS — E876 Hypokalemia: Secondary | ICD-10-CM | POA: Diagnosis not present

## 2022-05-05 DIAGNOSIS — R41841 Cognitive communication deficit: Secondary | ICD-10-CM | POA: Diagnosis not present

## 2022-05-05 DIAGNOSIS — N179 Acute kidney failure, unspecified: Secondary | ICD-10-CM | POA: Diagnosis not present

## 2022-05-05 DIAGNOSIS — I69354 Hemiplegia and hemiparesis following cerebral infarction affecting left non-dominant side: Secondary | ICD-10-CM | POA: Diagnosis not present

## 2022-05-05 DIAGNOSIS — I2510 Atherosclerotic heart disease of native coronary artery without angina pectoris: Secondary | ICD-10-CM | POA: Diagnosis not present

## 2022-05-05 DIAGNOSIS — K219 Gastro-esophageal reflux disease without esophagitis: Secondary | ICD-10-CM | POA: Diagnosis not present

## 2022-05-05 DIAGNOSIS — E871 Hypo-osmolality and hyponatremia: Secondary | ICD-10-CM | POA: Diagnosis not present

## 2022-05-05 DIAGNOSIS — E876 Hypokalemia: Secondary | ICD-10-CM | POA: Diagnosis not present

## 2022-05-05 DIAGNOSIS — M1990 Unspecified osteoarthritis, unspecified site: Secondary | ICD-10-CM | POA: Diagnosis not present

## 2022-05-05 DIAGNOSIS — I10 Essential (primary) hypertension: Secondary | ICD-10-CM | POA: Diagnosis not present

## 2022-05-06 DIAGNOSIS — I69354 Hemiplegia and hemiparesis following cerebral infarction affecting left non-dominant side: Secondary | ICD-10-CM | POA: Diagnosis not present

## 2022-05-06 DIAGNOSIS — I2510 Atherosclerotic heart disease of native coronary artery without angina pectoris: Secondary | ICD-10-CM | POA: Diagnosis not present

## 2022-05-06 DIAGNOSIS — E871 Hypo-osmolality and hyponatremia: Secondary | ICD-10-CM | POA: Diagnosis not present

## 2022-05-06 DIAGNOSIS — E876 Hypokalemia: Secondary | ICD-10-CM | POA: Diagnosis not present

## 2022-05-06 DIAGNOSIS — K219 Gastro-esophageal reflux disease without esophagitis: Secondary | ICD-10-CM | POA: Diagnosis not present

## 2022-05-06 DIAGNOSIS — I10 Essential (primary) hypertension: Secondary | ICD-10-CM | POA: Diagnosis not present

## 2022-05-06 DIAGNOSIS — N179 Acute kidney failure, unspecified: Secondary | ICD-10-CM | POA: Diagnosis not present

## 2022-05-06 DIAGNOSIS — M1990 Unspecified osteoarthritis, unspecified site: Secondary | ICD-10-CM | POA: Diagnosis not present

## 2022-05-06 DIAGNOSIS — R41841 Cognitive communication deficit: Secondary | ICD-10-CM | POA: Diagnosis not present

## 2022-05-07 DIAGNOSIS — I10 Essential (primary) hypertension: Secondary | ICD-10-CM | POA: Diagnosis not present

## 2022-05-07 DIAGNOSIS — K219 Gastro-esophageal reflux disease without esophagitis: Secondary | ICD-10-CM | POA: Diagnosis not present

## 2022-05-07 DIAGNOSIS — E871 Hypo-osmolality and hyponatremia: Secondary | ICD-10-CM | POA: Diagnosis not present

## 2022-05-07 DIAGNOSIS — R41841 Cognitive communication deficit: Secondary | ICD-10-CM | POA: Diagnosis not present

## 2022-05-07 DIAGNOSIS — N179 Acute kidney failure, unspecified: Secondary | ICD-10-CM | POA: Diagnosis not present

## 2022-05-07 DIAGNOSIS — I69354 Hemiplegia and hemiparesis following cerebral infarction affecting left non-dominant side: Secondary | ICD-10-CM | POA: Diagnosis not present

## 2022-05-07 DIAGNOSIS — M1990 Unspecified osteoarthritis, unspecified site: Secondary | ICD-10-CM | POA: Diagnosis not present

## 2022-05-07 DIAGNOSIS — I2510 Atherosclerotic heart disease of native coronary artery without angina pectoris: Secondary | ICD-10-CM | POA: Diagnosis not present

## 2022-05-07 DIAGNOSIS — E876 Hypokalemia: Secondary | ICD-10-CM | POA: Diagnosis not present

## 2022-05-08 DIAGNOSIS — I69354 Hemiplegia and hemiparesis following cerebral infarction affecting left non-dominant side: Secondary | ICD-10-CM | POA: Diagnosis not present

## 2022-05-08 DIAGNOSIS — R41841 Cognitive communication deficit: Secondary | ICD-10-CM | POA: Diagnosis not present

## 2022-05-08 DIAGNOSIS — I2510 Atherosclerotic heart disease of native coronary artery without angina pectoris: Secondary | ICD-10-CM | POA: Diagnosis not present

## 2022-05-08 DIAGNOSIS — I10 Essential (primary) hypertension: Secondary | ICD-10-CM | POA: Diagnosis not present

## 2022-05-08 DIAGNOSIS — E876 Hypokalemia: Secondary | ICD-10-CM | POA: Diagnosis not present

## 2022-05-08 DIAGNOSIS — N179 Acute kidney failure, unspecified: Secondary | ICD-10-CM | POA: Diagnosis not present

## 2022-05-08 DIAGNOSIS — K219 Gastro-esophageal reflux disease without esophagitis: Secondary | ICD-10-CM | POA: Diagnosis not present

## 2022-05-08 DIAGNOSIS — E871 Hypo-osmolality and hyponatremia: Secondary | ICD-10-CM | POA: Diagnosis not present

## 2022-05-08 DIAGNOSIS — M1990 Unspecified osteoarthritis, unspecified site: Secondary | ICD-10-CM | POA: Diagnosis not present

## 2022-05-11 DIAGNOSIS — E876 Hypokalemia: Secondary | ICD-10-CM | POA: Diagnosis not present

## 2022-05-11 DIAGNOSIS — I10 Essential (primary) hypertension: Secondary | ICD-10-CM | POA: Diagnosis not present

## 2022-05-11 DIAGNOSIS — R41841 Cognitive communication deficit: Secondary | ICD-10-CM | POA: Diagnosis not present

## 2022-05-11 DIAGNOSIS — I69354 Hemiplegia and hemiparesis following cerebral infarction affecting left non-dominant side: Secondary | ICD-10-CM | POA: Diagnosis not present

## 2022-05-11 DIAGNOSIS — I2510 Atherosclerotic heart disease of native coronary artery without angina pectoris: Secondary | ICD-10-CM | POA: Diagnosis not present

## 2022-05-11 DIAGNOSIS — E871 Hypo-osmolality and hyponatremia: Secondary | ICD-10-CM | POA: Diagnosis not present

## 2022-05-11 DIAGNOSIS — M1990 Unspecified osteoarthritis, unspecified site: Secondary | ICD-10-CM | POA: Diagnosis not present

## 2022-05-11 DIAGNOSIS — N179 Acute kidney failure, unspecified: Secondary | ICD-10-CM | POA: Diagnosis not present

## 2022-05-11 DIAGNOSIS — K219 Gastro-esophageal reflux disease without esophagitis: Secondary | ICD-10-CM | POA: Diagnosis not present

## 2022-05-12 DIAGNOSIS — I2510 Atherosclerotic heart disease of native coronary artery without angina pectoris: Secondary | ICD-10-CM | POA: Diagnosis not present

## 2022-05-12 DIAGNOSIS — I69354 Hemiplegia and hemiparesis following cerebral infarction affecting left non-dominant side: Secondary | ICD-10-CM | POA: Diagnosis not present

## 2022-05-12 DIAGNOSIS — E871 Hypo-osmolality and hyponatremia: Secondary | ICD-10-CM | POA: Diagnosis not present

## 2022-05-12 DIAGNOSIS — K219 Gastro-esophageal reflux disease without esophagitis: Secondary | ICD-10-CM | POA: Diagnosis not present

## 2022-05-12 DIAGNOSIS — R41841 Cognitive communication deficit: Secondary | ICD-10-CM | POA: Diagnosis not present

## 2022-05-12 DIAGNOSIS — I10 Essential (primary) hypertension: Secondary | ICD-10-CM | POA: Diagnosis not present

## 2022-05-12 DIAGNOSIS — E876 Hypokalemia: Secondary | ICD-10-CM | POA: Diagnosis not present

## 2022-05-12 DIAGNOSIS — M1990 Unspecified osteoarthritis, unspecified site: Secondary | ICD-10-CM | POA: Diagnosis not present

## 2022-05-12 DIAGNOSIS — N179 Acute kidney failure, unspecified: Secondary | ICD-10-CM | POA: Diagnosis not present

## 2022-05-12 NOTE — Progress Notes (Signed)
Carelink Summary Report / Loop Recorder 

## 2022-05-13 DIAGNOSIS — E871 Hypo-osmolality and hyponatremia: Secondary | ICD-10-CM | POA: Diagnosis not present

## 2022-05-13 DIAGNOSIS — E876 Hypokalemia: Secondary | ICD-10-CM | POA: Diagnosis not present

## 2022-05-13 DIAGNOSIS — I69354 Hemiplegia and hemiparesis following cerebral infarction affecting left non-dominant side: Secondary | ICD-10-CM | POA: Diagnosis not present

## 2022-05-13 DIAGNOSIS — I2510 Atherosclerotic heart disease of native coronary artery without angina pectoris: Secondary | ICD-10-CM | POA: Diagnosis not present

## 2022-05-13 DIAGNOSIS — I10 Essential (primary) hypertension: Secondary | ICD-10-CM | POA: Diagnosis not present

## 2022-05-13 DIAGNOSIS — K219 Gastro-esophageal reflux disease without esophagitis: Secondary | ICD-10-CM | POA: Diagnosis not present

## 2022-05-13 DIAGNOSIS — N179 Acute kidney failure, unspecified: Secondary | ICD-10-CM | POA: Diagnosis not present

## 2022-05-13 DIAGNOSIS — R41841 Cognitive communication deficit: Secondary | ICD-10-CM | POA: Diagnosis not present

## 2022-05-13 DIAGNOSIS — M1990 Unspecified osteoarthritis, unspecified site: Secondary | ICD-10-CM | POA: Diagnosis not present

## 2022-05-15 DIAGNOSIS — M1990 Unspecified osteoarthritis, unspecified site: Secondary | ICD-10-CM | POA: Diagnosis not present

## 2022-05-15 DIAGNOSIS — I69354 Hemiplegia and hemiparesis following cerebral infarction affecting left non-dominant side: Secondary | ICD-10-CM | POA: Diagnosis not present

## 2022-05-15 DIAGNOSIS — R41841 Cognitive communication deficit: Secondary | ICD-10-CM | POA: Diagnosis not present

## 2022-05-15 DIAGNOSIS — K219 Gastro-esophageal reflux disease without esophagitis: Secondary | ICD-10-CM | POA: Diagnosis not present

## 2022-05-15 DIAGNOSIS — I10 Essential (primary) hypertension: Secondary | ICD-10-CM | POA: Diagnosis not present

## 2022-05-15 DIAGNOSIS — E876 Hypokalemia: Secondary | ICD-10-CM | POA: Diagnosis not present

## 2022-05-15 DIAGNOSIS — I2510 Atherosclerotic heart disease of native coronary artery without angina pectoris: Secondary | ICD-10-CM | POA: Diagnosis not present

## 2022-05-15 DIAGNOSIS — E871 Hypo-osmolality and hyponatremia: Secondary | ICD-10-CM | POA: Diagnosis not present

## 2022-05-15 DIAGNOSIS — N179 Acute kidney failure, unspecified: Secondary | ICD-10-CM | POA: Diagnosis not present

## 2022-05-18 ENCOUNTER — Ambulatory Visit: Payer: Medicare HMO

## 2022-05-18 DIAGNOSIS — E871 Hypo-osmolality and hyponatremia: Secondary | ICD-10-CM | POA: Diagnosis not present

## 2022-05-18 DIAGNOSIS — I639 Cerebral infarction, unspecified: Secondary | ICD-10-CM

## 2022-05-18 DIAGNOSIS — I2510 Atherosclerotic heart disease of native coronary artery without angina pectoris: Secondary | ICD-10-CM | POA: Diagnosis not present

## 2022-05-18 DIAGNOSIS — N179 Acute kidney failure, unspecified: Secondary | ICD-10-CM | POA: Diagnosis not present

## 2022-05-18 DIAGNOSIS — M1990 Unspecified osteoarthritis, unspecified site: Secondary | ICD-10-CM | POA: Diagnosis not present

## 2022-05-18 DIAGNOSIS — K219 Gastro-esophageal reflux disease without esophagitis: Secondary | ICD-10-CM | POA: Diagnosis not present

## 2022-05-18 DIAGNOSIS — I10 Essential (primary) hypertension: Secondary | ICD-10-CM | POA: Diagnosis not present

## 2022-05-18 DIAGNOSIS — I69354 Hemiplegia and hemiparesis following cerebral infarction affecting left non-dominant side: Secondary | ICD-10-CM | POA: Diagnosis not present

## 2022-05-18 DIAGNOSIS — E876 Hypokalemia: Secondary | ICD-10-CM | POA: Diagnosis not present

## 2022-05-18 DIAGNOSIS — R41841 Cognitive communication deficit: Secondary | ICD-10-CM | POA: Diagnosis not present

## 2022-05-19 DIAGNOSIS — M1990 Unspecified osteoarthritis, unspecified site: Secondary | ICD-10-CM | POA: Diagnosis not present

## 2022-05-19 DIAGNOSIS — E876 Hypokalemia: Secondary | ICD-10-CM | POA: Diagnosis not present

## 2022-05-19 DIAGNOSIS — I69354 Hemiplegia and hemiparesis following cerebral infarction affecting left non-dominant side: Secondary | ICD-10-CM | POA: Diagnosis not present

## 2022-05-19 DIAGNOSIS — R41841 Cognitive communication deficit: Secondary | ICD-10-CM | POA: Diagnosis not present

## 2022-05-19 DIAGNOSIS — E871 Hypo-osmolality and hyponatremia: Secondary | ICD-10-CM | POA: Diagnosis not present

## 2022-05-19 DIAGNOSIS — I10 Essential (primary) hypertension: Secondary | ICD-10-CM | POA: Diagnosis not present

## 2022-05-19 DIAGNOSIS — K219 Gastro-esophageal reflux disease without esophagitis: Secondary | ICD-10-CM | POA: Diagnosis not present

## 2022-05-19 DIAGNOSIS — N179 Acute kidney failure, unspecified: Secondary | ICD-10-CM | POA: Diagnosis not present

## 2022-05-19 DIAGNOSIS — I2510 Atherosclerotic heart disease of native coronary artery without angina pectoris: Secondary | ICD-10-CM | POA: Diagnosis not present

## 2022-05-19 LAB — CUP PACEART REMOTE DEVICE CHECK
Date Time Interrogation Session: 20240225165920
Implantable Pulse Generator Implant Date: 20231215

## 2022-05-20 DIAGNOSIS — E871 Hypo-osmolality and hyponatremia: Secondary | ICD-10-CM | POA: Diagnosis not present

## 2022-05-20 DIAGNOSIS — I69354 Hemiplegia and hemiparesis following cerebral infarction affecting left non-dominant side: Secondary | ICD-10-CM | POA: Diagnosis not present

## 2022-05-20 DIAGNOSIS — N179 Acute kidney failure, unspecified: Secondary | ICD-10-CM | POA: Diagnosis not present

## 2022-05-20 DIAGNOSIS — I10 Essential (primary) hypertension: Secondary | ICD-10-CM | POA: Diagnosis not present

## 2022-05-20 DIAGNOSIS — K219 Gastro-esophageal reflux disease without esophagitis: Secondary | ICD-10-CM | POA: Diagnosis not present

## 2022-05-20 DIAGNOSIS — E876 Hypokalemia: Secondary | ICD-10-CM | POA: Diagnosis not present

## 2022-05-20 DIAGNOSIS — I2510 Atherosclerotic heart disease of native coronary artery without angina pectoris: Secondary | ICD-10-CM | POA: Diagnosis not present

## 2022-05-20 DIAGNOSIS — R41841 Cognitive communication deficit: Secondary | ICD-10-CM | POA: Diagnosis not present

## 2022-05-20 DIAGNOSIS — M1990 Unspecified osteoarthritis, unspecified site: Secondary | ICD-10-CM | POA: Diagnosis not present

## 2022-05-21 DIAGNOSIS — I519 Heart disease, unspecified: Secondary | ICD-10-CM | POA: Diagnosis not present

## 2022-05-21 DIAGNOSIS — I10 Essential (primary) hypertension: Secondary | ICD-10-CM | POA: Diagnosis not present

## 2022-05-22 DIAGNOSIS — R41841 Cognitive communication deficit: Secondary | ICD-10-CM | POA: Diagnosis not present

## 2022-05-22 DIAGNOSIS — E871 Hypo-osmolality and hyponatremia: Secondary | ICD-10-CM | POA: Diagnosis not present

## 2022-05-22 DIAGNOSIS — N179 Acute kidney failure, unspecified: Secondary | ICD-10-CM | POA: Diagnosis not present

## 2022-05-22 DIAGNOSIS — K219 Gastro-esophageal reflux disease without esophagitis: Secondary | ICD-10-CM | POA: Diagnosis not present

## 2022-05-22 DIAGNOSIS — I2510 Atherosclerotic heart disease of native coronary artery without angina pectoris: Secondary | ICD-10-CM | POA: Diagnosis not present

## 2022-05-22 DIAGNOSIS — I10 Essential (primary) hypertension: Secondary | ICD-10-CM | POA: Diagnosis not present

## 2022-05-22 DIAGNOSIS — E876 Hypokalemia: Secondary | ICD-10-CM | POA: Diagnosis not present

## 2022-05-22 DIAGNOSIS — I69354 Hemiplegia and hemiparesis following cerebral infarction affecting left non-dominant side: Secondary | ICD-10-CM | POA: Diagnosis not present

## 2022-05-22 DIAGNOSIS — M1990 Unspecified osteoarthritis, unspecified site: Secondary | ICD-10-CM | POA: Diagnosis not present

## 2022-05-25 DIAGNOSIS — N179 Acute kidney failure, unspecified: Secondary | ICD-10-CM | POA: Diagnosis not present

## 2022-05-25 DIAGNOSIS — K219 Gastro-esophageal reflux disease without esophagitis: Secondary | ICD-10-CM | POA: Diagnosis not present

## 2022-05-25 DIAGNOSIS — I10 Essential (primary) hypertension: Secondary | ICD-10-CM | POA: Diagnosis not present

## 2022-05-25 DIAGNOSIS — E871 Hypo-osmolality and hyponatremia: Secondary | ICD-10-CM | POA: Diagnosis not present

## 2022-05-25 DIAGNOSIS — I69354 Hemiplegia and hemiparesis following cerebral infarction affecting left non-dominant side: Secondary | ICD-10-CM | POA: Diagnosis not present

## 2022-05-25 DIAGNOSIS — E876 Hypokalemia: Secondary | ICD-10-CM | POA: Diagnosis not present

## 2022-05-25 DIAGNOSIS — M1990 Unspecified osteoarthritis, unspecified site: Secondary | ICD-10-CM | POA: Diagnosis not present

## 2022-05-25 DIAGNOSIS — I2510 Atherosclerotic heart disease of native coronary artery without angina pectoris: Secondary | ICD-10-CM | POA: Diagnosis not present

## 2022-05-25 DIAGNOSIS — R41841 Cognitive communication deficit: Secondary | ICD-10-CM | POA: Diagnosis not present

## 2022-05-26 DIAGNOSIS — K219 Gastro-esophageal reflux disease without esophagitis: Secondary | ICD-10-CM | POA: Diagnosis not present

## 2022-05-26 DIAGNOSIS — R41841 Cognitive communication deficit: Secondary | ICD-10-CM | POA: Diagnosis not present

## 2022-05-26 DIAGNOSIS — E876 Hypokalemia: Secondary | ICD-10-CM | POA: Diagnosis not present

## 2022-05-26 DIAGNOSIS — I2510 Atherosclerotic heart disease of native coronary artery without angina pectoris: Secondary | ICD-10-CM | POA: Diagnosis not present

## 2022-05-26 DIAGNOSIS — I10 Essential (primary) hypertension: Secondary | ICD-10-CM | POA: Diagnosis not present

## 2022-05-26 DIAGNOSIS — M1990 Unspecified osteoarthritis, unspecified site: Secondary | ICD-10-CM | POA: Diagnosis not present

## 2022-05-26 DIAGNOSIS — E871 Hypo-osmolality and hyponatremia: Secondary | ICD-10-CM | POA: Diagnosis not present

## 2022-05-26 DIAGNOSIS — I69354 Hemiplegia and hemiparesis following cerebral infarction affecting left non-dominant side: Secondary | ICD-10-CM | POA: Diagnosis not present

## 2022-05-26 DIAGNOSIS — N179 Acute kidney failure, unspecified: Secondary | ICD-10-CM | POA: Diagnosis not present

## 2022-05-29 DIAGNOSIS — M1990 Unspecified osteoarthritis, unspecified site: Secondary | ICD-10-CM | POA: Diagnosis not present

## 2022-05-29 DIAGNOSIS — N179 Acute kidney failure, unspecified: Secondary | ICD-10-CM | POA: Diagnosis not present

## 2022-05-29 DIAGNOSIS — I69354 Hemiplegia and hemiparesis following cerebral infarction affecting left non-dominant side: Secondary | ICD-10-CM | POA: Diagnosis not present

## 2022-05-29 DIAGNOSIS — K219 Gastro-esophageal reflux disease without esophagitis: Secondary | ICD-10-CM | POA: Diagnosis not present

## 2022-05-29 DIAGNOSIS — I10 Essential (primary) hypertension: Secondary | ICD-10-CM | POA: Diagnosis not present

## 2022-05-29 DIAGNOSIS — E871 Hypo-osmolality and hyponatremia: Secondary | ICD-10-CM | POA: Diagnosis not present

## 2022-05-29 DIAGNOSIS — R41841 Cognitive communication deficit: Secondary | ICD-10-CM | POA: Diagnosis not present

## 2022-05-29 DIAGNOSIS — E876 Hypokalemia: Secondary | ICD-10-CM | POA: Diagnosis not present

## 2022-05-29 DIAGNOSIS — I2510 Atherosclerotic heart disease of native coronary artery without angina pectoris: Secondary | ICD-10-CM | POA: Diagnosis not present

## 2022-06-01 DIAGNOSIS — I2510 Atherosclerotic heart disease of native coronary artery without angina pectoris: Secondary | ICD-10-CM | POA: Diagnosis not present

## 2022-06-01 DIAGNOSIS — N179 Acute kidney failure, unspecified: Secondary | ICD-10-CM | POA: Diagnosis not present

## 2022-06-01 DIAGNOSIS — E871 Hypo-osmolality and hyponatremia: Secondary | ICD-10-CM | POA: Diagnosis not present

## 2022-06-01 DIAGNOSIS — E876 Hypokalemia: Secondary | ICD-10-CM | POA: Diagnosis not present

## 2022-06-01 DIAGNOSIS — I10 Essential (primary) hypertension: Secondary | ICD-10-CM | POA: Diagnosis not present

## 2022-06-01 DIAGNOSIS — I69354 Hemiplegia and hemiparesis following cerebral infarction affecting left non-dominant side: Secondary | ICD-10-CM | POA: Diagnosis not present

## 2022-06-01 DIAGNOSIS — K219 Gastro-esophageal reflux disease without esophagitis: Secondary | ICD-10-CM | POA: Diagnosis not present

## 2022-06-01 DIAGNOSIS — M1990 Unspecified osteoarthritis, unspecified site: Secondary | ICD-10-CM | POA: Diagnosis not present

## 2022-06-01 DIAGNOSIS — R41841 Cognitive communication deficit: Secondary | ICD-10-CM | POA: Diagnosis not present

## 2022-06-04 DIAGNOSIS — I10 Essential (primary) hypertension: Secondary | ICD-10-CM | POA: Diagnosis not present

## 2022-06-04 DIAGNOSIS — I519 Heart disease, unspecified: Secondary | ICD-10-CM | POA: Diagnosis not present

## 2022-06-09 DIAGNOSIS — M1990 Unspecified osteoarthritis, unspecified site: Secondary | ICD-10-CM | POA: Diagnosis not present

## 2022-06-09 DIAGNOSIS — E876 Hypokalemia: Secondary | ICD-10-CM | POA: Diagnosis not present

## 2022-06-09 DIAGNOSIS — I2510 Atherosclerotic heart disease of native coronary artery without angina pectoris: Secondary | ICD-10-CM | POA: Diagnosis not present

## 2022-06-09 DIAGNOSIS — R41841 Cognitive communication deficit: Secondary | ICD-10-CM | POA: Diagnosis not present

## 2022-06-09 DIAGNOSIS — I69354 Hemiplegia and hemiparesis following cerebral infarction affecting left non-dominant side: Secondary | ICD-10-CM | POA: Diagnosis not present

## 2022-06-09 DIAGNOSIS — E871 Hypo-osmolality and hyponatremia: Secondary | ICD-10-CM | POA: Diagnosis not present

## 2022-06-09 DIAGNOSIS — N179 Acute kidney failure, unspecified: Secondary | ICD-10-CM | POA: Diagnosis not present

## 2022-06-09 DIAGNOSIS — K219 Gastro-esophageal reflux disease without esophagitis: Secondary | ICD-10-CM | POA: Diagnosis not present

## 2022-06-09 DIAGNOSIS — I10 Essential (primary) hypertension: Secondary | ICD-10-CM | POA: Diagnosis not present

## 2022-06-16 DIAGNOSIS — I10 Essential (primary) hypertension: Secondary | ICD-10-CM | POA: Diagnosis not present

## 2022-06-16 DIAGNOSIS — K219 Gastro-esophageal reflux disease without esophagitis: Secondary | ICD-10-CM | POA: Diagnosis not present

## 2022-06-16 DIAGNOSIS — E871 Hypo-osmolality and hyponatremia: Secondary | ICD-10-CM | POA: Diagnosis not present

## 2022-06-16 DIAGNOSIS — N179 Acute kidney failure, unspecified: Secondary | ICD-10-CM | POA: Diagnosis not present

## 2022-06-16 DIAGNOSIS — I2510 Atherosclerotic heart disease of native coronary artery without angina pectoris: Secondary | ICD-10-CM | POA: Diagnosis not present

## 2022-06-16 DIAGNOSIS — M1990 Unspecified osteoarthritis, unspecified site: Secondary | ICD-10-CM | POA: Diagnosis not present

## 2022-06-16 DIAGNOSIS — R41841 Cognitive communication deficit: Secondary | ICD-10-CM | POA: Diagnosis not present

## 2022-06-16 DIAGNOSIS — E876 Hypokalemia: Secondary | ICD-10-CM | POA: Diagnosis not present

## 2022-06-16 DIAGNOSIS — I69354 Hemiplegia and hemiparesis following cerebral infarction affecting left non-dominant side: Secondary | ICD-10-CM | POA: Diagnosis not present

## 2022-06-17 DIAGNOSIS — E871 Hypo-osmolality and hyponatremia: Secondary | ICD-10-CM | POA: Diagnosis not present

## 2022-06-17 DIAGNOSIS — E876 Hypokalemia: Secondary | ICD-10-CM | POA: Diagnosis not present

## 2022-06-17 DIAGNOSIS — I69354 Hemiplegia and hemiparesis following cerebral infarction affecting left non-dominant side: Secondary | ICD-10-CM | POA: Diagnosis not present

## 2022-06-17 DIAGNOSIS — K219 Gastro-esophageal reflux disease without esophagitis: Secondary | ICD-10-CM | POA: Diagnosis not present

## 2022-06-17 DIAGNOSIS — I10 Essential (primary) hypertension: Secondary | ICD-10-CM | POA: Diagnosis not present

## 2022-06-17 DIAGNOSIS — N179 Acute kidney failure, unspecified: Secondary | ICD-10-CM | POA: Diagnosis not present

## 2022-06-17 DIAGNOSIS — I2510 Atherosclerotic heart disease of native coronary artery without angina pectoris: Secondary | ICD-10-CM | POA: Diagnosis not present

## 2022-06-17 DIAGNOSIS — M1990 Unspecified osteoarthritis, unspecified site: Secondary | ICD-10-CM | POA: Diagnosis not present

## 2022-06-17 DIAGNOSIS — R41841 Cognitive communication deficit: Secondary | ICD-10-CM | POA: Diagnosis not present

## 2022-06-18 DIAGNOSIS — F419 Anxiety disorder, unspecified: Secondary | ICD-10-CM | POA: Diagnosis not present

## 2022-06-18 DIAGNOSIS — B9689 Other specified bacterial agents as the cause of diseases classified elsewhere: Secondary | ICD-10-CM | POA: Diagnosis not present

## 2022-06-18 DIAGNOSIS — R058 Other specified cough: Secondary | ICD-10-CM | POA: Diagnosis not present

## 2022-06-18 DIAGNOSIS — J069 Acute upper respiratory infection, unspecified: Secondary | ICD-10-CM | POA: Diagnosis not present

## 2022-06-22 ENCOUNTER — Ambulatory Visit (INDEPENDENT_AMBULATORY_CARE_PROVIDER_SITE_OTHER): Payer: Medicare HMO

## 2022-06-22 DIAGNOSIS — K219 Gastro-esophageal reflux disease without esophagitis: Secondary | ICD-10-CM | POA: Diagnosis not present

## 2022-06-22 DIAGNOSIS — I639 Cerebral infarction, unspecified: Secondary | ICD-10-CM | POA: Diagnosis not present

## 2022-06-22 DIAGNOSIS — M1990 Unspecified osteoarthritis, unspecified site: Secondary | ICD-10-CM | POA: Diagnosis not present

## 2022-06-22 DIAGNOSIS — R41841 Cognitive communication deficit: Secondary | ICD-10-CM | POA: Diagnosis not present

## 2022-06-22 DIAGNOSIS — I69354 Hemiplegia and hemiparesis following cerebral infarction affecting left non-dominant side: Secondary | ICD-10-CM | POA: Diagnosis not present

## 2022-06-22 DIAGNOSIS — E876 Hypokalemia: Secondary | ICD-10-CM | POA: Diagnosis not present

## 2022-06-22 DIAGNOSIS — N179 Acute kidney failure, unspecified: Secondary | ICD-10-CM | POA: Diagnosis not present

## 2022-06-22 DIAGNOSIS — I2510 Atherosclerotic heart disease of native coronary artery without angina pectoris: Secondary | ICD-10-CM | POA: Diagnosis not present

## 2022-06-22 DIAGNOSIS — I10 Essential (primary) hypertension: Secondary | ICD-10-CM | POA: Diagnosis not present

## 2022-06-22 DIAGNOSIS — E871 Hypo-osmolality and hyponatremia: Secondary | ICD-10-CM | POA: Diagnosis not present

## 2022-06-23 LAB — CUP PACEART REMOTE DEVICE CHECK
Date Time Interrogation Session: 20240331231757
Implantable Pulse Generator Implant Date: 20231215

## 2022-06-29 DIAGNOSIS — K219 Gastro-esophageal reflux disease without esophagitis: Secondary | ICD-10-CM | POA: Diagnosis not present

## 2022-06-29 DIAGNOSIS — I69354 Hemiplegia and hemiparesis following cerebral infarction affecting left non-dominant side: Secondary | ICD-10-CM | POA: Diagnosis not present

## 2022-06-29 DIAGNOSIS — E871 Hypo-osmolality and hyponatremia: Secondary | ICD-10-CM | POA: Diagnosis not present

## 2022-06-29 DIAGNOSIS — E876 Hypokalemia: Secondary | ICD-10-CM | POA: Diagnosis not present

## 2022-06-29 DIAGNOSIS — I2510 Atherosclerotic heart disease of native coronary artery without angina pectoris: Secondary | ICD-10-CM | POA: Diagnosis not present

## 2022-06-29 DIAGNOSIS — N179 Acute kidney failure, unspecified: Secondary | ICD-10-CM | POA: Diagnosis not present

## 2022-06-29 DIAGNOSIS — M1990 Unspecified osteoarthritis, unspecified site: Secondary | ICD-10-CM | POA: Diagnosis not present

## 2022-06-29 DIAGNOSIS — R41841 Cognitive communication deficit: Secondary | ICD-10-CM | POA: Diagnosis not present

## 2022-06-29 DIAGNOSIS — I10 Essential (primary) hypertension: Secondary | ICD-10-CM | POA: Diagnosis not present

## 2022-06-29 NOTE — Progress Notes (Signed)
Carelink Summary Report / Loop Recorder 

## 2022-06-30 DIAGNOSIS — R41841 Cognitive communication deficit: Secondary | ICD-10-CM | POA: Diagnosis not present

## 2022-06-30 DIAGNOSIS — I69354 Hemiplegia and hemiparesis following cerebral infarction affecting left non-dominant side: Secondary | ICD-10-CM | POA: Diagnosis not present

## 2022-06-30 DIAGNOSIS — E871 Hypo-osmolality and hyponatremia: Secondary | ICD-10-CM | POA: Diagnosis not present

## 2022-06-30 DIAGNOSIS — E876 Hypokalemia: Secondary | ICD-10-CM | POA: Diagnosis not present

## 2022-06-30 DIAGNOSIS — I2510 Atherosclerotic heart disease of native coronary artery without angina pectoris: Secondary | ICD-10-CM | POA: Diagnosis not present

## 2022-06-30 DIAGNOSIS — K219 Gastro-esophageal reflux disease without esophagitis: Secondary | ICD-10-CM | POA: Diagnosis not present

## 2022-06-30 DIAGNOSIS — I10 Essential (primary) hypertension: Secondary | ICD-10-CM | POA: Diagnosis not present

## 2022-06-30 DIAGNOSIS — N179 Acute kidney failure, unspecified: Secondary | ICD-10-CM | POA: Diagnosis not present

## 2022-06-30 DIAGNOSIS — M1990 Unspecified osteoarthritis, unspecified site: Secondary | ICD-10-CM | POA: Diagnosis not present

## 2022-07-04 DIAGNOSIS — I471 Supraventricular tachycardia, unspecified: Secondary | ICD-10-CM | POA: Diagnosis not present

## 2022-07-04 DIAGNOSIS — I350 Nonrheumatic aortic (valve) stenosis: Secondary | ICD-10-CM | POA: Diagnosis not present

## 2022-07-04 DIAGNOSIS — F32A Depression, unspecified: Secondary | ICD-10-CM | POA: Diagnosis not present

## 2022-07-04 DIAGNOSIS — M1990 Unspecified osteoarthritis, unspecified site: Secondary | ICD-10-CM | POA: Diagnosis not present

## 2022-07-04 DIAGNOSIS — I2510 Atherosclerotic heart disease of native coronary artery without angina pectoris: Secondary | ICD-10-CM | POA: Diagnosis not present

## 2022-07-04 DIAGNOSIS — I69354 Hemiplegia and hemiparesis following cerebral infarction affecting left non-dominant side: Secondary | ICD-10-CM | POA: Diagnosis not present

## 2022-07-04 DIAGNOSIS — R41841 Cognitive communication deficit: Secondary | ICD-10-CM | POA: Diagnosis not present

## 2022-07-04 DIAGNOSIS — J069 Acute upper respiratory infection, unspecified: Secondary | ICD-10-CM | POA: Diagnosis not present

## 2022-07-04 DIAGNOSIS — I10 Essential (primary) hypertension: Secondary | ICD-10-CM | POA: Diagnosis not present

## 2022-07-14 DIAGNOSIS — J069 Acute upper respiratory infection, unspecified: Secondary | ICD-10-CM | POA: Diagnosis not present

## 2022-07-14 DIAGNOSIS — I69354 Hemiplegia and hemiparesis following cerebral infarction affecting left non-dominant side: Secondary | ICD-10-CM | POA: Diagnosis not present

## 2022-07-14 DIAGNOSIS — I10 Essential (primary) hypertension: Secondary | ICD-10-CM | POA: Diagnosis not present

## 2022-07-14 DIAGNOSIS — I2510 Atherosclerotic heart disease of native coronary artery without angina pectoris: Secondary | ICD-10-CM | POA: Diagnosis not present

## 2022-07-14 DIAGNOSIS — I471 Supraventricular tachycardia, unspecified: Secondary | ICD-10-CM | POA: Diagnosis not present

## 2022-07-14 DIAGNOSIS — M1990 Unspecified osteoarthritis, unspecified site: Secondary | ICD-10-CM | POA: Diagnosis not present

## 2022-07-14 DIAGNOSIS — F32A Depression, unspecified: Secondary | ICD-10-CM | POA: Diagnosis not present

## 2022-07-14 DIAGNOSIS — R41841 Cognitive communication deficit: Secondary | ICD-10-CM | POA: Diagnosis not present

## 2022-07-14 DIAGNOSIS — I350 Nonrheumatic aortic (valve) stenosis: Secondary | ICD-10-CM | POA: Diagnosis not present

## 2022-07-27 ENCOUNTER — Ambulatory Visit (INDEPENDENT_AMBULATORY_CARE_PROVIDER_SITE_OTHER): Payer: Medicare HMO

## 2022-07-27 DIAGNOSIS — I639 Cerebral infarction, unspecified: Secondary | ICD-10-CM

## 2022-07-27 LAB — CUP PACEART REMOTE DEVICE CHECK
Date Time Interrogation Session: 20240503230653
Implantable Pulse Generator Implant Date: 20231215

## 2022-07-28 DIAGNOSIS — F32A Depression, unspecified: Secondary | ICD-10-CM | POA: Diagnosis not present

## 2022-07-28 DIAGNOSIS — I69354 Hemiplegia and hemiparesis following cerebral infarction affecting left non-dominant side: Secondary | ICD-10-CM | POA: Diagnosis not present

## 2022-07-28 DIAGNOSIS — I10 Essential (primary) hypertension: Secondary | ICD-10-CM | POA: Diagnosis not present

## 2022-07-28 DIAGNOSIS — I2510 Atherosclerotic heart disease of native coronary artery without angina pectoris: Secondary | ICD-10-CM | POA: Diagnosis not present

## 2022-07-28 DIAGNOSIS — I471 Supraventricular tachycardia, unspecified: Secondary | ICD-10-CM | POA: Diagnosis not present

## 2022-07-28 DIAGNOSIS — I350 Nonrheumatic aortic (valve) stenosis: Secondary | ICD-10-CM | POA: Diagnosis not present

## 2022-07-28 DIAGNOSIS — R41841 Cognitive communication deficit: Secondary | ICD-10-CM | POA: Diagnosis not present

## 2022-07-28 DIAGNOSIS — M1990 Unspecified osteoarthritis, unspecified site: Secondary | ICD-10-CM | POA: Diagnosis not present

## 2022-07-28 DIAGNOSIS — J069 Acute upper respiratory infection, unspecified: Secondary | ICD-10-CM | POA: Diagnosis not present

## 2022-07-30 NOTE — Progress Notes (Signed)
Carelink Summary Report / Loop Recorder 

## 2022-08-06 DIAGNOSIS — M810 Age-related osteoporosis without current pathological fracture: Secondary | ICD-10-CM | POA: Diagnosis not present

## 2022-08-06 DIAGNOSIS — E7800 Pure hypercholesterolemia, unspecified: Secondary | ICD-10-CM | POA: Diagnosis not present

## 2022-08-06 DIAGNOSIS — I639 Cerebral infarction, unspecified: Secondary | ICD-10-CM | POA: Diagnosis not present

## 2022-08-06 DIAGNOSIS — I10 Essential (primary) hypertension: Secondary | ICD-10-CM | POA: Diagnosis not present

## 2022-08-06 DIAGNOSIS — F3341 Major depressive disorder, recurrent, in partial remission: Secondary | ICD-10-CM | POA: Diagnosis not present

## 2022-08-11 DIAGNOSIS — M1990 Unspecified osteoarthritis, unspecified site: Secondary | ICD-10-CM | POA: Diagnosis not present

## 2022-08-11 DIAGNOSIS — F32A Depression, unspecified: Secondary | ICD-10-CM | POA: Diagnosis not present

## 2022-08-11 DIAGNOSIS — I2510 Atherosclerotic heart disease of native coronary artery without angina pectoris: Secondary | ICD-10-CM | POA: Diagnosis not present

## 2022-08-11 DIAGNOSIS — J069 Acute upper respiratory infection, unspecified: Secondary | ICD-10-CM | POA: Diagnosis not present

## 2022-08-11 DIAGNOSIS — I69354 Hemiplegia and hemiparesis following cerebral infarction affecting left non-dominant side: Secondary | ICD-10-CM | POA: Diagnosis not present

## 2022-08-11 DIAGNOSIS — I10 Essential (primary) hypertension: Secondary | ICD-10-CM | POA: Diagnosis not present

## 2022-08-11 DIAGNOSIS — R41841 Cognitive communication deficit: Secondary | ICD-10-CM | POA: Diagnosis not present

## 2022-08-11 DIAGNOSIS — I350 Nonrheumatic aortic (valve) stenosis: Secondary | ICD-10-CM | POA: Diagnosis not present

## 2022-08-11 DIAGNOSIS — I471 Supraventricular tachycardia, unspecified: Secondary | ICD-10-CM | POA: Diagnosis not present

## 2022-08-25 DIAGNOSIS — I471 Supraventricular tachycardia, unspecified: Secondary | ICD-10-CM | POA: Diagnosis not present

## 2022-08-25 DIAGNOSIS — F32A Depression, unspecified: Secondary | ICD-10-CM | POA: Diagnosis not present

## 2022-08-25 DIAGNOSIS — I69354 Hemiplegia and hemiparesis following cerebral infarction affecting left non-dominant side: Secondary | ICD-10-CM | POA: Diagnosis not present

## 2022-08-25 DIAGNOSIS — R41841 Cognitive communication deficit: Secondary | ICD-10-CM | POA: Diagnosis not present

## 2022-08-25 DIAGNOSIS — I10 Essential (primary) hypertension: Secondary | ICD-10-CM | POA: Diagnosis not present

## 2022-08-25 DIAGNOSIS — I350 Nonrheumatic aortic (valve) stenosis: Secondary | ICD-10-CM | POA: Diagnosis not present

## 2022-08-25 DIAGNOSIS — I2510 Atherosclerotic heart disease of native coronary artery without angina pectoris: Secondary | ICD-10-CM | POA: Diagnosis not present

## 2022-08-25 DIAGNOSIS — J069 Acute upper respiratory infection, unspecified: Secondary | ICD-10-CM | POA: Diagnosis not present

## 2022-08-25 DIAGNOSIS — M1990 Unspecified osteoarthritis, unspecified site: Secondary | ICD-10-CM | POA: Diagnosis not present

## 2022-08-25 NOTE — Progress Notes (Signed)
Carelink Summary Report / Loop Recorder 

## 2022-08-27 LAB — CUP PACEART REMOTE DEVICE CHECK
Date Time Interrogation Session: 20240605230725
Implantable Pulse Generator Implant Date: 20231215

## 2022-08-31 ENCOUNTER — Ambulatory Visit (INDEPENDENT_AMBULATORY_CARE_PROVIDER_SITE_OTHER): Payer: Medicare HMO

## 2022-08-31 DIAGNOSIS — I639 Cerebral infarction, unspecified: Secondary | ICD-10-CM | POA: Diagnosis not present

## 2022-09-16 DIAGNOSIS — I10 Essential (primary) hypertension: Secondary | ICD-10-CM | POA: Diagnosis not present

## 2022-09-16 DIAGNOSIS — I679 Cerebrovascular disease, unspecified: Secondary | ICD-10-CM | POA: Diagnosis not present

## 2022-09-16 DIAGNOSIS — Z9181 History of falling: Secondary | ICD-10-CM | POA: Diagnosis not present

## 2022-09-16 DIAGNOSIS — E7800 Pure hypercholesterolemia, unspecified: Secondary | ICD-10-CM | POA: Diagnosis not present

## 2022-09-16 DIAGNOSIS — F332 Major depressive disorder, recurrent severe without psychotic features: Secondary | ICD-10-CM | POA: Diagnosis not present

## 2022-09-16 DIAGNOSIS — Z0000 Encounter for general adult medical examination without abnormal findings: Secondary | ICD-10-CM | POA: Diagnosis not present

## 2022-09-16 DIAGNOSIS — G629 Polyneuropathy, unspecified: Secondary | ICD-10-CM | POA: Diagnosis not present

## 2022-09-16 DIAGNOSIS — K219 Gastro-esophageal reflux disease without esophagitis: Secondary | ICD-10-CM | POA: Diagnosis not present

## 2022-09-16 DIAGNOSIS — M810 Age-related osteoporosis without current pathological fracture: Secondary | ICD-10-CM | POA: Diagnosis not present

## 2022-09-22 ENCOUNTER — Encounter: Payer: Self-pay | Admitting: Family Medicine

## 2022-09-22 ENCOUNTER — Other Ambulatory Visit: Payer: Self-pay | Admitting: Family Medicine

## 2022-09-22 DIAGNOSIS — Z1231 Encounter for screening mammogram for malignant neoplasm of breast: Secondary | ICD-10-CM

## 2022-09-22 DIAGNOSIS — M810 Age-related osteoporosis without current pathological fracture: Secondary | ICD-10-CM

## 2022-09-22 NOTE — Progress Notes (Signed)
Carelink Summary Report / Loop Recorder 

## 2022-09-29 ENCOUNTER — Ambulatory Visit (INDEPENDENT_AMBULATORY_CARE_PROVIDER_SITE_OTHER): Payer: Medicare HMO

## 2022-09-29 DIAGNOSIS — I639 Cerebral infarction, unspecified: Secondary | ICD-10-CM | POA: Diagnosis not present

## 2022-09-30 LAB — CUP PACEART REMOTE DEVICE CHECK
Date Time Interrogation Session: 20240708230627
Implantable Pulse Generator Implant Date: 20231215

## 2022-10-05 ENCOUNTER — Ambulatory Visit: Payer: Medicare HMO

## 2022-10-14 ENCOUNTER — Ambulatory Visit: Admission: RE | Admit: 2022-10-14 | Payer: Medicare HMO | Source: Ambulatory Visit

## 2022-10-14 ENCOUNTER — Ambulatory Visit
Admission: RE | Admit: 2022-10-14 | Discharge: 2022-10-14 | Disposition: A | Discharge status: Home / Self Care | Payer: Medicare HMO | Source: Ambulatory Visit | Attending: Family Medicine | Admitting: Family Medicine

## 2022-10-14 DIAGNOSIS — E349 Endocrine disorder, unspecified: Secondary | ICD-10-CM | POA: Diagnosis not present

## 2022-10-14 DIAGNOSIS — N958 Other specified menopausal and perimenopausal disorders: Secondary | ICD-10-CM | POA: Diagnosis not present

## 2022-10-14 DIAGNOSIS — M810 Age-related osteoporosis without current pathological fracture: Secondary | ICD-10-CM

## 2022-10-14 DIAGNOSIS — Z1231 Encounter for screening mammogram for malignant neoplasm of breast: Secondary | ICD-10-CM

## 2022-10-14 DIAGNOSIS — M8588 Other specified disorders of bone density and structure, other site: Secondary | ICD-10-CM | POA: Diagnosis not present

## 2022-10-19 DIAGNOSIS — M810 Age-related osteoporosis without current pathological fracture: Secondary | ICD-10-CM | POA: Diagnosis not present

## 2022-10-19 DIAGNOSIS — M1990 Unspecified osteoarthritis, unspecified site: Secondary | ICD-10-CM | POA: Diagnosis not present

## 2022-10-19 DIAGNOSIS — F3341 Major depressive disorder, recurrent, in partial remission: Secondary | ICD-10-CM | POA: Diagnosis not present

## 2022-10-19 DIAGNOSIS — K219 Gastro-esophageal reflux disease without esophagitis: Secondary | ICD-10-CM | POA: Diagnosis not present

## 2022-10-19 DIAGNOSIS — E7800 Pure hypercholesterolemia, unspecified: Secondary | ICD-10-CM | POA: Diagnosis not present

## 2022-10-22 NOTE — Progress Notes (Signed)
Carelink Summary Report / Loop Recorder 

## 2022-11-01 LAB — CUP PACEART REMOTE DEVICE CHECK
Date Time Interrogation Session: 20240810231002
Implantable Pulse Generator Implant Date: 20231215

## 2022-11-02 ENCOUNTER — Telehealth: Payer: Self-pay | Admitting: Neurology

## 2022-11-02 ENCOUNTER — Encounter: Payer: Self-pay | Admitting: Neurology

## 2022-11-02 ENCOUNTER — Ambulatory Visit (INDEPENDENT_AMBULATORY_CARE_PROVIDER_SITE_OTHER): Payer: Medicare HMO

## 2022-11-02 DIAGNOSIS — I639 Cerebral infarction, unspecified: Secondary | ICD-10-CM | POA: Diagnosis not present

## 2022-11-02 NOTE — Telephone Encounter (Signed)
Unable to LVM, no VM box. Sent letter in mail informing pt of need to reschedule 12/07/22 appt - MD out

## 2022-11-09 ENCOUNTER — Ambulatory Visit: Payer: Medicare HMO

## 2022-11-17 NOTE — Progress Notes (Signed)
Carelink Summary Report / Loop Recorder 

## 2022-12-06 LAB — CUP PACEART REMOTE DEVICE CHECK
Date Time Interrogation Session: 20240912230609
Implantable Pulse Generator Implant Date: 20231215

## 2022-12-07 ENCOUNTER — Institutional Professional Consult (permissible substitution): Payer: Medicare HMO | Admitting: Neurology

## 2022-12-07 ENCOUNTER — Ambulatory Visit (INDEPENDENT_AMBULATORY_CARE_PROVIDER_SITE_OTHER): Payer: Medicare HMO

## 2022-12-07 DIAGNOSIS — I639 Cerebral infarction, unspecified: Secondary | ICD-10-CM | POA: Diagnosis not present

## 2022-12-14 ENCOUNTER — Ambulatory Visit: Payer: Medicare HMO

## 2022-12-23 NOTE — Progress Notes (Signed)
Carelink Summary Report / Loop Recorder 

## 2023-01-11 ENCOUNTER — Ambulatory Visit: Payer: Medicare HMO

## 2023-01-11 DIAGNOSIS — I639 Cerebral infarction, unspecified: Secondary | ICD-10-CM | POA: Diagnosis not present

## 2023-01-12 LAB — CUP PACEART REMOTE DEVICE CHECK
Date Time Interrogation Session: 20241020231421
Implantable Pulse Generator Implant Date: 20231215

## 2023-01-18 ENCOUNTER — Ambulatory Visit: Payer: Medicare HMO

## 2023-01-27 ENCOUNTER — Encounter: Payer: Self-pay | Admitting: Neurology

## 2023-01-27 ENCOUNTER — Ambulatory Visit: Payer: Medicare HMO | Admitting: Neurology

## 2023-01-27 VITALS — BP 186/80 | HR 61 | Ht 63.0 in | Wt 113.0 lb

## 2023-01-27 DIAGNOSIS — G609 Hereditary and idiopathic neuropathy, unspecified: Secondary | ICD-10-CM | POA: Diagnosis not present

## 2023-01-27 DIAGNOSIS — R208 Other disturbances of skin sensation: Secondary | ICD-10-CM | POA: Diagnosis not present

## 2023-01-27 DIAGNOSIS — R4189 Other symptoms and signs involving cognitive functions and awareness: Secondary | ICD-10-CM

## 2023-01-27 DIAGNOSIS — I6310 Cerebral infarction due to embolism of unspecified precerebral artery: Secondary | ICD-10-CM | POA: Diagnosis not present

## 2023-01-27 MED ORDER — LIDOCAINE-PRILOCAINE 2.5-2.5 % EX CREA: 1.0000 | TOPICAL_CREAM | CUTANEOUS | 3 refills | Status: AC | PRN

## 2023-01-27 NOTE — Patient Instructions (Addendum)
Continue current medications  Will try patient on Emla cream for neuropathy Continue to follow-up with cardiology Follow-up in 1 year or sooner if worse.

## 2023-01-27 NOTE — Progress Notes (Signed)
[facility]  PATIENT: Susan Marsh DOB: May 02, 1940  REQUESTING CLINICIAN: Noberto Retort, MD HISTORY FROM: Patient/Daughter  REASON FOR VISIT: Right hemispheric stroke/Cognitive impairment    HISTORICAL  CHIEF COMPLAINT:  Chief Complaint  Patient presents with   New Patient (Initial Visit)    Rm12, daughter, granddaughter, & greatgranddaughter present, referral for Changes in cognition, hx of CVA / [name] MD [place]: mmse unable to complete due to hearing    HISTORY OF PRESENT ILLNESS:  This is a 82 year old woman past medical history of hypertension, hyperlipidemia, peripheral neuropathy, 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  Patient was seen in last year, March 2023 for neuropathy.  At that time we gave her Pregabalin and Ztlido, but this was not helpful in terms of her neuropathy and the Pregabalin made her sleepy therefore she self discontinued the medication.  In December she presented to the hospital due to left-sided weakness and confusion and found to have multiple, mainly right hemispheric punctate strokes, there was also left hemispheric strokes.  Stroke etiology likely cardioembolic.  Patient was started on aspirin Plavix for total of 21 days and recommended to continue with aspirin thereafter.  She also has a loop recorder but so far no evidence of atrial fibrillation.  Following her stroke she was in rehab, daughter tells me that she did extremely well and now is home.  While she was in the hospital, she did have some cognitive changes but again daughter tells me that she has improved tremendously.  She is hard of hearing therefore she does not understand quite well the conversation or questions. She lives home and one of her son lives there and she is independent in all activities of daily living.  She still drives but very short distance. Family and patient are not worried about her memory or  cognition. They have an appointment with audiologist to have hearing aids adjusted.    OTHER MEDICAL CONDITIONS: Right hemispheric strokes, Hypertension, hyperlipidemia, Neuropathy.    REVIEW OF SYSTEMS: Full 14 system review of systems performed and negative with exception of: As noted in the HPI  ALLERGIES: Allergies  Allergen Reactions   Alendronate Sodium     Other reaction(s): GI upset   Other     Cortisporin otic Other reaction(s): GI upset    HOME MEDICATIONS: Outpatient Medications Prior to Visit  Medication Sig Dispense Refill   acetaminophen (TYLENOL) 325 MG tablet Take 650 mg by mouth every 6 (six) hours as needed for headache.     amLODipine (NORVASC) 10 MG tablet Take 10 mg by mouth daily.     aspirin EC 81 MG tablet Take 1 tablet (81 mg total) by mouth daily. Swallow whole. 30 tablet 12   busPIRone (BUSPAR) 10 MG tablet Take 5-10 mg by mouth 2 (two) times daily.     famotidine (PEPCID) 20 MG tablet Take 1 tablet (20 mg total) by mouth 2 (two) times daily. 60 tablet 0   metoprolol (LOPRESSOR) 50 MG tablet Take 50 mg by mouth 2 (two) times daily.     ondansetron (ZOFRAN-ODT) 4 MG disintegrating tablet Take 4 mg by mouth 3 (three) times daily.     pravastatin (PRAVACHOL) 40 MG tablet Take 40 mg by mouth daily.     No facility-administered medications prior to visit.    PAST MEDICAL HISTORY: Past Medical History:  Diagnosis Date   Aortic stenosis    ASCVD (arteriosclerotic cardiovascular disease)    s/p  stent   GERD (gastroesophageal reflux disease)    Hearing loss    R>L   High cholesterol    Hypertension    Osteoarthritis     PAST SURGICAL HISTORY: Past Surgical History:  Procedure Laterality Date   BLADDER SURGERY     CARDIAC SURGERY     LOOP RECORDER INSERTION N/A 03/06/2022   Procedure: LOOP RECORDER INSERTION;  Surgeon: Duke Salvia, MD;  Location: [facility] INVASIVE CV LAB;  Service: Cardiovascular;  Laterality: N/A;    FAMILY HISTORY: Family  History  Problem Relation Age of Onset   Other Mother        unsure of history   Other Father        unsure of history   Heart disease Brother     SOCIAL HISTORY: Social History   Socioeconomic History   Marital status: Unknown    Spouse name: Not on file   Number of children: 4   Years of education: 12   Highest education level: High school graduate  Occupational History   Occupation: Retired  Tobacco Use   Smoking status: Never   Smokeless tobacco: Not on file  Substance and Sexual Activity   Alcohol use: Never   Drug use: Never   Sexual activity: Not on file  Other Topics Concern   Not on file  Social History Narrative   Lives with son.   Right-handed.   No daily caffeine.   Social Determinants of Health   Financial Resource Strain: Not on file  Food Insecurity: No Food Insecurity (03/05/2022)   Hunger Vital Sign    Worried About Running Out of Food in the Last Year: Never true    Ran Out of Food in the Last Year: Never true  Transportation Needs: No Transportation Needs (03/05/2022)   PRAPARE - Administrator, Civil Service (Medical): No    Lack of Transportation (Non-Medical): No  Physical Activity: Not on file  Stress: Not on file  Social Connections: Not on file  Intimate Partner Violence: Not At Risk (03/05/2022)   Humiliation, Afraid, Rape, and Kick questionnaire    Fear of Current or Ex-Partner: No    Emotionally Abused: No    Physically Abused: No    Sexually Abused: No    PHYSICAL EXAM  GENERAL EXAM/CONSTITUTIONAL: Vitals:  Vitals:   01/27/23 1318 01/27/23 1326  BP: (!) 180/83 (!) 186/80  Pulse: 61   Weight: 113 lb (51.3 kg)   Height: 5\' 3"  (1.6 m)    Body mass index is 20.02 kg/m. Wt Readings from Last 3 Encounters:  01/27/23 113 lb (51.3 kg)  03/02/22 137 lb 5.6 oz (62.3 kg)  01/22/22 137 lb 5.6 oz (62.3 kg)   Patient is in no distress; well developed, nourished and groomed; neck is supple  MUSCULOSKELETAL: Gait,  strength, tone, movements noted in Neurologic exam below  NEUROLOGIC: MENTAL STATUS:     01/27/2023    1:23 PM  MMSE - Mini Mental State Exam  Not completed: Unable to complete  Orientation to time 4  Orientation to Place 5  Registration 3  Attention/ Calculation 0   awake, alert, oriented to person, place and time recent and remote memory intact normal attention and concentration language fluent, comprehension intact, naming intact. Very hard of hearing, difficult to complete MoCA due to hearing loss fund of knowledge appropriate  CRANIAL NERVE:  2nd, 3rd, 4th, 6th- visual fields full to confrontation, extraocular muscles intact, no nystagmus 5th - facial sensation  symmetric 7th - facial strength symmetric 8th - hearing intact 9th - palate elevates symmetrically, uvula midline 11th - shoulder shrug symmetric 12th - tongue protrusion midline  MOTOR:  normal bulk and tone, 4 to 4+/5 left upper and lower extremities when compared to the right.   SENSORY:  normal and symmetric to light touch, Increase sensation to pinprick Left leg when compared to right. Decrease  vibration in both lower extremities, left worse than right.  COORDINATION:  finger-nose-finger, fine finger movements normal  GAIT/STATION:  Ambulates with a cane    DIAGNOSTIC DATA (LABS, IMAGING, TESTING) - I reviewed patient records, labs, notes, testing and imaging myself where available.  Lab Results  Component Value Date   WBC 7.5 03/04/2022   HGB 13.2 03/04/2022   HCT 37.5 03/04/2022   MCV 87.4 03/04/2022   PLT 229 03/04/2022      Component Value Date/Time   NA 140 03/06/2022 0607   K 3.6 03/06/2022 0607   CL 108 03/06/2022 0607   CO2 19 (L) 03/06/2022 0607   GLUCOSE 100 (H) 03/06/2022 0607   BUN 17 03/06/2022 0607   CREATININE 0.70 03/06/2022 0607   CALCIUM 9.4 03/06/2022 0607   PROT 9.4 (H) 03/02/2022 1305   ALBUMIN 5.2 (H) 03/02/2022 1305   AST 20 03/02/2022 1305   ALT 28 03/02/2022  1305   ALKPHOS 67 03/02/2022 1305   BILITOT 1.1 03/02/2022 1305   GFRNONAA >60 03/06/2022 0607   GFRAA >60 02/18/2015 1818   Lab Results  Component Value Date   CHOL 112 03/03/2022   HDL 36 (L) 03/03/2022   LDLCALC 63 03/03/2022   TRIG 67 03/03/2022   CHOLHDL 3.1 03/03/2022   Lab Results  Component Value Date   HGBA1C 5.9 (H) 03/02/2022   No results found for: "VITAMINB12" Lab Results  Component Value Date   TSH 0.910 01/19/2022    MRI Brain without contrast 03/02/2022  1. Numerous small areas of acute ischemia throughout the right hemisphere, in multiple vascular territories. The greatest burden is in the right MCA territory. No hemorrhage or mass effect. 2. A few foci of acute ischemia in the left hemisphere, also in multiple vascular territories. 3. Pattern is most consistent with a central embolic source (heart or proximal aorta).    ASSESSMENT AND PLAN  82 y.o. year old female with hypertension, hyperlipidemia, peripheral neuropathy, recent right hemispheric strokes who is presenting for follow-up for her strokes and cognitive impairment.  In terms of the stroke, etiology likely cardioembolic but we have not found any evidence of atrial fibrillation.  She does have a loop recorder but so far no atrial fibrillation.  Plan for now is to continue with aspirin, continue monitoring heart rhythm and if they is any evidence of arrhythmia, patient would likely be put on anticoagulation.  In terms of her cognitive impairment, this was in the setting of bihemispheric strokes and possible UTI, since discharge from the hospital, completing rehab and being home, both family and patient do not have any major concerns about her cognitive impairment/memory.  They tell me that patient is independent.  She is however very hard of hearing, she could not complete the MoCA today due to hearing loss.  They plan to see their audiologist in the next couple weeks to get the hearing aid adjusted.  For  now continue current medications and I will see them in 1 year for follow-up or sooner if worse.   1. Cerebrovascular accident (CVA) due to embolism of precerebral  artery (HCC)   2. Idiopathic peripheral neuropathy   3. Allodynia   4. Cognitive impairment      Patient Instructions  Continue current medications  Will try patient on Emla cream for neuropathy Continue to follow-up with cardiology Follow-up in 1 year or sooner if worse.  No orders of the defined types were placed in this encounter.   Meds ordered this encounter  Medications   lidocaine-prilocaine (EMLA) cream    Sig: Apply 1 Application topically as needed.    Dispense:  30 g    Refill:  3    Please provide the 30 gm tube    Return in about 1 year (around 01/27/2024).  I have spent a total of 60 minutes dedicated to this patient today, preparing to see patient, performing a medically appropriate examination and evaluation, ordering tests and/or medications and procedures, and counseling and educating the patient/family/caregiver; independently interpreting result and communicating results to the family/patient/caregiver; and documenting clinical information in the electronic medical record.   Windell Norfolk, MD 01/27/2023, 5:18 PM  [facility] [address] [place], [place] [address] [phone]

## 2023-01-28 NOTE — Progress Notes (Signed)
Carelink Summary Report / Loop Recorder 

## 2023-02-15 ENCOUNTER — Ambulatory Visit (INDEPENDENT_AMBULATORY_CARE_PROVIDER_SITE_OTHER): Payer: Medicare HMO

## 2023-02-15 DIAGNOSIS — I639 Cerebral infarction, unspecified: Secondary | ICD-10-CM | POA: Diagnosis not present

## 2023-02-15 LAB — CUP PACEART REMOTE DEVICE CHECK
Date Time Interrogation Session: 20241122230927
Implantable Pulse Generator Implant Date: 20231215

## 2023-02-22 ENCOUNTER — Ambulatory Visit: Payer: Medicare HMO

## 2023-03-15 NOTE — Progress Notes (Signed)
Carelink Summary Report / Loop Recorder 

## 2023-03-22 ENCOUNTER — Ambulatory Visit (INDEPENDENT_AMBULATORY_CARE_PROVIDER_SITE_OTHER): Payer: Medicare HMO

## 2023-03-22 DIAGNOSIS — I639 Cerebral infarction, unspecified: Secondary | ICD-10-CM | POA: Diagnosis not present

## 2023-03-22 LAB — CUP PACEART REMOTE DEVICE CHECK
Date Time Interrogation Session: 20241227231119
Implantable Pulse Generator Implant Date: 20231215

## 2023-04-07 DIAGNOSIS — F419 Anxiety disorder, unspecified: Secondary | ICD-10-CM | POA: Diagnosis not present

## 2023-04-07 DIAGNOSIS — E7800 Pure hypercholesterolemia, unspecified: Secondary | ICD-10-CM | POA: Diagnosis not present

## 2023-04-07 DIAGNOSIS — G629 Polyneuropathy, unspecified: Secondary | ICD-10-CM | POA: Diagnosis not present

## 2023-04-07 DIAGNOSIS — K219 Gastro-esophageal reflux disease without esophagitis: Secondary | ICD-10-CM | POA: Diagnosis not present

## 2023-04-07 DIAGNOSIS — I679 Cerebrovascular disease, unspecified: Secondary | ICD-10-CM | POA: Diagnosis not present

## 2023-04-07 DIAGNOSIS — I10 Essential (primary) hypertension: Secondary | ICD-10-CM | POA: Diagnosis not present

## 2023-04-07 DIAGNOSIS — R7303 Prediabetes: Secondary | ICD-10-CM | POA: Diagnosis not present

## 2023-04-07 DIAGNOSIS — F3341 Major depressive disorder, recurrent, in partial remission: Secondary | ICD-10-CM | POA: Diagnosis not present

## 2023-04-17 ENCOUNTER — Inpatient Hospital Stay (HOSPITAL_BASED_OUTPATIENT_CLINIC_OR_DEPARTMENT_OTHER)
Admission: EM | Admit: 2023-04-17 | Discharge: 2023-04-22 | DRG: 481 | Disposition: A | Discharge status: Skilled Nursing Facility | Payer: Medicare HMO | Attending: Internal Medicine | Admitting: Internal Medicine

## 2023-04-17 ENCOUNTER — Other Ambulatory Visit: Payer: Self-pay

## 2023-04-17 ENCOUNTER — Emergency Department (HOSPITAL_BASED_OUTPATIENT_CLINIC_OR_DEPARTMENT_OTHER): Payer: Medicare HMO | Admitting: Radiology

## 2023-04-17 DIAGNOSIS — J449 Chronic obstructive pulmonary disease, unspecified: Secondary | ICD-10-CM | POA: Diagnosis present

## 2023-04-17 DIAGNOSIS — M6281 Muscle weakness (generalized): Secondary | ICD-10-CM | POA: Diagnosis not present

## 2023-04-17 DIAGNOSIS — W010XXA Fall on same level from slipping, tripping and stumbling without subsequent striking against object, initial encounter: Secondary | ICD-10-CM | POA: Diagnosis present

## 2023-04-17 DIAGNOSIS — R9431 Abnormal electrocardiogram [ECG] [EKG]: Secondary | ICD-10-CM | POA: Diagnosis not present

## 2023-04-17 DIAGNOSIS — M25462 Effusion, left knee: Secondary | ICD-10-CM | POA: Diagnosis not present

## 2023-04-17 DIAGNOSIS — D696 Thrombocytopenia, unspecified: Secondary | ICD-10-CM | POA: Diagnosis present

## 2023-04-17 DIAGNOSIS — M25552 Pain in left hip: Secondary | ICD-10-CM | POA: Diagnosis not present

## 2023-04-17 DIAGNOSIS — Y92002 Bathroom of unspecified non-institutional (private) residence single-family (private) house as the place of occurrence of the external cause: Secondary | ICD-10-CM

## 2023-04-17 DIAGNOSIS — I639 Cerebral infarction, unspecified: Secondary | ICD-10-CM | POA: Diagnosis not present

## 2023-04-17 DIAGNOSIS — W19XXXA Unspecified fall, initial encounter: Principal | ICD-10-CM | POA: Diagnosis present

## 2023-04-17 DIAGNOSIS — R278 Other lack of coordination: Secondary | ICD-10-CM | POA: Diagnosis not present

## 2023-04-17 DIAGNOSIS — S72142A Displaced intertrochanteric fracture of left femur, initial encounter for closed fracture: Principal | ICD-10-CM | POA: Diagnosis present

## 2023-04-17 DIAGNOSIS — Z8249 Family history of ischemic heart disease and other diseases of the circulatory system: Secondary | ICD-10-CM

## 2023-04-17 DIAGNOSIS — D62 Acute posthemorrhagic anemia: Secondary | ICD-10-CM | POA: Diagnosis not present

## 2023-04-17 DIAGNOSIS — Z955 Presence of coronary angioplasty implant and graft: Secondary | ICD-10-CM | POA: Diagnosis not present

## 2023-04-17 DIAGNOSIS — R279 Unspecified lack of coordination: Secondary | ICD-10-CM | POA: Diagnosis not present

## 2023-04-17 DIAGNOSIS — Z7982 Long term (current) use of aspirin: Secondary | ICD-10-CM | POA: Diagnosis not present

## 2023-04-17 DIAGNOSIS — Z79899 Other long term (current) drug therapy: Secondary | ICD-10-CM

## 2023-04-17 DIAGNOSIS — M1712 Unilateral primary osteoarthritis, left knee: Secondary | ICD-10-CM | POA: Diagnosis not present

## 2023-04-17 DIAGNOSIS — Z01818 Encounter for other preprocedural examination: Secondary | ICD-10-CM | POA: Diagnosis not present

## 2023-04-17 DIAGNOSIS — I10 Essential (primary) hypertension: Secondary | ICD-10-CM | POA: Diagnosis present

## 2023-04-17 DIAGNOSIS — K219 Gastro-esophageal reflux disease without esophagitis: Secondary | ICD-10-CM | POA: Diagnosis present

## 2023-04-17 DIAGNOSIS — E7800 Pure hypercholesterolemia, unspecified: Secondary | ICD-10-CM | POA: Diagnosis present

## 2023-04-17 DIAGNOSIS — I2510 Atherosclerotic heart disease of native coronary artery without angina pectoris: Secondary | ICD-10-CM | POA: Diagnosis present

## 2023-04-17 DIAGNOSIS — H9193 Unspecified hearing loss, bilateral: Secondary | ICD-10-CM | POA: Diagnosis present

## 2023-04-17 DIAGNOSIS — S72142D Displaced intertrochanteric fracture of left femur, subsequent encounter for closed fracture with routine healing: Secondary | ICD-10-CM | POA: Diagnosis not present

## 2023-04-17 DIAGNOSIS — R2689 Other abnormalities of gait and mobility: Secondary | ICD-10-CM | POA: Diagnosis not present

## 2023-04-17 DIAGNOSIS — Z8673 Personal history of transient ischemic attack (TIA), and cerebral infarction without residual deficits: Secondary | ICD-10-CM | POA: Diagnosis not present

## 2023-04-17 DIAGNOSIS — Z7401 Bed confinement status: Secondary | ICD-10-CM | POA: Diagnosis not present

## 2023-04-17 DIAGNOSIS — S72143A Displaced intertrochanteric fracture of unspecified femur, initial encounter for closed fracture: Secondary | ICD-10-CM | POA: Diagnosis present

## 2023-04-17 DIAGNOSIS — Z7901 Long term (current) use of anticoagulants: Secondary | ICD-10-CM

## 2023-04-17 DIAGNOSIS — S72141A Displaced intertrochanteric fracture of right femur, initial encounter for closed fracture: Secondary | ICD-10-CM | POA: Diagnosis not present

## 2023-04-17 DIAGNOSIS — E876 Hypokalemia: Secondary | ICD-10-CM | POA: Diagnosis present

## 2023-04-17 DIAGNOSIS — S72002A Fracture of unspecified part of neck of left femur, initial encounter for closed fracture: Secondary | ICD-10-CM | POA: Diagnosis present

## 2023-04-17 DIAGNOSIS — R531 Weakness: Secondary | ICD-10-CM | POA: Diagnosis not present

## 2023-04-17 DIAGNOSIS — M25562 Pain in left knee: Secondary | ICD-10-CM | POA: Diagnosis not present

## 2023-04-17 LAB — CBC
HCT: 37.6 % (ref 36.0–46.0)
Hemoglobin: 12.7 g/dL (ref 12.0–15.0)
MCH: 30.2 pg (ref 26.0–34.0)
MCHC: 33.8 g/dL (ref 30.0–36.0)
MCV: 89.3 fL (ref 80.0–100.0)
Platelets: 189 10*3/uL (ref 150–400)
RBC: 4.21 MIL/uL (ref 3.87–5.11)
RDW: 13.7 % (ref 11.5–15.5)
WBC: 9.9 10*3/uL (ref 4.0–10.5)
nRBC: 0 % (ref 0.0–0.2)

## 2023-04-17 LAB — BASIC METABOLIC PANEL
Anion gap: 10 (ref 5–15)
BUN: 25 mg/dL — ABNORMAL HIGH (ref 8–23)
CO2: 27 mmol/L (ref 22–32)
Calcium: 9.5 mg/dL (ref 8.9–10.3)
Chloride: 103 mmol/L (ref 98–111)
Creatinine, Ser: 1.01 mg/dL — ABNORMAL HIGH (ref 0.44–1.00)
GFR, Estimated: 56 mL/min — ABNORMAL LOW (ref >60)
Glucose, Bld: 124 mg/dL — ABNORMAL HIGH (ref 70–99)
Potassium: 3.2 mmol/L — ABNORMAL LOW (ref 3.5–5.1)
Sodium: 140 mmol/L (ref 135–145)

## 2023-04-17 MED ORDER — ONDANSETRON HCL 4 MG/2ML IJ SOLN
4.0000 mg | Freq: Once | INTRAMUSCULAR | Status: AC
  Administered 2023-04-17: 4 mg via INTRAVENOUS
  Filled 2023-04-17: qty 2

## 2023-04-17 MED ORDER — MORPHINE SULFATE (PF) 2 MG/ML IV SOLN
2.0000 mg | Freq: Once | INTRAVENOUS | Status: AC
  Administered 2023-04-17: 2 mg via INTRAVENOUS
  Filled 2023-04-17: qty 1

## 2023-04-17 MED ORDER — POTASSIUM CHLORIDE 10 MEQ/100ML IV SOLN
10.0000 meq | INTRAVENOUS | Status: DC
  Administered 2023-04-17: 10 meq via INTRAVENOUS
  Filled 2023-04-17: qty 100

## 2023-04-17 NOTE — ED Triage Notes (Signed)
Slipped stepping out of tub. Landed on left hip and leg. Painful palpation to left knee and left hip. No head injury, no neck or back pain. No LOC, no thinners. CVA last year with some weakness lingering on left side.

## 2023-04-17 NOTE — ED Provider Notes (Signed)
[place] EMERGENCY DEPARTMENT AT [facility] Provider Note   CSN: [id number] Arrival date & time: 04/17/23  2026     History No chief complaint on file.   HPI Susan Marsh is a 83 y.o. female presenting for Chief omplaint of GLF. Obvious left hip deformity.  Larey Seat coming out of the bath.  Patient's recorded medical, surgical, social, medication list and allergies were reviewed in the Snapshot window as part of the initial history.   Review of Systems   Review of Systems  Constitutional:  Negative for chills and fever.  HENT:  Negative for ear pain and sore throat.   Eyes:  Negative for pain and visual disturbance.  Respiratory:  Negative for cough and shortness of breath.   Cardiovascular:  Negative for chest pain and palpitations.  Gastrointestinal:  Negative for abdominal pain and vomiting.  Genitourinary:  Negative for dysuria and hematuria.  Musculoskeletal:  Positive for gait problem. Negative for arthralgias and back pain.  Skin:  Negative for color change and rash.  Neurological:  Negative for seizures and syncope.  All other systems reviewed and are negative.   Physical Exam Updated Vital Signs BP (!) 153/76   Pulse 69   Temp 98.7 F (37.1 C) (Temporal)   Resp 18   SpO2 100%  Physical Exam Vitals and nursing note reviewed.  Constitutional:      General: She is not in acute distress.    Appearance: She is well-developed.  HENT:     Head: Normocephalic and atraumatic.  Eyes:     Conjunctiva/sclera: Conjunctivae normal.  Cardiovascular:     Rate and Rhythm: Normal rate and regular rhythm.     Heart sounds: No murmur heard. Pulmonary:     Effort: Pulmonary effort is normal. No respiratory distress.     Breath sounds: Normal breath sounds.  Abdominal:     General: There is no distension.     Palpations: Abdomen is soft.     Tenderness: There is no abdominal tenderness. There is no right CVA tenderness or left CVA tenderness.   Musculoskeletal:        General: Deformity (Obvious deformity to left him) present. No swelling or tenderness. Normal range of motion.     Cervical back: Neck supple.  Skin:    General: Skin is warm and dry.  Neurological:     General: No focal deficit present.     Mental Status: She is alert and oriented to person, place, and time. Mental status is at baseline.     Cranial Nerves: No cranial nerve deficit.      ED Course/ Medical Decision Making/ A&P    Procedures Procedures   Medications Ordered in ED Medications  morphine (PF) 2 MG/ML injection 2 mg (2 mg Intravenous Given 04/17/23 2213)  ondansetron (ZOFRAN) injection 4 mg (4 mg Intravenous Given 04/17/23 2212)   Medical Decision Making:   Susan Marsh is a 83 y.o. female who presented to the ED today with a fall at their living facility detailed above. They are not on a blood thinner. Patient placed on continuous vitals and telemetry monitoring while in ED which was reviewed periodically.   Complete initial physical exam performed, notably the patient  was hemodynamically stable  in no acute distress. Obvious deformity to left hip  Reviewed and confirmed nursing documentation for past medical history, family history, social history.    Initial Assessment/Plan:   This is a patient presenting with a moderate blunt mechanism trauma.  As such, I have considered intracranial injuries including intracranial hemorrhage, intrathoracic injuries including blunt myocardial or blunt lung injury, blunt abdominal injuries including aortic dissection, bladder injury, spleen injury, liver injury and I have considered orthopedic injuries including extremity or spinal injury. This is most consistent with an acute life/limb threatening illness complicated by underlying chronic conditions.  With the patient's presentation of moderate mechanism trauma but an otherwise reassuring exam, patient warrants targeted evaluation for potential  traumatic injuries. Will proceed with targeted evaluation for potential injuries.  Additionally, patient with pain at left hip. Will evaluate with targetted XR. Images reviewed and agree with radiology interpretation.  DG Femur Portable 1 View Left Result Date: 04/17/2023 CLINICAL DATA:  Status post fall with left hip pain EXAM: PELVIS - 1 VIEW; LEFT FEMUR PORTABLE 1 VIEW COMPARISON:  None Available. FINDINGS: There is no evidence of acute displaced pelvic fracture or diastasis. Comminuted fracture of the left intertrochanteric femur. No pelvic bone lesions are seen. Degenerative changes of the partially imaged lumbar spine, bilateral sacroiliac joints, and hips. Vascular calcifications. IMPRESSION: Comminuted left intertrochanteric fracture. No acute displaced pelvic fractures. Electronically Signed   By: Agustin Cree M.D.   On: 04/17/2023 21:56   DG Pelvis 1-2 Views Result Date: 04/17/2023 CLINICAL DATA:  Status post fall with left hip pain EXAM: PELVIS - 1 VIEW; LEFT FEMUR PORTABLE 1 VIEW COMPARISON:  None Available. FINDINGS: There is no evidence of acute displaced pelvic fracture or diastasis. Comminuted fracture of the left intertrochanteric femur. No pelvic bone lesions are seen. Degenerative changes of the partially imaged lumbar spine, bilateral sacroiliac joints, and hips. Vascular calcifications. IMPRESSION: Comminuted left intertrochanteric fracture. No acute displaced pelvic fractures. Electronically Signed   By: Agustin Cree M.D.   On: 04/17/2023 21:56   DG Knee 1-2 Views Left Result Date: 04/17/2023 CLINICAL DATA:  Fall onto left leg with knee pain EXAM: LEFT KNEE - 2 VIEW COMPARISON:  None Available. FINDINGS: The lateral most aspect of the knee and proximal fibula are not included within the field of view. No acute fracture or dislocation. Small joint effusion. Mild degenerative changes of the knee. IMPRESSION: No acute fracture or dislocation. Small joint effusion. Electronically Signed   By:  Agustin Cree M.D.   On: 04/17/2023 21:53    Final Reassessment and Plan:   Consulted orthopedic surgery who recommended n.p.o. at midnight for operative intervention in AM. ***.  Patient has a history of COPD and they recommended medical evaluation primary.  No anticoagulation. Disposition:   Based on the above findings, I believe this patient is stable for admission.    Patient/family educated about specific findings on our evaluation and explained exact reasons for admission.  Patient/family educated about clinical situation and time was allowed to answer questions.   Admission team communicated with and agreed with need for admission. Patient admitted. Patient  ready to move at this time.     Emergency Department Medication Summary:   Medications  morphine (PF) 2 MG/ML injection 2 mg (2 mg Intravenous Given 04/17/23 2213)  ondansetron (ZOFRAN) injection 4 mg (4 mg Intravenous Given 04/17/23 2212)           Clinical Impression:  1. Fall, initial encounter      Data Unavailable   Final Clinical Impression(s) / ED Diagnoses Final diagnoses:  Fall, initial encounter    Rx / DC Orders ED Discharge Orders     None

## 2023-04-18 ENCOUNTER — Inpatient Hospital Stay (HOSPITAL_COMMUNITY): Payer: Medicare HMO | Admitting: Anesthesiology

## 2023-04-18 ENCOUNTER — Ambulatory Visit: Payer: Self-pay | Admitting: Student

## 2023-04-18 ENCOUNTER — Encounter (HOSPITAL_COMMUNITY)
Admission: EM | Disposition: A | Discharge status: Skilled Nursing Facility | Payer: Self-pay | Source: Home / Self Care | Attending: Internal Medicine

## 2023-04-18 ENCOUNTER — Other Ambulatory Visit: Payer: Self-pay

## 2023-04-18 ENCOUNTER — Encounter (HOSPITAL_COMMUNITY): Payer: Self-pay | Admitting: Internal Medicine

## 2023-04-18 ENCOUNTER — Inpatient Hospital Stay (HOSPITAL_COMMUNITY): Payer: Medicare HMO

## 2023-04-18 DIAGNOSIS — D696 Thrombocytopenia, unspecified: Secondary | ICD-10-CM | POA: Diagnosis present

## 2023-04-18 DIAGNOSIS — Z7982 Long term (current) use of aspirin: Secondary | ICD-10-CM | POA: Diagnosis not present

## 2023-04-18 DIAGNOSIS — I2510 Atherosclerotic heart disease of native coronary artery without angina pectoris: Secondary | ICD-10-CM | POA: Diagnosis present

## 2023-04-18 DIAGNOSIS — J449 Chronic obstructive pulmonary disease, unspecified: Secondary | ICD-10-CM | POA: Diagnosis present

## 2023-04-18 DIAGNOSIS — W010XXA Fall on same level from slipping, tripping and stumbling without subsequent striking against object, initial encounter: Secondary | ICD-10-CM | POA: Diagnosis present

## 2023-04-18 DIAGNOSIS — E876 Hypokalemia: Secondary | ICD-10-CM | POA: Diagnosis present

## 2023-04-18 DIAGNOSIS — R9431 Abnormal electrocardiogram [ECG] [EKG]: Secondary | ICD-10-CM | POA: Diagnosis not present

## 2023-04-18 DIAGNOSIS — S72142A Displaced intertrochanteric fracture of left femur, initial encounter for closed fracture: Secondary | ICD-10-CM | POA: Diagnosis present

## 2023-04-18 DIAGNOSIS — Z79899 Other long term (current) drug therapy: Secondary | ICD-10-CM | POA: Diagnosis not present

## 2023-04-18 DIAGNOSIS — Z01818 Encounter for other preprocedural examination: Secondary | ICD-10-CM | POA: Diagnosis not present

## 2023-04-18 DIAGNOSIS — I10 Essential (primary) hypertension: Secondary | ICD-10-CM

## 2023-04-18 DIAGNOSIS — Z8249 Family history of ischemic heart disease and other diseases of the circulatory system: Secondary | ICD-10-CM | POA: Diagnosis not present

## 2023-04-18 DIAGNOSIS — S72141A Displaced intertrochanteric fracture of right femur, initial encounter for closed fracture: Secondary | ICD-10-CM | POA: Diagnosis not present

## 2023-04-18 DIAGNOSIS — S72002A Fracture of unspecified part of neck of left femur, initial encounter for closed fracture: Secondary | ICD-10-CM | POA: Diagnosis not present

## 2023-04-18 DIAGNOSIS — Z8673 Personal history of transient ischemic attack (TIA), and cerebral infarction without residual deficits: Secondary | ICD-10-CM | POA: Diagnosis not present

## 2023-04-18 DIAGNOSIS — W19XXXA Unspecified fall, initial encounter: Secondary | ICD-10-CM | POA: Diagnosis present

## 2023-04-18 DIAGNOSIS — Z955 Presence of coronary angioplasty implant and graft: Secondary | ICD-10-CM | POA: Diagnosis not present

## 2023-04-18 DIAGNOSIS — Y92002 Bathroom of unspecified non-institutional (private) residence single-family (private) house as the place of occurrence of the external cause: Secondary | ICD-10-CM | POA: Diagnosis not present

## 2023-04-18 DIAGNOSIS — E7800 Pure hypercholesterolemia, unspecified: Secondary | ICD-10-CM | POA: Diagnosis present

## 2023-04-18 DIAGNOSIS — K219 Gastro-esophageal reflux disease without esophagitis: Secondary | ICD-10-CM | POA: Diagnosis present

## 2023-04-18 DIAGNOSIS — Z7901 Long term (current) use of anticoagulants: Secondary | ICD-10-CM | POA: Diagnosis not present

## 2023-04-18 DIAGNOSIS — D62 Acute posthemorrhagic anemia: Secondary | ICD-10-CM | POA: Diagnosis not present

## 2023-04-18 DIAGNOSIS — H9193 Unspecified hearing loss, bilateral: Secondary | ICD-10-CM | POA: Diagnosis present

## 2023-04-18 HISTORY — PX: INTRAMEDULLARY (IM) NAIL INTERTROCHANTERIC: SHX5875

## 2023-04-18 LAB — BASIC METABOLIC PANEL
Anion gap: 9 (ref 5–15)
BUN: 29 mg/dL — ABNORMAL HIGH (ref 8–23)
CO2: 24 mmol/L (ref 22–32)
Calcium: 9.1 mg/dL (ref 8.9–10.3)
Chloride: 106 mmol/L (ref 98–111)
Creatinine, Ser: 1.01 mg/dL — ABNORMAL HIGH (ref 0.44–1.00)
GFR, Estimated: 56 mL/min — ABNORMAL LOW (ref >60)
Glucose, Bld: 144 mg/dL — ABNORMAL HIGH (ref 70–99)
Potassium: 3.6 mmol/L (ref 3.5–5.1)
Sodium: 139 mmol/L (ref 135–145)

## 2023-04-18 LAB — SURGICAL PCR SCREEN
MRSA, PCR: NEGATIVE
Staphylococcus aureus: NEGATIVE

## 2023-04-18 LAB — CBC
HCT: 31.4 % — ABNORMAL LOW (ref 36.0–46.0)
Hemoglobin: 10.3 g/dL — ABNORMAL LOW (ref 12.0–15.0)
MCH: 30.3 pg (ref 26.0–34.0)
MCHC: 32.8 g/dL (ref 30.0–36.0)
MCV: 92.4 fL (ref 80.0–100.0)
Platelets: 174 10*3/uL (ref 150–400)
RBC: 3.4 MIL/uL — ABNORMAL LOW (ref 3.87–5.11)
RDW: 13.8 % (ref 11.5–15.5)
WBC: 7.4 10*3/uL (ref 4.0–10.5)
nRBC: 0 % (ref 0.0–0.2)

## 2023-04-18 LAB — MAGNESIUM: Magnesium: 2 mg/dL (ref 1.7–2.4)

## 2023-04-18 SURGERY — FIXATION, FRACTURE, INTERTROCHANTERIC, WITH INTRAMEDULLARY ROD
Anesthesia: General | Site: Hip | Laterality: Left

## 2023-04-18 MED ORDER — PROPOFOL 10 MG/ML IV BOLUS
INTRAVENOUS | Status: AC
  Filled 2023-04-18: qty 20

## 2023-04-18 MED ORDER — LIDOCAINE HCL (PF) 2 % IJ SOLN
INTRAMUSCULAR | Status: AC
  Filled 2023-04-18: qty 5

## 2023-04-18 MED ORDER — PROCHLORPERAZINE EDISYLATE 10 MG/2ML IJ SOLN: 5.0000 mg | Freq: Four times a day (QID) | INTRAMUSCULAR | Status: DC | PRN

## 2023-04-18 MED ORDER — CHLORHEXIDINE GLUCONATE 4 % EX SOLN
60.0000 mL | Freq: Once | CUTANEOUS | Status: AC
  Administered 2023-04-18: 4 via TOPICAL
  Filled 2023-04-18: qty 60

## 2023-04-18 MED ORDER — METOCLOPRAMIDE HCL 5 MG PO TABS: 5.0000 mg | ORAL_TABLET | Freq: Three times a day (TID) | ORAL | Status: DC | PRN

## 2023-04-18 MED ORDER — HYDROMORPHONE HCL 1 MG/ML IJ SOLN
INTRAMUSCULAR | Status: AC
  Filled 2023-04-18: qty 1

## 2023-04-18 MED ORDER — CEFAZOLIN SODIUM-DEXTROSE 2-4 GM/100ML-% IV SOLN
2.0000 g | Freq: Four times a day (QID) | INTRAVENOUS | Status: AC
  Administered 2023-04-18 – 2023-04-19 (×2): 2 g via INTRAVENOUS
  Filled 2023-04-18 (×2): qty 100

## 2023-04-18 MED ORDER — FAMOTIDINE 20 MG PO TABS
20.0000 mg | ORAL_TABLET | Freq: Two times a day (BID) | ORAL | Status: DC
  Administered 2023-04-18 – 2023-04-22 (×8): 20 mg via ORAL
  Filled 2023-04-18 (×9): qty 1

## 2023-04-18 MED ORDER — MENTHOL 3 MG MT LOZG: 1.0000 | LOZENGE | OROMUCOSAL | Status: DC | PRN

## 2023-04-18 MED ORDER — ENSURE PRE-SURGERY PO LIQD
296.0000 mL | Freq: Once | ORAL | Status: AC
  Administered 2023-04-18: 296 mL via ORAL
  Filled 2023-04-18: qty 296

## 2023-04-18 MED ORDER — TRANEXAMIC ACID-NACL 1000-0.7 MG/100ML-% IV SOLN
INTRAVENOUS | Status: AC
  Filled 2023-04-18: qty 100

## 2023-04-18 MED ORDER — OXYCODONE HCL 5 MG PO TABS
2.5000 mg | ORAL_TABLET | ORAL | Status: DC | PRN
  Administered 2023-04-20 – 2023-04-22 (×2): 2.5 mg via ORAL
  Filled 2023-04-18 (×3): qty 1

## 2023-04-18 MED ORDER — METOCLOPRAMIDE HCL 5 MG/ML IJ SOLN: 5.0000 mg | Freq: Three times a day (TID) | INTRAMUSCULAR | Status: DC | PRN

## 2023-04-18 MED ORDER — METOPROLOL TARTRATE 50 MG PO TABS: 50.0000 mg | ORAL_TABLET | Freq: Two times a day (BID) | ORAL | Status: DC

## 2023-04-18 MED ORDER — DEXAMETHASONE SODIUM PHOSPHATE 10 MG/ML IJ SOLN
INTRAMUSCULAR | Status: DC | PRN
  Administered 2023-04-18: 10 mg via INTRAVENOUS

## 2023-04-18 MED ORDER — PHENYLEPHRINE HCL-NACL 20-0.9 MG/250ML-% IV SOLN
INTRAVENOUS | Status: AC
  Filled 2023-04-18: qty 250

## 2023-04-18 MED ORDER — ONDANSETRON HCL 4 MG/2ML IJ SOLN
INTRAMUSCULAR | Status: DC | PRN
  Administered 2023-04-18: 4 mg via INTRAVENOUS

## 2023-04-18 MED ORDER — ONDANSETRON HCL 4 MG PO TABS: 4.0000 mg | ORAL_TABLET | Freq: Four times a day (QID) | ORAL | Status: DC | PRN

## 2023-04-18 MED ORDER — MORPHINE SULFATE (PF) 2 MG/ML IV SOLN: 1.0000 mg | INTRAVENOUS | Status: DC | PRN

## 2023-04-18 MED ORDER — ACETAMINOPHEN 500 MG PO TABS: 500.0000 mg | ORAL_TABLET | Freq: Four times a day (QID) | ORAL | Status: DC | PRN

## 2023-04-18 MED ORDER — FENTANYL CITRATE (PF) 100 MCG/2ML IJ SOLN
INTRAMUSCULAR | Status: DC | PRN
  Administered 2023-04-18: 25 ug via INTRAVENOUS
  Administered 2023-04-18: 50 ug via INTRAVENOUS
  Administered 2023-04-18: 25 ug via INTRAVENOUS

## 2023-04-18 MED ORDER — SUGAMMADEX SODIUM 200 MG/2ML IV SOLN
INTRAVENOUS | Status: DC | PRN
  Administered 2023-04-18: 100 mg via INTRAVENOUS
  Administered 2023-04-18: 50 mg via INTRAVENOUS

## 2023-04-18 MED ORDER — DOCUSATE SODIUM 100 MG PO CAPS
100.0000 mg | ORAL_CAPSULE | Freq: Two times a day (BID) | ORAL | Status: DC
Start: 2023-04-18 — End: 2023-04-22
  Administered 2023-04-18 – 2023-04-22 (×8): 100 mg via ORAL
  Filled 2023-04-18 (×9): qty 1

## 2023-04-18 MED ORDER — POTASSIUM CHLORIDE IN NACL 20-0.9 MEQ/L-% IV SOLN
INTRAVENOUS | Status: AC
  Filled 2023-04-18 (×3): qty 1000

## 2023-04-18 MED ORDER — LACTATED RINGERS IV SOLN: INTRAVENOUS | Status: DC | PRN

## 2023-04-18 MED ORDER — ACETAMINOPHEN 500 MG PO TABS
1000.0000 mg | ORAL_TABLET | Freq: Three times a day (TID) | ORAL | Status: DC
  Administered 2023-04-18 – 2023-04-22 (×12): 1000 mg via ORAL
  Filled 2023-04-18 (×12): qty 2

## 2023-04-18 MED ORDER — HYDROMORPHONE HCL 1 MG/ML IJ SOLN
0.5000 mg | INTRAMUSCULAR | Status: DC | PRN
  Administered 2023-04-18: 0.5 mg via INTRAVENOUS

## 2023-04-18 MED ORDER — ACETAMINOPHEN 500 MG PO TABS: 1000.0000 mg | ORAL_TABLET | Freq: Once | ORAL | Status: DC

## 2023-04-18 MED ORDER — PHENYLEPHRINE HCL-NACL 20-0.9 MG/250ML-% IV SOLN
INTRAVENOUS | Status: DC | PRN
  Administered 2023-04-18: 30 ug/min via INTRAVENOUS

## 2023-04-18 MED ORDER — METHOCARBAMOL 500 MG PO TABS
500.0000 mg | ORAL_TABLET | Freq: Four times a day (QID) | ORAL | Status: DC | PRN
Start: 2023-04-18 — End: 2023-04-22
  Administered 2023-04-20 – 2023-04-22 (×5): 500 mg via ORAL
  Filled 2023-04-18 (×5): qty 1

## 2023-04-18 MED ORDER — ONDANSETRON HCL 4 MG/2ML IJ SOLN: 4.0000 mg | Freq: Four times a day (QID) | INTRAMUSCULAR | Status: DC | PRN

## 2023-04-18 MED ORDER — POVIDONE-IODINE 10 % EX SWAB: 2.0000 | Freq: Once | CUTANEOUS | Status: DC

## 2023-04-18 MED ORDER — POLYETHYLENE GLYCOL 3350 17 G PO PACK
17.0000 g | PACK | Freq: Every day | ORAL | Status: DC | PRN
  Administered 2023-04-21: 17 g via ORAL
  Filled 2023-04-18: qty 1

## 2023-04-18 MED ORDER — FENTANYL CITRATE (PF) 100 MCG/2ML IJ SOLN
INTRAMUSCULAR | Status: AC
  Filled 2023-04-18: qty 2

## 2023-04-18 MED ORDER — PHENOL 1.4 % MT LIQD: 1.0000 | OROMUCOSAL | Status: DC | PRN

## 2023-04-18 MED ORDER — FENTANYL CITRATE PF 50 MCG/ML IJ SOSY: 12.5000 ug | PREFILLED_SYRINGE | INTRAMUSCULAR | Status: DC | PRN

## 2023-04-18 MED ORDER — FENTANYL CITRATE PF 50 MCG/ML IJ SOSY: 25.0000 ug | PREFILLED_SYRINGE | INTRAMUSCULAR | Status: DC | PRN

## 2023-04-18 MED ORDER — LIDOCAINE HCL (CARDIAC) PF 100 MG/5ML IV SOSY
PREFILLED_SYRINGE | INTRAVENOUS | Status: DC | PRN
  Administered 2023-04-18: 60 mg via INTRAVENOUS

## 2023-04-18 MED ORDER — TRANEXAMIC ACID-NACL 1000-0.7 MG/100ML-% IV SOLN
1000.0000 mg | Freq: Once | INTRAVENOUS | Status: AC
  Administered 2023-04-18: 1000 mg via INTRAVENOUS
  Filled 2023-04-18: qty 100

## 2023-04-18 MED ORDER — PHENYLEPHRINE 80 MCG/ML (10ML) SYRINGE FOR IV PUSH (FOR BLOOD PRESSURE SUPPORT)
PREFILLED_SYRINGE | INTRAVENOUS | Status: DC | PRN
  Administered 2023-04-18: 160 ug via INTRAVENOUS

## 2023-04-18 MED ORDER — APIXABAN 2.5 MG PO TABS
2.5000 mg | ORAL_TABLET | Freq: Two times a day (BID) | ORAL | Status: DC
Start: 2023-04-19 — End: 2023-04-19
  Administered 2023-04-19: 2.5 mg via ORAL
  Filled 2023-04-18: qty 1

## 2023-04-18 MED ORDER — SERTRALINE HCL 100 MG PO TABS
100.0000 mg | ORAL_TABLET | Freq: Every day | ORAL | Status: DC
  Administered 2023-04-19 – 2023-04-22 (×4): 100 mg via ORAL
  Filled 2023-04-18 (×4): qty 1

## 2023-04-18 MED ORDER — AMLODIPINE BESYLATE 10 MG PO TABS: 10.0000 mg | ORAL_TABLET | Freq: Every day | ORAL | Status: DC

## 2023-04-18 MED ORDER — ROCURONIUM BROMIDE 10 MG/ML (PF) SYRINGE
PREFILLED_SYRINGE | INTRAVENOUS | Status: AC
  Filled 2023-04-18: qty 10

## 2023-04-18 MED ORDER — PRAVASTATIN SODIUM 20 MG PO TABS
40.0000 mg | ORAL_TABLET | Freq: Every day | ORAL | Status: DC
  Administered 2023-04-18 – 2023-04-22 (×5): 40 mg via ORAL
  Filled 2023-04-18 (×5): qty 2

## 2023-04-18 MED ORDER — ACETAMINOPHEN 10 MG/ML IV SOLN
INTRAVENOUS | Status: AC
  Filled 2023-04-18: qty 100

## 2023-04-18 MED ORDER — PROPOFOL 10 MG/ML IV BOLUS
INTRAVENOUS | Status: DC | PRN
  Administered 2023-04-18: 50 mg via INTRAVENOUS

## 2023-04-18 MED ORDER — CEFAZOLIN SODIUM-DEXTROSE 2-4 GM/100ML-% IV SOLN
INTRAVENOUS | Status: AC
  Filled 2023-04-18: qty 100

## 2023-04-18 MED ORDER — METHOCARBAMOL 1000 MG/10ML IJ SOLN: 500.0000 mg | Freq: Four times a day (QID) | INTRAMUSCULAR | Status: DC | PRN

## 2023-04-18 MED ORDER — 0.9 % SODIUM CHLORIDE (POUR BTL) OPTIME
TOPICAL | Status: DC | PRN
  Administered 2023-04-18: 1000 mL

## 2023-04-18 MED ORDER — ROCURONIUM BROMIDE 10 MG/ML (PF) SYRINGE
PREFILLED_SYRINGE | INTRAVENOUS | Status: DC | PRN
  Administered 2023-04-18: 40 mg via INTRAVENOUS

## 2023-04-18 MED ORDER — BUSPIRONE HCL 10 MG PO TABS
10.0000 mg | ORAL_TABLET | Freq: Every day | ORAL | Status: DC
  Administered 2023-04-18 – 2023-04-21 (×4): 10 mg via ORAL
  Filled 2023-04-18 (×4): qty 1

## 2023-04-18 MED ORDER — TRANEXAMIC ACID-NACL 1000-0.7 MG/100ML-% IV SOLN
1000.0000 mg | INTRAVENOUS | Status: AC
  Administered 2023-04-18: 1000 mg via INTRAVENOUS

## 2023-04-18 MED ORDER — POTASSIUM CHLORIDE IN NACL 20-0.9 MEQ/L-% IV SOLN
INTRAVENOUS | Status: DC
  Filled 2023-04-18: qty 1000

## 2023-04-18 MED ORDER — CEFAZOLIN SODIUM-DEXTROSE 2-4 GM/100ML-% IV SOLN
2.0000 g | INTRAVENOUS | Status: AC
  Administered 2023-04-18: 2 g via INTRAVENOUS

## 2023-04-18 SURGICAL SUPPLY — 37 items
BAG COUNTER SPONGE SURGICOUNT (BAG) IMPLANT
BAG ZIPLOCK 12X15 (MISCELLANEOUS) ×1 IMPLANT
BIT DRILL CANN LG 4.3MM (BIT) IMPLANT
BNDG GAUZE DERMACEA FLUFF 4 (GAUZE/BANDAGES/DRESSINGS) ×1 IMPLANT
COVER PERINEAL POST (MISCELLANEOUS) ×1 IMPLANT
COVER SURGICAL LIGHT HANDLE (MISCELLANEOUS) ×1 IMPLANT
DRAPE INCISE IOBAN 66X45 STRL (DRAPES) ×1 IMPLANT
DRESSING MEPILEX FLEX 4X4 (GAUZE/BANDAGES/DRESSINGS) ×2 IMPLANT
DRILL BIT CANN LG 4.3MM (BIT) ×1
DRSG AQUACEL AG ADV 3.5X 6 (GAUZE/BANDAGES/DRESSINGS) IMPLANT
DRSG MEPILEX FLEX 4X4 (GAUZE/BANDAGES/DRESSINGS) ×2
DRSG MEPILEX POST OP 4X8 (GAUZE/BANDAGES/DRESSINGS) ×1 IMPLANT
DURAPREP 26ML APPLICATOR (WOUND CARE) ×1 IMPLANT
ELECT REM PT RETURN 15FT ADLT (MISCELLANEOUS) ×1 IMPLANT
FACESHIELD WRAPAROUND (MASK) ×3 IMPLANT
FACESHIELD WRAPAROUND OR TEAM (MASK) ×3 IMPLANT
GLOVE BIO SURGEON STRL SZ 6.5 (GLOVE) ×2 IMPLANT
GLOVE BIO SURGEON STRL SZ7 (GLOVE) ×1 IMPLANT
GLOVE BIO SURGEON STRL SZ8 (GLOVE) ×3 IMPLANT
GLOVE BIOGEL PI IND STRL 7.0 (GLOVE) ×2 IMPLANT
GLOVE BIOGEL PI IND STRL 8 (GLOVE) ×2 IMPLANT
GOWN STRL REUS W/ TWL LRG LVL3 (GOWN DISPOSABLE) ×3 IMPLANT
GUIDEPIN VERSANAIL DSP 3.2X444 (ORTHOPEDIC DISPOSABLE SUPPLIES) IMPLANT
KIT BASIN OR (CUSTOM PROCEDURE TRAY) ×1 IMPLANT
KIT TURNOVER KIT A (KITS) IMPLANT
MANIFOLD NEPTUNE II (INSTRUMENTS) ×1 IMPLANT
NAIL HIP FRACT 130D 11X180 (Screw) IMPLANT
NS IRRIG 1000ML POUR BTL (IV SOLUTION) ×1 IMPLANT
PACK GENERAL/GYN (CUSTOM PROCEDURE TRAY) ×1 IMPLANT
PROTECTOR NERVE ULNAR (MISCELLANEOUS) ×1 IMPLANT
SCREW BONE CORTICAL 5.0X36 (Screw) IMPLANT
SCREW CANN THRD AFF 10.5X100 (Screw) IMPLANT
STAPLER SKIN PROX WIDE 3.9 (STAPLE) ×1 IMPLANT
SUT VIC AB 1 CT1 27XBRD ANTBC (SUTURE) ×1 IMPLANT
SUT VIC AB 2-0 CT1 TAPERPNT 27 (SUTURE) ×1 IMPLANT
TOWEL OR 17X26 10 PK STRL BLUE (TOWEL DISPOSABLE) ×1 IMPLANT
TRAY FOLEY MTR SLVR 16FR STAT (SET/KITS/TRAYS/PACK) IMPLANT

## 2023-04-18 NOTE — H&P (Signed)
History and Physical    POETRY CERRO ZOX:[id number] DOB: 03/23/1941 DOA: 04/17/2023  PCP: Noberto Retort, MD   Patient coming from: Home   Chief Complaint: Fall, left hip pain   HPI: [facility] is a 83 y.o. female with medical history significant for hypertension, hyperlipidemia, CAD with PCI in 2006, history of CVA in 2023, hearing loss, and SVT who presents with left hip pain after a fall at home.  Patient states that she was in her usual state of health when she was getting out of the bathtub tonight, slipped, and fell onto her left hip.  She reports severe left hip pain and inability to bear weight.  She denies hitting her head or losing consciousness.  She denies any history of asthma or COPD.  She does not experience chest discomfort with activity.  [facility] ED Course: Upon arrival to the ED, patient is found to be afebrile and saturating well on room air with normal heart rate and stable blood pressure.  Labs are most notable for potassium 3.2, creatinine 1.01, normal WBC, and normal hemoglobin.  Plain radiographs demonstrate comminuted left intertrochanteric femur fracture.   Orthopedic surgery was consulted by the ED physician, patient was treated with potassium, Zofran, and morphine, and she was transferred to [facility] for admission.  Review of Systems:  All other systems reviewed and apart from HPI, are negative.  Past Medical History:  Diagnosis Date   Aortic stenosis    ASCVD (arteriosclerotic cardiovascular disease)    s/p stent   GERD (gastroesophageal reflux disease)    Hearing loss    R>L   High cholesterol    Hypertension    Osteoarthritis     Past Surgical History:  Procedure Laterality Date   BLADDER SURGERY     CARDIAC SURGERY     LOOP RECORDER INSERTION N/A 03/06/2022   Procedure: LOOP RECORDER INSERTION;  Surgeon: Duke Salvia, MD;  Location: [facility] INVASIVE CV LAB;  Service: Cardiovascular;  Laterality: N/A;     Social History:   reports that she has never smoked. She does not have any smokeless tobacco history on file. She reports that she does not drink alcohol and does not use drugs.  Allergies  Allergen Reactions   Alendronate Sodium     Other reaction(s): GI upset   Other     Cortisporin otic Other reaction(s): GI upset    Family History  Problem Relation Age of Onset   Other Mother        unsure of history   Other Father        unsure of history   Heart disease Brother      Prior to Admission medications   Medication Sig Start Date End Date Taking? Authorizing Provider  acetaminophen (TYLENOL) 325 MG tablet Take 650 mg by mouth every 6 (six) hours as needed for headache.    [provider]  amLODipine (NORVASC) 10 MG tablet Take 10 mg by mouth daily. 02/18/21   [provider]  aspirin EC 81 MG tablet Take 1 tablet (81 mg total) by mouth daily. Swallow whole. 03/06/22   Azucena Fallen, MD  busPIRone (BUSPAR) 10 MG tablet Take 5-10 mg by mouth 2 (two) times daily. 02/26/22   [provider]  famotidine (PEPCID) 20 MG tablet Take 1 tablet (20 mg total) by mouth 2 (two) times daily. 01/23/22   Glade Lloyd, MD  lidocaine-prilocaine (EMLA) cream Apply 1 Application topically as needed. 01/27/23  Windell Norfolk, MD  metoprolol (LOPRESSOR) 50 MG tablet Take 50 mg by mouth 2 (two) times daily. 02/10/15   [provider]  ondansetron (ZOFRAN-ODT) 4 MG disintegrating tablet Take 4 mg by mouth 3 (three) times daily. 01/17/22   [provider]  pravastatin (PRAVACHOL) 40 MG tablet Take 40 mg by mouth daily. 02/10/15   [provider]    Physical Exam: Vitals:   04/18/23 0015 04/18/23 0030 04/18/23 0045 04/18/23 0121  BP:  (!) 113/58  136/64  Pulse: 71 62 62 65  Resp:    16  Temp:    98.5 F (36.9 C)  TempSrc:    Oral  SpO2: 95% 96% 96% 97%  Height:    5\' 3"  (1.6 m)    Constitutional: NAD, no pallor or diaphoresis    Eyes: PERTLA, lids and conjunctivae normal ENMT: Mucous membranes are moist. Posterior pharynx clear of any exudate or lesions.   Neck: supple, no masses  Respiratory: no wheezing, no crackles. No accessory muscle use.  Cardiovascular: S1 & S2 heard, regular rate and rhythm. No extremity edema.   Abdomen: No distension, no tenderness, soft. Bowel sounds active.  Musculoskeletal: no clubbing / cyanosis. Left hip tender; neurovascularly intact distally.   Skin: no significant rashes, lesions, ulcers. Warm, dry, well-perfused. Neurologic: CN 2-12 grossly intact aside from gross hearing loss. Moving all extremities. Alert and oriented to person, place, and situation.  Psychiatric: Calm. Cooperative.    Labs and Imaging on Admission: I have personally reviewed following labs and imaging studies  CBC: Recent Labs  Lab 04/17/23 2130  WBC 9.9  HGB 12.7  HCT 37.6  MCV 89.3  PLT 189   Basic Metabolic Panel: Recent Labs  Lab 04/17/23 2130  NA 140  K 3.2*  CL 103  CO2 27  GLUCOSE 124*  BUN 25*  CREATININE 1.01*  CALCIUM 9.5   GFR: CrCl cannot be calculated (Unknown ideal weight.). Liver Function Tests: No results for input(s): "AST", "ALT", "ALKPHOS", "BILITOT", "PROT", "ALBUMIN" in the last 168 hours. No results for input(s): "LIPASE", "AMYLASE" in the last 168 hours. No results for input(s): "AMMONIA" in the last 168 hours. Coagulation Profile: No results for input(s): "INR", "PROTIME" in the last 168 hours. Cardiac Enzymes: No results for input(s): "CKTOTAL", "CKMB", "CKMBINDEX", "TROPONINI" in the last 168 hours. BNP (last 3 results) No results for input(s): "PROBNP" in the last 8760 hours. HbA1C: No results for input(s): "HGBA1C" in the last 72 hours. CBG: No results for input(s): "GLUCAP" in the last 168 hours. Lipid Profile: No results for input(s): "CHOL", "HDL", "LDLCALC", "TRIG", "CHOLHDL", "LDLDIRECT" in the last 72 hours. Thyroid Function Tests: No results  for input(s): "TSH", "T4TOTAL", "FREET4", "T3FREE", "THYROIDAB" in the last 72 hours. Anemia Panel: No results for input(s): "VITAMINB12", "FOLATE", "FERRITIN", "TIBC", "IRON", "RETICCTPCT" in the last 72 hours. Urine analysis:    Component Value Date/Time   COLORURINE YELLOW 03/02/2022 1415   APPEARANCEUR CLEAR 03/02/2022 1415   LABSPEC 1.034 (H) 03/02/2022 1415   PHURINE 7.0 03/02/2022 1415   GLUCOSEU NEGATIVE 03/02/2022 1415   HGBUR NEGATIVE 03/02/2022 1415   BILIRUBINUR NEGATIVE 03/02/2022 1415   KETONESUR NEGATIVE 03/02/2022 1415   PROTEINUR TRACE (A) 03/02/2022 1415   NITRITE NEGATIVE 03/02/2022 1415   LEUKOCYTESUR LARGE (A) 03/02/2022 1415   Sepsis Labs: @LABRCNTIP (procalcitonin:4,lacticidven:4) ) Recent Results (from the past 240 hours)  Surgical pcr screen     Status: None   Collection Time: 04/18/23  1:32 AM   Specimen: Nasal  Mucosa; Nasal Swab  Result Value Ref Range Status   MRSA, PCR NEGATIVE NEGATIVE Final   Staphylococcus aureus NEGATIVE NEGATIVE Final    Comment: (NOTE) The Xpert SA Assay (FDA approved for NASAL specimens in patients 58 years of age and older), is one component of a comprehensive surveillance program. It is not intended to diagnose infection nor to guide or monitor treatment. Performed at [facility], [address], [place], [place] [address]      Radiological Exams on Admission: DG Femur Portable 1 View Left Result Date: 04/17/2023 CLINICAL DATA:  Status post fall with left hip pain EXAM: PELVIS - 1 VIEW; LEFT FEMUR PORTABLE 1 VIEW COMPARISON:  None Available. FINDINGS: There is no evidence of acute displaced pelvic fracture or diastasis. Comminuted fracture of the left intertrochanteric femur. No pelvic bone lesions are seen. Degenerative changes of the partially imaged lumbar spine, bilateral sacroiliac joints, and hips. Vascular calcifications. IMPRESSION: Comminuted left intertrochanteric fracture. No acute displaced  pelvic fractures. Electronically Signed   By: Agustin Cree M.D.   On: 04/17/2023 21:56   DG Pelvis 1-2 Views Result Date: 04/17/2023 CLINICAL DATA:  Status post fall with left hip pain EXAM: PELVIS - 1 VIEW; LEFT FEMUR PORTABLE 1 VIEW COMPARISON:  None Available. FINDINGS: There is no evidence of acute displaced pelvic fracture or diastasis. Comminuted fracture of the left intertrochanteric femur. No pelvic bone lesions are seen. Degenerative changes of the partially imaged lumbar spine, bilateral sacroiliac joints, and hips. Vascular calcifications. IMPRESSION: Comminuted left intertrochanteric fracture. No acute displaced pelvic fractures. Electronically Signed   By: Agustin Cree M.D.   On: 04/17/2023 21:56   DG Knee 1-2 Views Left Result Date: 04/17/2023 CLINICAL DATA:  Fall onto left leg with knee pain EXAM: LEFT KNEE - 2 VIEW COMPARISON:  None Available. FINDINGS: The lateral most aspect of the knee and proximal fibula are not included within the field of view. No acute fracture or dislocation. Small joint effusion. Mild degenerative changes of the knee. IMPRESSION: No acute fracture or dislocation. Small joint effusion. Electronically Signed   By: Agustin Cree M.D.   On: 04/17/2023 21:53    Assessment/Plan   1. Left hip fracture  - Pt has no new or worsening cardiac symptoms, has not had ACS or PCI since 2006, has estimated 0.9*% risk of perioperative MI or cardiac arrest, and does not have indication for preoperative cardiac evaluation  - Continue NPO, pain-control, supportive care, hold ASA, correct hypokalemia    2. Hypokalemia  - Replacing    3. CAD  - No anginal symptoms  - Continue statin and beta-blocker, hold ASA    4. HTN  - Norvasc, metoprolol    5. Hx of CVA  - Continue statin, hold ASA    6. Hx of SVT  - Metoprolol   7. Hx of AS  - No AS noted on most recent echo    DVT prophylaxis: SCDs  Code Status: Full  Level of Care: Level of care: Med-Surg Family Communication:  Son updated from ED  Disposition Plan:  Patient is from: home  Anticipated d/c is to: TBD Anticipated d/c date is: 04/22/23  Patient currently: Pending orthopedic surgery consultation and likely operative hip repair  Consults called: Orthopedic surgery  Admission status: Inpatient     Briscoe Deutscher, MD [facility]  04/18/2023, 3:45 AM

## 2023-04-18 NOTE — H&P (View-Only) (Signed)
Reason for Consult:Left intertrochanteric femur fracture Referring Physician: Dr Rocky Crafts Susan [facility] is an 83 y.o. female.  HPI: [name] is an 83 yo female who has a mechanical fall in her bathroom yesterday landing on her left side with immediate left hip pain and inability to get up. She did not have any dizziness or lightheadedness prior to the fall and did not sustain a loss of consciousness associated with the fall. Her only complaint is the left hip pain. She was taken to the [facility] and diagnosed with a left intertrochanteric femur fracture. Orthopaedics was consulted for management  Past Medical History:  Diagnosis Date   Aortic stenosis    ASCVD (arteriosclerotic cardiovascular disease)    s/p stent   GERD (gastroesophageal reflux disease)    Hearing loss    R>L   High cholesterol    Hypertension    Osteoarthritis     Past Surgical History:  Procedure Laterality Date   BLADDER SURGERY     CARDIAC SURGERY     LOOP RECORDER INSERTION N/A 03/06/2022   Procedure: LOOP RECORDER INSERTION;  Surgeon: Duke Salvia, MD;  Location: [facility] INVASIVE CV LAB;  Service: Cardiovascular;  Laterality: N/A;    Family History  Problem Relation Age of Onset   Other Mother        unsure of history   Other Father        unsure of history   Heart disease Brother     Social History:  reports that she has never smoked. She does not have any smokeless tobacco history on file. She reports that she does not drink alcohol and does not use drugs.  Allergies:  Allergies  Allergen Reactions   Alendronate Sodium     Other reaction(s): GI upset   Other     Cortisporin otic Other reaction(s): GI upset    Medications: I have reviewed the patient's current medications.  Results for orders placed or performed during the [facility] encounter of 04/17/23 (from the past 48 hours)  CBC     Status: None   Collection Time: 04/17/23  9:30 PM  Result Value Ref Range   WBC 9.9 4.0 -  10.5 K/uL   RBC 4.21 3.87 - 5.11 MIL/uL   Hemoglobin 12.7 12.0 - 15.0 g/dL   HCT 82.9 56.2 - 13.0 %   MCV 89.3 80.0 - 100.0 fL   MCH 30.2 26.0 - 34.0 pg   MCHC 33.8 30.0 - 36.0 g/dL   RDW 86.5 78.4 - 69.6 %   Platelets 189 150 - 400 K/uL   nRBC 0.0 0.0 - 0.2 %    Comment: Performed at [facility], [address], [place], [place] [address]  Basic metabolic panel     Status: Abnormal   Collection Time: 04/17/23  9:30 PM  Result Value Ref Range   Sodium 140 135 - 145 mmol/L   Potassium 3.2 (L) 3.5 - 5.1 mmol/L   Chloride 103 98 - 111 mmol/L   CO2 27 22 - 32 mmol/L   Glucose, Bld 124 (H) 70 - 99 mg/dL    Comment: Glucose reference range applies only to samples taken after fasting for at least 8 hours.   BUN 25 (H) 8 - 23 mg/dL   Creatinine, Ser 4.13 (H) 0.44 - 1.00 mg/dL   Calcium 9.5 8.9 - 24.4 mg/dL   GFR, Estimated 56 (L) >60 mL/min    Comment: (NOTE) Calculated using the CKD-EPI Creatinine Equation (2021)  Anion gap 10 5 - 15    Comment: Performed at [facility], [address] [place], [place] [address]  Surgical pcr screen     Status: None   Collection Time: 04/18/23  1:32 AM   Specimen: Nasal Mucosa; Nasal Swab  Result Value Ref Range   MRSA, PCR NEGATIVE NEGATIVE   Staphylococcus aureus NEGATIVE NEGATIVE    Comment: (NOTE) The Xpert SA Assay (FDA approved for NASAL specimens in patients 23 years of age and older), is one component of a comprehensive surveillance program. It is not intended to diagnose infection nor to guide or monitor treatment. Performed at [facility], [address] [place], [place] [address]   CBC     Status: Abnormal   Collection Time: 04/18/23  3:33 AM  Result Value Ref Range   WBC 7.4 4.0 - 10.5 K/uL   RBC 3.40 (L) 3.87 - 5.11 MIL/uL   Hemoglobin 10.3 (L) 12.0 - 15.0 g/dL   HCT 09.8 (L) 11.9 - 14.7 %   MCV 92.4 80.0 - 100.0 fL   MCH 30.3 26.0 - 34.0 pg   MCHC 32.8 30.0 -  36.0 g/dL   RDW 82.9 56.2 - 13.0 %   Platelets 174 150 - 400 K/uL   nRBC 0.0 0.0 - 0.2 %    Comment: Performed at [facility], [address], [place], [place] [address]  Basic metabolic panel     Status: Abnormal   Collection Time: 04/18/23  3:33 AM  Result Value Ref Range   Sodium 139 135 - 145 mmol/L   Potassium 3.6 3.5 - 5.1 mmol/L   Chloride 106 98 - 111 mmol/L   CO2 24 22 - 32 mmol/L   Glucose, Bld 144 (H) 70 - 99 mg/dL    Comment: Glucose reference range applies only to samples taken after fasting for at least 8 hours.   BUN 29 (H) 8 - 23 mg/dL   Creatinine, Ser 4.69 (H) 0.44 - 1.00 mg/dL   Calcium 9.1 8.9 - 62.9 mg/dL   GFR, Estimated 56 (L) >60 mL/min    Comment: (NOTE) Calculated using the CKD-EPI Creatinine Equation (2021)    Anion gap 9 5 - 15    Comment: Performed at [facility], [address] [place], [place] [address]  Magnesium     Status: None   Collection Time: 04/18/23  3:33 AM  Result Value Ref Range   Magnesium 2.0 1.7 - 2.4 mg/dL    Comment: Performed at [facility], [address], [place], [place] [address]  Type and screen [facility] [place] [facility]     Status: None   Collection Time: 04/18/23  3:34 AM  Result Value Ref Range   ABO/RH(D) O POS    Antibody Screen NEG    Sample Expiration      04/21/2023,2359 Performed at [facility], [address] [place], [place] [address]     DG Femur Portable 1 View Left Result Date: 04/17/2023 CLINICAL DATA:  Status post fall with left hip pain EXAM: PELVIS - 1 VIEW; LEFT FEMUR PORTABLE 1 VIEW COMPARISON:  None Available. FINDINGS: There is no evidence of acute displaced pelvic fracture or diastasis. Comminuted fracture of the left intertrochanteric femur. No pelvic bone lesions are seen. Degenerative changes of the partially imaged lumbar spine, bilateral sacroiliac joints, and hips. Vascular calcifications. IMPRESSION:  Comminuted left intertrochanteric fracture. No acute displaced pelvic fractures. Electronically Signed  By: Agustin Cree M.D.   On: 04/17/2023 21:56   DG Pelvis 1-2 Views Result Date: 04/17/2023 CLINICAL DATA:  Status post fall with left hip pain EXAM: PELVIS - 1 VIEW; LEFT FEMUR PORTABLE 1 VIEW COMPARISON:  None Available. FINDINGS: There is no evidence of acute displaced pelvic fracture or diastasis. Comminuted fracture of the left intertrochanteric femur. No pelvic bone lesions are seen. Degenerative changes of the partially imaged lumbar spine, bilateral sacroiliac joints, and hips. Vascular calcifications. IMPRESSION: Comminuted left intertrochanteric fracture. No acute displaced pelvic fractures. Electronically Signed   By: Agustin Cree M.D.   On: 04/17/2023 21:56   DG Knee 1-2 Views Left Result Date: 04/17/2023 CLINICAL DATA:  Fall onto left leg with knee pain EXAM: LEFT KNEE - 2 VIEW COMPARISON:  None Available. FINDINGS: The lateral most aspect of the knee and proximal fibula are not included within the field of view. No acute fracture or dislocation. Small joint effusion. Mild degenerative changes of the knee. IMPRESSION: No acute fracture or dislocation. Small joint effusion. Electronically Signed   By: Agustin Cree M.D.   On: 04/17/2023 21:53    Review of Systems Blood pressure (!) 112/55, pulse 71, temperature 98.7 F (37.1 C), resp. rate 16, height 5\' 3"  (1.6 m), weight 55 kg, SpO2 99%. Physical Exam Physical Examination: General appearance - alert, well appearing, and in no distress Mental status - alert, oriented to person, place, and time Chest - clear to auscultation, no wheezes, rales or rhonchi, symmetric air entry Heart - normal rate, regular rhythm, normal S1, S2, no murmurs, rubs, clicks or gallops Abdomen - soft, nontender, nondistended, no masses or organomegaly Neurological - alert, oriented, normal speech, no focal findings or movement disorder noted Left lower extremity  shortened and rotated; pulses, sensation and motor intact  X-ray with comminuted, displaced left intertrochanteric femur fracture   Assessment/Plan: Left intertrochanteric femur fracture- She has a displaced unstable fracture which will require surgical fixation for pain control and to allow for mobilization and ambulation. This will require an intramedullary fixation. We discussed this in detail including procedure, risks and potential complications and she elects to proceed  Ollen Gross 04/18/2023, 8:22 AM

## 2023-04-18 NOTE — Plan of Care (Signed)
Problem: Education: Goal: Knowledge of General Education information will improve Description: Including pain rating scale, medication(s)/side effects and non-pharmacologic comfort measures Outcome: Progressing   Problem: Clinical Measurements: Goal: Ability to maintain clinical measurements within normal limits will improve Outcome: Progressing   Problem: Coping: Goal: Level of anxiety will decrease Outcome: Progressing   Problem: Nutrition: Goal: Adequate nutrition will be maintained Outcome: Progressing   Problem: Pain Managment: Goal: General experience of comfort will improve and/or be controlled Outcome: Progressing   Haydee Salter, RN 04/18/23 8:01 PM

## 2023-04-18 NOTE — Consult Note (Signed)
Reason for Consult:Left intertrochanteric femur fracture Referring Physician: Dr Rocky Crafts Susan Marsh is an 83 y.o. female.  HPI: Susan Marsh is an 83 yo female who has a mechanical fall in her bathroom yesterday landing on her left side with immediate left hip pain and inability to get up. She did not have any dizziness or lightheadedness prior to the fall and did not sustain a loss of consciousness associated with the fall. Her only complaint is the left hip pain. She was taken to the [facility] ED and diagnosed with a left intertrochanteric femur fracture. Orthopaedics was consulted for management  Past Medical History:  Diagnosis Date   Aortic stenosis    ASCVD (arteriosclerotic cardiovascular disease)    s/p stent   GERD (gastroesophageal reflux disease)    Hearing loss    R>L   High cholesterol    Hypertension    Osteoarthritis     Past Surgical History:  Procedure Laterality Date   BLADDER SURGERY     CARDIAC SURGERY     LOOP RECORDER INSERTION N/A 03/06/2022   Procedure: LOOP RECORDER INSERTION;  Surgeon: Duke Salvia, MD;  Location: [facility] INVASIVE CV LAB;  Service: Cardiovascular;  Laterality: N/A;    Family History  Problem Relation Age of Onset   Other Mother        unsure of history   Other Father        unsure of history   Heart disease Brother     Social History:  reports that she has never smoked. She does not have any smokeless tobacco history on file. She reports that she does not drink alcohol and does not use drugs.  Allergies:  Allergies  Allergen Reactions   Alendronate Sodium     Other reaction(s): GI upset   Other     Cortisporin otic Other reaction(s): GI upset    Medications: I have reviewed the patient's current medications.  Results for orders placed or performed during the hospital encounter of 04/17/23 (from the past 48 hours)  CBC     Status: None   Collection Time: 04/17/23  9:30 PM  Result Value Ref Range   WBC 9.9 4.0 -  10.5 K/uL   RBC 4.21 3.87 - 5.11 MIL/uL   Hemoglobin 12.7 12.0 - 15.0 g/dL   HCT 82.9 56.2 - 13.0 %   MCV 89.3 80.0 - 100.0 fL   MCH 30.2 26.0 - 34.0 pg   MCHC 33.8 30.0 - 36.0 g/dL   RDW 86.5 78.4 - 69.6 %   Platelets 189 150 - 400 K/uL   nRBC 0.0 0.0 - 0.2 %    Comment: Performed at [facility], [address], [place], [place] [address]  Basic metabolic panel     Status: Abnormal   Collection Time: 04/17/23  9:30 PM  Result Value Ref Range   Sodium 140 135 - 145 mmol/L   Potassium 3.2 (L) 3.5 - 5.1 mmol/L   Chloride 103 98 - 111 mmol/L   CO2 27 22 - 32 mmol/L   Glucose, Bld 124 (H) 70 - 99 mg/dL    Comment: Glucose reference range applies only to samples taken after fasting for at least 8 hours.   BUN 25 (H) 8 - 23 mg/dL   Creatinine, Ser 4.13 (H) 0.44 - 1.00 mg/dL   Calcium 9.5 8.9 - 24.4 mg/dL   GFR, Estimated 56 (L) >60 mL/min    Comment: (NOTE) Calculated using the CKD-EPI Creatinine Equation (2021)  Anion gap 10 5 - 15    Comment: Performed at [facility], [address] [place], [place] [address]  Surgical pcr screen     Status: None   Collection Time: 04/18/23  1:32 AM   Specimen: Nasal Mucosa; Nasal Swab  Result Value Ref Range   MRSA, PCR NEGATIVE NEGATIVE   Staphylococcus aureus NEGATIVE NEGATIVE    Comment: (NOTE) The Xpert SA Assay (FDA approved for NASAL specimens in patients 23 years of age and older), is one component of a comprehensive surveillance program. It is not intended to diagnose infection nor to guide or monitor treatment. Performed at [facility], [address] [place], [place] [address]   CBC     Status: Abnormal   Collection Time: 04/18/23  3:33 AM  Result Value Ref Range   WBC 7.4 4.0 - 10.5 K/uL   RBC 3.40 (L) 3.87 - 5.11 MIL/uL   Hemoglobin 10.3 (L) 12.0 - 15.0 g/dL   HCT 09.8 (L) 11.9 - 14.7 %   MCV 92.4 80.0 - 100.0 fL   MCH 30.3 26.0 - 34.0 pg   MCHC 32.8 30.0 -  36.0 g/dL   RDW 82.9 56.2 - 13.0 %   Platelets 174 150 - 400 K/uL   nRBC 0.0 0.0 - 0.2 %    Comment: Performed at [facility], [address], [place], [place] [address]  Basic metabolic panel     Status: Abnormal   Collection Time: 04/18/23  3:33 AM  Result Value Ref Range   Sodium 139 135 - 145 mmol/L   Potassium 3.6 3.5 - 5.1 mmol/L   Chloride 106 98 - 111 mmol/L   CO2 24 22 - 32 mmol/L   Glucose, Bld 144 (H) 70 - 99 mg/dL    Comment: Glucose reference range applies only to samples taken after fasting for at least 8 hours.   BUN 29 (H) 8 - 23 mg/dL   Creatinine, Ser 4.69 (H) 0.44 - 1.00 mg/dL   Calcium 9.1 8.9 - 62.9 mg/dL   GFR, Estimated 56 (L) >60 mL/min    Comment: (NOTE) Calculated using the CKD-EPI Creatinine Equation (2021)    Anion gap 9 5 - 15    Comment: Performed at [facility], [address] [place], [place] [address]  Magnesium     Status: None   Collection Time: 04/18/23  3:33 AM  Result Value Ref Range   Magnesium 2.0 1.7 - 2.4 mg/dL    Comment: Performed at [facility], [address], [place], [place] [address]  Type and screen [facility]     Status: None   Collection Time: 04/18/23  3:34 AM  Result Value Ref Range   ABO/RH(D) O POS    Antibody Screen NEG    Sample Expiration      04/21/2023,2359 Performed at [facility], [address] [place], [place] [address]     DG Femur Portable 1 View Left Result Date: 04/17/2023 CLINICAL DATA:  Status post fall with left hip pain EXAM: PELVIS - 1 VIEW; LEFT FEMUR PORTABLE 1 VIEW COMPARISON:  None Available. FINDINGS: There is no evidence of acute displaced pelvic fracture or diastasis. Comminuted fracture of the left intertrochanteric femur. No pelvic bone lesions are seen. Degenerative changes of the partially imaged lumbar spine, bilateral sacroiliac joints, and hips. Vascular calcifications. IMPRESSION:  Comminuted left intertrochanteric fracture. No acute displaced pelvic fractures. Electronically Signed  By: Agustin Cree M.D.   On: 04/17/2023 21:56   DG Pelvis 1-2 Views Result Date: 04/17/2023 CLINICAL DATA:  Status post fall with left hip pain EXAM: PELVIS - 1 VIEW; LEFT FEMUR PORTABLE 1 VIEW COMPARISON:  None Available. FINDINGS: There is no evidence of acute displaced pelvic fracture or diastasis. Comminuted fracture of the left intertrochanteric femur. No pelvic bone lesions are seen. Degenerative changes of the partially imaged lumbar spine, bilateral sacroiliac joints, and hips. Vascular calcifications. IMPRESSION: Comminuted left intertrochanteric fracture. No acute displaced pelvic fractures. Electronically Signed   By: Agustin Cree M.D.   On: 04/17/2023 21:56   DG Knee 1-2 Views Left Result Date: 04/17/2023 CLINICAL DATA:  Fall onto left leg with knee pain EXAM: LEFT KNEE - 2 VIEW COMPARISON:  None Available. FINDINGS: The lateral most aspect of the knee and proximal fibula are not included within the field of view. No acute fracture or dislocation. Small joint effusion. Mild degenerative changes of the knee. IMPRESSION: No acute fracture or dislocation. Small joint effusion. Electronically Signed   By: Agustin Cree M.D.   On: 04/17/2023 21:53    Review of Systems Blood pressure (!) 112/55, pulse 71, temperature 98.7 F (37.1 C), resp. rate 16, height 5\' 3"  (1.6 m), weight 55 kg, SpO2 99%. Physical Exam Physical Examination: General appearance - alert, well appearing, and in no distress Mental status - alert, oriented to person, place, and time Chest - clear to auscultation, no wheezes, rales or rhonchi, symmetric air entry Heart - normal rate, regular rhythm, normal S1, S2, no murmurs, rubs, clicks or gallops Abdomen - soft, nontender, nondistended, no masses or organomegaly Neurological - alert, oriented, normal speech, no focal findings or movement disorder noted Left lower extremity  shortened and rotated; pulses, sensation and motor intact  X-ray with comminuted, displaced left intertrochanteric femur fracture   Assessment/Plan: Left intertrochanteric femur fracture- She has a displaced unstable fracture which will require surgical fixation for pain control and to allow for mobilization and ambulation. This will require an intramedullary fixation. We discussed this in detail including procedure, risks and potential complications and she elects to proceed  Ollen Gross 04/18/2023, 8:22 AM

## 2023-04-18 NOTE — Transfer of Care (Signed)
Immediate Anesthesia Transfer of Care Note  Patient: Susan Marsh  Procedure(s) Performed: INTRAMEDULLARY (IM) NAIL INTERTROCHANTERIC (Left: Hip)  Patient Location: PACU  Anesthesia Type:General  Level of Consciousness: awake and alert   Airway & Oxygen Therapy: Patient Spontanous Breathing and Patient connected to face mask oxygen  Post-op Assessment: Report given to RN and Post -op Vital signs reviewed and stable  Post vital signs: Reviewed and stable  Last Vitals:  Vitals Value Taken Time  BP 140/61 04/18/23 1330  Temp 36.8 C 04/18/23 1330  Pulse 76 04/18/23 1335  Resp 15 04/18/23 1335  SpO2 100 % 04/18/23 1335  Vitals shown include unfiled device data.  Last Pain:  Vitals:   04/18/23 1037  TempSrc:   PainSc: 3       Patients Stated Pain Goal: 2 (04/18/23 1037)  Complications: No notable events documented.

## 2023-04-18 NOTE — Op Note (Signed)
  OPERATIVE REPORT   PREOPERATIVE DIAGNOSIS: Left intertrochanteric femur fracture.   POSTOP DIAGNOSIS: Left intertrochanteric femur fracture.   PROCEDURE: Intramedullary nailing, Left intertrochanteric femur  fracture.   SURGEON: Ollen Gross, M.D.   ASSISTANT: None  ANESTHESIA:General  Estimated BLOOD LOSS: 150 ml  DRAINS: None.   COMPLICATIONS:   None  CONDITION: -PACU - hemodynamically stable.    CLINICAL NOTE: Susan Marsh is an 83 y.o. female, who had a fall yesterday  sustaining a displaced  Left intertrochanteric femur fracture. They have been cleared medically and present for operative fixation   PROCEDURE IN DETAIL: After successful administration of  General,  the patient was placed on the fracture table with Left lower extremity in a well-padded traction boot,  Right lower extremity in a well-padded leg holder. Under fluoroscopic guidance, the fracture wasreduced. The traction was locked in this position. Thigh was prepped  and draped in the usual sterile fashion. The guide pin for the Biomet  Affixus was then passed percutaneously to the tip of the greater  trochanter, and then entered into the femoral canal. It was passed into the  canal. The small incision was made and the starter reamer passed over  the guide pin. This was then removed. The nail which was an 11  mm  diameter short trochanteric nail with 130 degrees angle was attached to  the external guide and then passed into the femoral canal, impacted to  the appropriate depth in the canal, then we used the external guide to  place the lag screw. Through the external guide, a guide pin was  passed. Small incision made, and the guide pin was in the center of the  femoral head on the AP and slightly center to posterior on the lateral.  Length was 100 mm. Triple reamer was passed over the guide pin. 100 mm  lag screw was placed. It was then locked down with a locking screw.  Through the external guide,  the distal interlock was placed through the  static hole and this was 36 mm in length with excellent bicortical  purchase. The external guide was then removed. Hardware was in good  position and fracture was well reduced. Wound was copiously irrigated with saline  solution, and  closed deep with interrupted 1 Vicryl, subcu  interrupted 2-0 Vicryl, skin with staples Incision was  cleaned and dried and sterile dressings applied. The patient was awakened and  transported to recovery in stable condition.   Gus Rankin Nicolas Sisler, MD    04/18/2023, 1:20 PM

## 2023-04-18 NOTE — Progress Notes (Signed)
PROGRESS NOTE    Susan Marsh  GNF:[id number] DOB: 31-Mar-1940 DOA: 04/17/2023 PCP: Noberto Retort, MD    Brief Narrative: 83 year old female with a history of hypertension stroke in 2023, CAD with a PCI in 2006, SVT she was getting out of her bathtub when she slipped and fell onto her left hip.  She lives at home alone.no LOC chest pain headache dizziness prior to fall.  She was found to have a left intertrochanteric femur fracture.  Assessment & Plan:   Principal Problem:   Closed left hip fracture (HCC) Active Problems:   History of ischemic stroke   Hypokalemia   HTN (hypertension)   CAD (coronary artery disease)   #1 left intertrochanteric comminuted femur fracture seen by Ortho.  Appreciate assistance.  Plan for OR?  Today Has been n.p.o. not on any blood thinners Continue normal saline with 20 of KCl. Pain control with Tylenol Vitamin D level added for tomorrow  #2 hypertension she takes Norvasc 10 mg at home however her blood pressure is really soft here I will hold her Norvasc and metoprolol  #3 hypokalemia repleted recheck labs in AM. Check mag level.   #4 history of CAD and stroke on statin  Was on beta-blocker prior to admission on hold due to soft BP. Will add Lopressor as needed IV  #5 history of SVT  restart postop if blood pressure improves Rate is controlled Will start Lopressor as needed IV    Estimated body mass index is 21.48 kg/m as calculated from the following:   Height as of this encounter: 5\' 3"  (1.6 m).   Weight as of this encounter: 55 kg.  DVT prophylaxis: None 4 OR today  code Status: Full code  family Communication: None at bedside  disposition Plan:  Status is: Inpatient   Consultants:  Ortho  Procedures: None Antimicrobials: None   Subjective: Resting in bed no pain if she is not moving but severe pain with even mild movements. Ortho notes reviewed  Objective: Vitals:   04/18/23 0030 04/18/23 0045 04/18/23 0121  04/18/23 0558  BP: (!) 113/58  136/64 (!) 112/55  Pulse: 62 62 65 71  Resp:   16 16  Temp:   98.5 F (36.9 C) 98.7 F (37.1 C)  TempSrc:   Oral   SpO2: 96% 96% 97% 99%  Weight:   55 kg   Height:   5\' 3"  (1.6 m)     Intake/Output Summary (Last 24 hours) at 04/18/2023 0839 Last data filed at 04/18/2023 0600 Gross per 24 hour  Intake 544.23 ml  Output --  Net 544.23 ml   Filed Weights   04/18/23 0121  Weight: 55 kg    Examination:  General exam: Appears in no acute distress  respiratory system: Clear to auscultation. Respiratory effort normal. Cardiovascular system: Regular  gastrointestinal system: Abdomen is nondistended, soft and nontender. No organomegaly or masses felt. Normal bowel sounds heard. Central nervous system: Alert and oriented. No focal neurological deficits. Extremities: Left lower extremity short Right lower extremity no edema   Data Reviewed: I have personally reviewed following labs and imaging studies  CBC: Recent Labs  Lab 04/17/23 2130 04/18/23 0333  WBC 9.9 7.4  HGB 12.7 10.3*  HCT 37.6 31.4*  MCV 89.3 92.4  PLT 189 174   Basic Metabolic Panel: Recent Labs  Lab 04/17/23 2130 04/18/23 0333  NA 140 139  K 3.2* 3.6  CL 103 106  CO2 27 24  GLUCOSE 124* 144*  BUN  25* 29*  CREATININE 1.01* 1.01*  CALCIUM 9.5 9.1  MG  --  2.0   GFR: Estimated Creatinine Clearance: 35.5 mL/min (A) (by C-G formula based on SCr of 1.01 mg/dL (H)). Liver Function Tests: No results for input(s): "AST", "ALT", "ALKPHOS", "BILITOT", "PROT", "ALBUMIN" in the last 168 hours. No results for input(s): "LIPASE", "AMYLASE" in the last 168 hours. No results for input(s): "AMMONIA" in the last 168 hours. Coagulation Profile: No results for input(s): "INR", "PROTIME" in the last 168 hours. Cardiac Enzymes: No results for input(s): "CKTOTAL", "CKMB", "CKMBINDEX", "TROPONINI" in the last 168 hours. BNP (last 3 results) No results for input(s): "PROBNP" in the last  8760 hours. HbA1C: No results for input(s): "HGBA1C" in the last 72 hours. CBG: No results for input(s): "GLUCAP" in the last 168 hours. Lipid Profile: No results for input(s): "CHOL", "HDL", "LDLCALC", "TRIG", "CHOLHDL", "LDLDIRECT" in the last 72 hours. Thyroid Function Tests: No results for input(s): "TSH", "T4TOTAL", "FREET4", "T3FREE", "THYROIDAB" in the last 72 hours. Anemia Panel: No results for input(s): "VITAMINB12", "FOLATE", "FERRITIN", "TIBC", "IRON", "RETICCTPCT" in the last 72 hours. Sepsis Labs: No results for input(s): "PROCALCITON", "LATICACIDVEN" in the last 168 hours.  Recent Results (from the past 240 hours)  Surgical pcr screen     Status: None   Collection Time: 04/18/23  1:32 AM   Specimen: Nasal Mucosa; Nasal Swab  Result Value Ref Range Status   MRSA, PCR NEGATIVE NEGATIVE Final   Staphylococcus aureus NEGATIVE NEGATIVE Final    Comment: (NOTE) The Xpert SA Assay (FDA approved for NASAL specimens in patients 71 years of age and older), is one component of a comprehensive surveillance program. It is not intended to diagnose infection nor to guide or monitor treatment. Performed at [facility], [address] [place], [place] [address]          Radiology Studies: DG Femur Portable 1 View Left Result Date: 04/17/2023 CLINICAL DATA:  Status post fall with left hip pain EXAM: PELVIS - 1 VIEW; LEFT FEMUR PORTABLE 1 VIEW COMPARISON:  None Available. FINDINGS: There is no evidence of acute displaced pelvic fracture or diastasis. Comminuted fracture of the left intertrochanteric femur. No pelvic bone lesions are seen. Degenerative changes of the partially imaged lumbar spine, bilateral sacroiliac joints, and hips. Vascular calcifications. IMPRESSION: Comminuted left intertrochanteric fracture. No acute displaced pelvic fractures. Electronically Signed   By: Agustin Cree M.D.   On: 04/17/2023 21:56   DG Pelvis 1-2 Views Result Date:  04/17/2023 CLINICAL DATA:  Status post fall with left hip pain EXAM: PELVIS - 1 VIEW; LEFT FEMUR PORTABLE 1 VIEW COMPARISON:  None Available. FINDINGS: There is no evidence of acute displaced pelvic fracture or diastasis. Comminuted fracture of the left intertrochanteric femur. No pelvic bone lesions are seen. Degenerative changes of the partially imaged lumbar spine, bilateral sacroiliac joints, and hips. Vascular calcifications. IMPRESSION: Comminuted left intertrochanteric fracture. No acute displaced pelvic fractures. Electronically Signed   By: Agustin Cree M.D.   On: 04/17/2023 21:56   DG Knee 1-2 Views Left Result Date: 04/17/2023 CLINICAL DATA:  Fall onto left leg with knee pain EXAM: LEFT KNEE - 2 VIEW COMPARISON:  None Available. FINDINGS: The lateral most aspect of the knee and proximal fibula are not included within the field of view. No acute fracture or dislocation. Small joint effusion. Mild degenerative changes of the knee. IMPRESSION: No acute fracture or dislocation. Small joint effusion. Electronically Signed   By: Milus Height.D.  On: 04/17/2023 21:53    Scheduled Meds:  acetaminophen  1,000 mg Oral Once   amLODipine  10 mg Oral Daily   metoprolol tartrate  50 mg Oral BID   povidone-iodine  2 Application Topical Once   pravastatin  40 mg Oral Daily   Continuous Infusions:  0.9 % NaCl with KCl 20 mEq / L 75 mL/hr at 04/18/23 0339    ceFAZolin (ANCEF) IV     tranexamic acid       LOS: 0 days    Time spent: 39 min  Alwyn Ren, MD 04/18/2023, 8:39 AM

## 2023-04-18 NOTE — Anesthesia Preprocedure Evaluation (Addendum)
Anesthesia Evaluation  Patient identified by MRN, date of birth, ID band Patient awake    Reviewed: Allergy & Precautions, H&P , NPO status , Patient's Chart, lab work & pertinent test results, reviewed documented beta blocker date and time   Airway Mallampati: II  TM Distance: >3 FB Neck ROM: Full    Dental no notable dental hx. (+) Edentulous Upper, Edentulous Lower, Dental Advisory Given   Pulmonary neg pulmonary ROS   Pulmonary exam normal breath sounds clear to auscultation       Cardiovascular hypertension, Pt. on medications and Pt. on home beta blockers + CAD   Rhythm:Regular Rate:Normal + Systolic murmurs    Neuro/Psych negative neurological ROS  negative psych ROS   GI/Hepatic Neg liver ROS,GERD  Medicated,,  Endo/Other  negative endocrine ROS    Renal/GU negative Renal ROS  negative genitourinary   Musculoskeletal  (+) Arthritis , Osteoarthritis,    Abdominal   Peds  Hematology negative hematology ROS (+)   Anesthesia Other Findings   Reproductive/Obstetrics negative OB ROS                             Anesthesia Physical Anesthesia Plan  ASA: 3  Anesthesia Plan: General   Post-op Pain Management: Tylenol PO (pre-op)*   Induction: Intravenous  PONV Risk Score and Plan: 4 or greater and Ondansetron, Dexamethasone and Treatment may vary due to age or medical condition  Airway Management Planned: Oral ETT  Additional Equipment:   Intra-op Plan:   Post-operative Plan: Extubation in OR  Informed Consent: I have reviewed the patients History and Physical, chart, labs and discussed the procedure including the risks, benefits and alternatives for the proposed anesthesia with the patient or authorized representative who has indicated his/her understanding and acceptance.     Dental advisory given  Plan Discussed with: CRNA  Anesthesia Plan Comments:         Anesthesia Quick Evaluation

## 2023-04-18 NOTE — Interval H&P Note (Signed)
History and Physical Interval Note:  04/18/2023 11:41 AM  Susan Marsh  has presented today for surgery, with the diagnosis of Left intertrochanteric fracture.  The various methods of treatment have been discussed with the patient and family. After consideration of risks, benefits and other options for treatment, the patient has consented to  Procedure(s): INTRAMEDULLARY (IM) NAIL INTERTROCHANTERIC (Left) as a surgical intervention.  The patient's history has been reviewed, patient examined, no change in status, stable for surgery.  I have reviewed the patient's chart and labs.  Questions were answered to the patient's satisfaction.     Homero Fellers Pollie Poma

## 2023-04-18 NOTE — Anesthesia Procedure Notes (Signed)
Procedure Name: Intubation Date/Time: 04/18/2023 12:21 PM  Performed by: Oletha Cruel, CRNAPre-anesthesia Checklist: Patient identified, Emergency Drugs available, Suction available, Patient being monitored and Timeout performed Patient Re-evaluated:Patient Re-evaluated prior to induction Oxygen Delivery Method: Circle system utilized Preoxygenation: Pre-oxygenation with 100% oxygen Induction Type: IV induction Ventilation: Mask ventilation without difficulty Laryngoscope Size: Mac and 3 Grade View: Grade I Tube type: Oral Tube size: 7.0 mm Number of attempts: 1 Airway Equipment and Method: Stylet Placement Confirmation: ETT inserted through vocal cords under direct vision, positive ETCO2, CO2 detector and breath sounds checked- equal and bilateral Secured at: 20 (OETT positioned 22 cm at lower lip.) cm Tube secured with: Tape Comments: Atraumatic intubation. Patient edentulous. Lips intact.

## 2023-04-18 NOTE — Anesthesia Postprocedure Evaluation (Signed)
Anesthesia Post Note  Patient: Susan Marsh  Procedure(s) Performed: INTRAMEDULLARY (IM) NAIL INTERTROCHANTERIC (Left: Hip)     Patient location during evaluation: PACU Anesthesia Type: General Level of consciousness: awake and alert Pain management: pain level controlled Vital Signs Assessment: post-procedure vital signs reviewed and stable Respiratory status: spontaneous breathing, nonlabored ventilation, respiratory function stable and patient connected to nasal cannula oxygen Cardiovascular status: blood pressure returned to baseline and stable Postop Assessment: no apparent nausea or vomiting Anesthetic complications: no  No notable events documented.  Last Vitals:  Vitals:   04/18/23 1415 04/18/23 1441  BP: (!) 137/59 (!) 129/56  Pulse: 64 65  Resp: 10 16  Temp:  36.9 C  SpO2: 100% 100%    Last Pain:  Vitals:   04/18/23 1441  TempSrc: Oral  PainSc: 0-No pain                 Kobie Whidby,W. EDMOND

## 2023-04-18 NOTE — Progress Notes (Signed)
Hospitalist Transfer Note:    Nursing staff, Please call [facility] Admits & Consults System-Wide number on Amion [phone]) as soon as patient's arrival, so appropriate admitting provider can evaluate the pt.  Transferring facility: [facility] Requesting provider: Dr. Doran Durand (EDP at [facility]) Reason for transfer: admission for further evaluation and management of acute left hip fracture after ground-level mechanical fall at home.     83 year old female with history of essential hypertension, COPD,  who presented to [facility] ED complaining of acute left hip pain after ground-level mechanical fall at home in which she slipped while getting out of the bathtub, landing on her left hip as principal point of contact with the floor.  Did not hit her head and there was no associated loss of consciousness.  She is on a daily baby aspirin in the context of a history of prior stroke.  Imaging notable for plain films of the left hip which showed commuted left intertrochanteric hip fracture.  EDP has discussed patient's case with Dr. Linna Caprice of [facility], who conveys that [facility] will formally consult at [facility] specifically, and requests that patient be kept npo after MN.  Subsequently, I accepted this patient for transfer for inpatient admission to a MedSurg bed at [facility] for further work-up and management of the above.      Newton Pigg, DO Hospitalist

## 2023-04-19 ENCOUNTER — Inpatient Hospital Stay (HOSPITAL_COMMUNITY): Payer: Medicare HMO

## 2023-04-19 DIAGNOSIS — W19XXXA Unspecified fall, initial encounter: Secondary | ICD-10-CM | POA: Diagnosis not present

## 2023-04-19 DIAGNOSIS — R9431 Abnormal electrocardiogram [ECG] [EKG]: Secondary | ICD-10-CM

## 2023-04-19 DIAGNOSIS — I10 Essential (primary) hypertension: Secondary | ICD-10-CM | POA: Diagnosis not present

## 2023-04-19 LAB — ECHOCARDIOGRAM COMPLETE
AV Mean grad: 8 mm[Hg]
AV Peak grad: 13.2 mm[Hg]
Ao pk vel: 1.82 m/s
Area-P 1/2: 2.71 cm2
Calc EF: 75.3 %
Height: 63 in
P 1/2 time: 671 ms
S' Lateral: 2.2 cm
Single Plane A2C EF: 74.5 %
Single Plane A4C EF: 76.2 %
Weight: 1940.05 [oz_av]

## 2023-04-19 LAB — BASIC METABOLIC PANEL
Anion gap: 10 (ref 5–15)
BUN: 26 mg/dL — ABNORMAL HIGH (ref 8–23)
CO2: 21 mmol/L — ABNORMAL LOW (ref 22–32)
Calcium: 8.5 mg/dL — ABNORMAL LOW (ref 8.9–10.3)
Chloride: 107 mmol/L (ref 98–111)
Creatinine, Ser: 0.85 mg/dL (ref 0.44–1.00)
GFR, Estimated: >60 mL/min (ref >60)
Glucose, Bld: 142 mg/dL — ABNORMAL HIGH (ref 70–99)
Potassium: 3.9 mmol/L (ref 3.5–5.1)
Sodium: 138 mmol/L (ref 135–145)

## 2023-04-19 LAB — CBC
HCT: 24.3 % — ABNORMAL LOW (ref 36.0–46.0)
Hemoglobin: 8.1 g/dL — ABNORMAL LOW (ref 12.0–15.0)
MCH: 31.2 pg (ref 26.0–34.0)
MCHC: 33.3 g/dL (ref 30.0–36.0)
MCV: 93.5 fL (ref 80.0–100.0)
Platelets: 84 10*3/uL — ABNORMAL LOW (ref 150–400)
RBC: 2.6 MIL/uL — ABNORMAL LOW (ref 3.87–5.11)
RDW: 14.3 % (ref 11.5–15.5)
WBC: 8.3 10*3/uL (ref 4.0–10.5)
nRBC: 0 % (ref 0.0–0.2)

## 2023-04-19 LAB — ABO/RH: ABO/RH(D): O POS

## 2023-04-19 LAB — VITAMIN D 25 HYDROXY (VIT D DEFICIENCY, FRACTURES): Vit D, 25-Hydroxy: 36.64 ng/mL (ref 30–100)

## 2023-04-19 MED ORDER — ENSURE ENLIVE PO LIQD
237.0000 mL | Freq: Two times a day (BID) | ORAL | Status: DC
  Administered 2023-04-19 – 2023-04-21 (×6): 237 mL via ORAL

## 2023-04-19 NOTE — Progress Notes (Addendum)
PROGRESS NOTE    Susan Marsh  ZOX:[id number] DOB: [birth year] DOA: 04/17/2023 PCP: Noberto Retort, MD    Brief Narrative: 83 year old female with a history of hypertension stroke in 2023, CAD with a PCI in 2006, SVT she was getting out of her bathtub when she slipped and fell onto her left hip.  She lives at home alone.no LOC chest pain headache dizziness prior to fall.  She was found to have a left intertrochanteric femur fracture.  Assessment & Plan:   Principal Problem:   Closed fracture of intertrochanteric section of femur (HCC) Active Problems:   History of ischemic stroke   Hypokalemia   HTN (hypertension)   CAD (coronary artery disease)   Fall   #1 left intertrochanteric comminuted femur fracture seen by Ortho.  Status post intramedullary nailing of intertrochanteric fracture of the left hip on 04/18/2023.  Pain control with Tylenol Vitamin D level 36.64 DVT prophylaxis per Ortho however it is noted that her platelets dropped significantly to 84 from 174.  Consider holding Eliquis temporarily.  #2 hypertension has been stable off of Norvasc and metoprolol   #3 hypokalemia resolved magnesium 2.0   #4 history of CAD and stroke on statin  Was on beta-blocker prior to admission on hold due to soft BP. Will add Lopressor as needed IV  #5 history of SVT  restart postop if blood pressure improves Rate is controlled Will start Lopressor as needed IV  #6 thrombocytopenia platelets 84 from 174 ?  Hold Eliquis  #7 postop anemia versus hemodilution hemoglobin 8.1 from 10 in 24 hours will follow-up in a.m.  Estimated body mass index is 21.48 kg/m as calculated from the following:   Height as of this encounter: 5\' 3"  (1.6 m).   Weight as of this encounter: 55 kg.  DVT prophylaxis: Eliquis code Status: Full code  family Communication: None at bedside  disposition Plan:  Status is: Inpatient   Consultants:  Ortho  Procedures: None Antimicrobials: None    Subjective: Resting in bed pain is minimal denies any new complaints Objective: Vitals:   04/18/23 1806 04/18/23 2144 04/19/23 0206 04/19/23 0600  BP: (!) 112/58 136/66 (!) 118/55 130/74  Pulse: 72 73 79 84  Resp: 16 17 18 16   Temp: 98.9 F (37.2 C) 98.4 F (36.9 C) 98.2 F (36.8 C) 98 F (36.7 C)  TempSrc: Oral Oral Oral Oral  SpO2: 100% 100% 100% 99%  Weight:      Height:        Intake/Output Summary (Last 24 hours) at 04/19/2023 1214 Last data filed at 04/19/2023 1030 Gross per 24 hour  Intake 3321.57 ml  Output 600 ml  Net 2721.57 ml   Filed Weights   04/18/23 0121  Weight: 55 kg    Examination:  General exam: Appears in no acute distress  respiratory system: Clear to auscultation. Respiratory effort normal. Cardiovascular system: Regular  gastrointestinal system: Abdomen is nondistended, soft and nontender. No organomegaly or masses felt. Normal bowel sounds heard. Central nervous system: Alert and oriented. No focal neurological deficits. Extremities: Left hip incision covered with dressing clean dry intact Right lower extremity no edema   Data Reviewed: I have personally reviewed following labs and imaging studies  CBC: Recent Labs  Lab 04/17/23 2130 04/18/23 0333 04/19/23 0258  WBC 9.9 7.4 8.3  HGB 12.7 10.3* 8.1*  HCT 37.6 31.4* 24.3*  MCV 89.3 92.4 93.5  PLT 189 174 84*   Basic Metabolic Panel: Recent Labs  Lab 04/17/23  2130 04/18/23 0333 04/19/23 0258  NA 140 139 138  K 3.2* 3.6 3.9  CL 103 106 107  CO2 27 24 21*  GLUCOSE 124* 144* 142*  BUN 25* 29* 26*  CREATININE 1.01* 1.01* 0.85  CALCIUM 9.5 9.1 8.5*  MG  --  2.0  --    GFR: Estimated Creatinine Clearance: 42.2 mL/min (by C-G formula based on SCr of 0.85 mg/dL). Liver Function Tests: No results for input(s): "AST", "ALT", "ALKPHOS", "BILITOT", "PROT", "ALBUMIN" in the last 168 hours. No results for input(s): "LIPASE", "AMYLASE" in the last 168 hours. No results for input(s):  "AMMONIA" in the last 168 hours. Coagulation Profile: No results for input(s): "INR", "PROTIME" in the last 168 hours. Cardiac Enzymes: No results for input(s): "CKTOTAL", "CKMB", "CKMBINDEX", "TROPONINI" in the last 168 hours. BNP (last 3 results) No results for input(s): "PROBNP" in the last 8760 hours. HbA1C: No results for input(s): "HGBA1C" in the last 72 hours. CBG: No results for input(s): "GLUCAP" in the last 168 hours. Lipid Profile: No results for input(s): "CHOL", "HDL", "LDLCALC", "TRIG", "CHOLHDL", "LDLDIRECT" in the last 72 hours. Thyroid Function Tests: No results for input(s): "TSH", "T4TOTAL", "FREET4", "T3FREE", "THYROIDAB" in the last 72 hours. Anemia Panel: No results for input(s): "VITAMINB12", "FOLATE", "FERRITIN", "TIBC", "IRON", "RETICCTPCT" in the last 72 hours. Sepsis Labs: No results for input(s): "PROCALCITON", "LATICACIDVEN" in the last 168 hours.  Recent Results (from the past 240 hours)  Surgical pcr screen     Status: None   Collection Time: 04/18/23  1:32 AM   Specimen: Nasal Mucosa; Nasal Swab  Result Value Ref Range Status   MRSA, PCR NEGATIVE NEGATIVE Final   Staphylococcus aureus NEGATIVE NEGATIVE Final    Comment: (NOTE) The Xpert SA Assay (FDA approved for NASAL specimens in patients 37 years of age and older), is one component of a comprehensive surveillance program. It is not intended to diagnose infection nor to guide or monitor treatment. Performed at [facility], [address], [place], [place] [address]          Radiology Studies: DG FEMUR MIN 2 VIEWS LEFT Result Date: 04/18/2023 CLINICAL DATA:  Elective surgery. EXAM: LEFT FEMUR 2 VIEWS COMPARISON:  Preoperative imaging FINDINGS: Five fluoroscopic spot views of the left femur obtained in the operating room. Placement of femoral intramedullary nail with trans trochanteric and distal locking screw fixation traversing proximal femur fracture. Fluoroscopy time  29 seconds. Dose 6.2 mGy. IMPRESSION: Procedural fluoroscopy during femur fracture fixation. Electronically Signed   By: Narda Rutherford M.D.   On: 04/18/2023 14:45   DG C-Arm 1-60 Min-No Report Result Date: 04/18/2023 Fluoroscopy was utilized by the requesting physician.  No radiographic interpretation.   DG CHEST PORT 1 VIEW Result Date: 04/18/2023 CLINICAL DATA:  Left hip fracture.  Pre-op clearance exam EXAM: PORTABLE CHEST 1 VIEW COMPARISON:  01/20/2022 FINDINGS: The heart size and mediastinal contours are within normal limits. Both lungs are clear. Left chest wall cardiac loop recorder noted. IMPRESSION: No active disease. Electronically Signed   By: Danae Orleans M.D.   On: 04/18/2023 09:58   DG Femur Portable 1 View Left Result Date: 04/17/2023 CLINICAL DATA:  Status post fall with left hip pain EXAM: PELVIS - 1 VIEW; LEFT FEMUR PORTABLE 1 VIEW COMPARISON:  None Available. FINDINGS: There is no evidence of acute displaced pelvic fracture or diastasis. Comminuted fracture of the left intertrochanteric femur. No pelvic bone lesions are seen. Degenerative changes of the partially imaged lumbar spine, bilateral  sacroiliac joints, and hips. Vascular calcifications. IMPRESSION: Comminuted left intertrochanteric fracture. No acute displaced pelvic fractures. Electronically Signed   By: Agustin Cree M.D.   On: 04/17/2023 21:56   DG Pelvis 1-2 Views Result Date: 04/17/2023 CLINICAL DATA:  Status post fall with left hip pain EXAM: PELVIS - 1 VIEW; LEFT FEMUR PORTABLE 1 VIEW COMPARISON:  None Available. FINDINGS: There is no evidence of acute displaced pelvic fracture or diastasis. Comminuted fracture of the left intertrochanteric femur. No pelvic bone lesions are seen. Degenerative changes of the partially imaged lumbar spine, bilateral sacroiliac joints, and hips. Vascular calcifications. IMPRESSION: Comminuted left intertrochanteric fracture. No acute displaced pelvic fractures. Electronically Signed   By:  Agustin Cree M.D.   On: 04/17/2023 21:56   DG Knee 1-2 Views Left Result Date: 04/17/2023 CLINICAL DATA:  Fall onto left leg with knee pain EXAM: LEFT KNEE - 2 VIEW COMPARISON:  None Available. FINDINGS: The lateral most aspect of the knee and proximal fibula are not included within the field of view. No acute fracture or dislocation. Small joint effusion. Mild degenerative changes of the knee. IMPRESSION: No acute fracture or dislocation. Small joint effusion. Electronically Signed   By: Agustin Cree M.D.   On: 04/17/2023 21:53    Scheduled Meds:  acetaminophen  1,000 mg Oral TID   apixaban  2.5 mg Oral BID   busPIRone  10 mg Oral QHS   docusate sodium  100 mg Oral BID   famotidine  20 mg Oral BID   feeding supplement  237 mL Oral BID BM   pravastatin  40 mg Oral Daily   sertraline  100 mg Oral Daily   Continuous Infusions:  0.9 % NaCl with KCl 20 mEq / L 75 mL/hr at 04/19/23 0910     LOS: 1 day    Time spent: 39 min  Alwyn Ren, MD 04/19/2023, 12:14 PM

## 2023-04-19 NOTE — Progress Notes (Signed)
Initial Nutrition Assessment   INTERVENTION:   -Ensure Plus High Protein po BID, each supplement provides 350 kcal and 20 grams of protein.   NUTRITION DIAGNOSIS:   Increased nutrient needs related to post-op healing, hip fracture as evidenced by estimated needs.  GOAL:   Patient will meet greater than or equal to 90% of their needs   MONITOR:   PO intake, Supplement acceptance  REASON FOR ASSESSMENT:   Consult Hip fracture protocol  ASSESSMENT:   83 year old female with a history of hypertension stroke in 2023, CAD with a PCI in 2006, SVT she was getting out of her bathtub when she slipped and fell onto her left hip. She was found to have a left intertrochanteric femur fracture.  1/26: s/p left IM nail   Patient in room having echo during time of visit. Will attempt to speak with patient at a later time. Pt currently consuming 100% of meals today.  Have ordered Ensure supplements given increased post-op needs.  Per weight records, weight has increased since November 2024.   Medications: Colace, Pepcid  Labs reviewed.  NUTRITION - FOCUSED PHYSICAL EXAM:  Unable to complete at this time.  Diet Order:   Diet Order             Diet regular Room service appropriate? Yes; Fluid consistency: Thin  Diet effective now                   EDUCATION NEEDS:   Not appropriate for education at this time  Skin:  Skin Assessment: Skin Integrity Issues: Skin Integrity Issues:: Incisions Incisions: left thigh  Last BM:  1/24  Height:   Ht Readings from Last 1 Encounters:  04/18/23 5\' 3"  (1.6 m)    Weight:   Wt Readings from Last 1 Encounters:  04/18/23 55 kg    BMI:  Body mass index is 21.48 kg/m.  Estimated Nutritional Needs:   Kcal:  1400-1600  Protein:  70-80g  Fluid:  1.6L/day  Tilda Franco, MS, RD, LDN Inpatient Clinical Dietitian Contact via Secure chat

## 2023-04-19 NOTE — Progress Notes (Signed)
Subjective: 1 Day Post-Op Procedure(s) (LRB): INTRAMEDULLARY (IM) NAIL INTERTROCHANTERIC (Left) Patient reports pain as mild.    Objective: Vital signs in last 24 hours: Temp:  [97.6 F (36.4 C)-99.7 F (37.6 C)] 98 F (36.7 C) (01/27 0600) Pulse Rate:  [59-84] 84 (01/27 0600) Resp:  [10-18] 16 (01/27 0600) BP: (112-143)/(54-74) 130/74 (01/27 0600) SpO2:  [93 %-100 %] 99 % (01/27 0600)  Intake/Output from previous day: 01/26 0701 - 01/27 0700 In: 3307.3 [P.O.:300; I.V.:2457.3; IV Piggyback:400] Out: 400 [Urine:250; Blood:150] Intake/Output this shift: No intake/output data recorded.  Recent Labs    04/17/23 2130 04/18/23 0333 04/19/23 0258  HGB 12.7 10.3* 8.1*   Recent Labs    04/18/23 0333 04/19/23 0258  WBC 7.4 8.3  RBC 3.40* 2.60*  HCT 31.4* 24.3*  PLT 174 84*   Recent Labs    04/18/23 0333 04/19/23 0258  NA 139 138  K 3.6 3.9  CL 106 107  CO2 24 21*  BUN 29* 26*  CREATININE 1.01* 0.85  GLUCOSE 144* 142*  CALCIUM 9.1 8.5*   No results for input(s): "LABPT", "INR" in the last 72 hours.  Neurovascular intact Incision: dressing C/D/I No cellulitis present Compartment soft   Assessment/Plan: 1 Day Post-Op Procedure(s) (LRB): INTRAMEDULLARY (IM) NAIL INTERTROCHANTERIC (Left) Advance diet Up with therapy   Ollen Gross 04/19/2023, 7:01 AM

## 2023-04-19 NOTE — Evaluation (Signed)
Physical Therapy Evaluation Patient Details Name: Susan Marsh MRN: [id number] DOB: [birth year] Today's Date: 04/19/2023  History of Present Illness  83 y.o. female  who presents with left hip pain after a fall at home. ortho consulted, pt with IT femur fx; now s/p L IM nail on 04/17/22. PMH: hypertension, hyperlipidemia, CAD with PCI in 2006, history of CVA in 2023, hearing loss, and SVT  Clinical Impression  Pt admitted with above diagnosis.  Pt reports I/Mod I at baseline. Requiring +2 mod assist this date for bed mobility and transfers. Patient will benefit from continued  follow up therapy, <3 hours/day at d/c   Pt currently with functional limitations due to the deficits listed below (see PT Problem List). Pt will benefit from acute skilled PT to increase their independence and safety with mobility to allow discharge.           If plan is discharge home, recommend the following: A lot of help with walking and/or transfers;A lot of help with bathing/dressing/bathroom;Assistance with cooking/housework;Help with stairs or ramp for entrance;Assist for transportation   Can travel by private vehicle   No    Equipment Recommendations Other (comment) (defer to SNF)  Recommendations for Other Services       Functional Status Assessment Patient has had a recent decline in their functional status and demonstrates the ability to make significant improvements in function in a reasonable and predictable amount of time.     Precautions / Restrictions Precautions Precautions: Fall Restrictions LLE Weight Bearing Per Provider Order: Partial weight bearing LLE Partial Weight Bearing Percentage or Pounds: 50%      Mobility  Bed Mobility Overal bed mobility: Needs Assistance Bed Mobility: Supine to Sit, Sit to Supine     Supine to sit: +2 for safety/equipment, +2 for physical assistance, Mod assist Sit to supine: +2 for physical assistance, +2 for safety/equipment, Mod assist    General bed mobility comments: multi-modal cues for self assist and sequence. bed pad used to assist lateral scoot; assist to elevate trunk and progress LEs off/on to bed    Transfers Overall transfer level: Needs assistance Equipment used: Rolling walker (2 wheels) Transfers: Sit to/from Stand Sit to Stand: +2 physical assistance, +2 safety/equipment, Mod assist           General transfer comment: pt pulls on RW, assist to rise and steady in standing    Ambulation/Gait Ambulation/Gait assistance: Mod assist, +2 physical assistance, +2 safety/equipment Gait Distance (Feet): 4 Feet Assistive device: Rolling walker (2 wheels) Gait Pattern/deviations: Step-to pattern       General Gait Details: multi-modal cue for sequence, PWB; assist to balance and maneuver RW; pt with bil knees buckling after a few steps so took steps back to bed  Stairs            Wheelchair Mobility     Tilt Bed    Modified Rankin (Stroke Patients Only)       Balance Overall balance assessment: Needs assistance Sitting-balance support: Feet supported, No upper extremity supported Sitting balance-Leahy Scale: Fair     Standing balance support: During functional activity, Reliant on assistive device for balance Standing balance-Leahy Scale: Poor Standing balance comment: reliant on device and external assist                             Pertinent Vitals/Pain Pain Assessment Pain Assessment: Faces Faces Pain Scale: Hurts little more Pain Location: L hip with movement  Pain Descriptors / Indicators: Discomfort, Sore Pain Intervention(s): Limited activity within patient's tolerance, Repositioned, Monitored during session    Home Living Family/patient expects to be discharged to:: Unsure Living Arrangements: Children Available Help at Discharge: Family;Available PRN/intermittently Type of Home: House Home Access: Level entry       Home Layout: One level Home Equipment:  Agricultural consultant (2 wheels) Additional Comments: some info taken from previous admission    Prior Function Prior Level of Function : Independent/Modified Independent             Mobility Comments: pt denies amb with device prior to fall       Extremity/Trunk Assessment   Upper Extremity Assessment Upper Extremity Assessment: Generalized weakness    Lower Extremity Assessment Lower Extremity Assessment: LLE deficits/detail LLE Deficits / Details: AAROM grossly WFL, ankle grossly WFL; hip and knee testing limtied by pain LLE: Unable to fully assess due to pain       Communication   Communication Communication: Hearing impairment  Cognition Arousal: Alert Behavior During Therapy: WFL for tasks assessed/performed Overall Cognitive Status: Within Functional Limits for tasks assessed                                 General Comments: appears WFL, some difficulty, incr time needed d/t [facility]        General Comments      Exercises     Assessment/Plan    PT Assessment Patient needs continued PT services  PT Problem List Decreased strength;Decreased activity tolerance;Decreased balance;Decreased knowledge of use of DME;Pain;Decreased mobility;Decreased knowledge of precautions       PT Treatment Interventions DME instruction;Gait training;Functional mobility training;Therapeutic activities;Therapeutic exercise;Patient/family education    PT Goals (Current goals can be found in the Care Plan section)  Acute Rehab PT Goals PT Goal Formulation: With patient Time For Goal Achievement: 05/03/23 Potential to Achieve Goals: Good    Frequency Min 1X/week     Co-evaluation               AM-PAC PT "6 Clicks" Mobility  Outcome Measure Help needed turning from your back to your side while in a flat bed without using bedrails?: A Little Help needed moving from lying on your back to sitting on the side of a flat bed without using bedrails?: A Lot Help  needed moving to and from a bed to a chair (including a wheelchair)?: Total Help needed standing up from a chair using your arms (e.g., wheelchair or bedside chair)?: Total Help needed to walk in hospital room?: Total Help needed climbing 3-5 steps with a railing? : Total 6 Click Score: 9    End of Session Equipment Utilized During Treatment: Gait belt Activity Tolerance: Patient limited by fatigue;Patient limited by pain Patient left: in bed;with call bell/phone within reach;with bed alarm set   PT Visit Diagnosis: Other abnormalities of gait and mobility (R26.89);History of falling (Z91.81)    Time: 3086-5784 PT Time Calculation (min) (ACUTE ONLY): 12 min   Charges:   PT Evaluation $PT Eval Low Complexity: 1 Low   PT General Charges $$ ACUTE PT VISIT: 1 Visit         Shakur Lembo, PT  Acute Rehab Dept [facility]) [phone]  04/19/2023   [facility] 04/19/2023, 2:41 PM

## 2023-04-20 ENCOUNTER — Encounter (HOSPITAL_COMMUNITY): Payer: Self-pay | Admitting: Orthopedic Surgery

## 2023-04-20 DIAGNOSIS — W19XXXA Unspecified fall, initial encounter: Secondary | ICD-10-CM | POA: Diagnosis not present

## 2023-04-20 DIAGNOSIS — I10 Essential (primary) hypertension: Secondary | ICD-10-CM | POA: Diagnosis not present

## 2023-04-20 LAB — BASIC METABOLIC PANEL
Anion gap: 7 (ref 5–15)
BUN: 23 mg/dL (ref 8–23)
CO2: 27 mmol/L (ref 22–32)
Calcium: 8.1 mg/dL — ABNORMAL LOW (ref 8.9–10.3)
Chloride: 106 mmol/L (ref 98–111)
Creatinine, Ser: 0.74 mg/dL (ref 0.44–1.00)
GFR, Estimated: >60 mL/min (ref >60)
Glucose, Bld: 104 mg/dL — ABNORMAL HIGH (ref 70–99)
Potassium: 3.8 mmol/L (ref 3.5–5.1)
Sodium: 140 mmol/L (ref 135–145)

## 2023-04-20 LAB — CBC
HCT: 19.9 % — ABNORMAL LOW (ref 36.0–46.0)
Hemoglobin: 6.3 g/dL — CL (ref 12.0–15.0)
MCH: 30.3 pg (ref 26.0–34.0)
MCHC: 31.7 g/dL (ref 30.0–36.0)
MCV: 95.7 fL (ref 80.0–100.0)
Platelets: 106 10*3/uL — ABNORMAL LOW (ref 150–400)
RBC: 2.08 MIL/uL — ABNORMAL LOW (ref 3.87–5.11)
RDW: 14.7 % (ref 11.5–15.5)
WBC: 7 10*3/uL (ref 4.0–10.5)
nRBC: 0 % (ref 0.0–0.2)

## 2023-04-20 LAB — HEMOGLOBIN AND HEMATOCRIT, BLOOD
HCT: 27.5 % — ABNORMAL LOW (ref 36.0–46.0)
Hemoglobin: 8.9 g/dL — ABNORMAL LOW (ref 12.0–15.0)

## 2023-04-20 LAB — PREPARE RBC (CROSSMATCH)

## 2023-04-20 MED ORDER — ASPIRIN 81 MG PO CHEW
81.0000 mg | CHEWABLE_TABLET | Freq: Two times a day (BID) | ORAL | Status: DC
  Administered 2023-04-21 – 2023-04-22 (×3): 81 mg via ORAL
  Filled 2023-04-20 (×3): qty 1

## 2023-04-20 MED ORDER — METOPROLOL TARTRATE 50 MG PO TABS
50.0000 mg | ORAL_TABLET | Freq: Two times a day (BID) | ORAL | Status: DC
  Administered 2023-04-20 – 2023-04-22 (×5): 50 mg via ORAL
  Filled 2023-04-20 (×5): qty 1

## 2023-04-20 MED ORDER — SODIUM CHLORIDE 0.9% IV SOLUTION: Freq: Once | INTRAVENOUS | Status: AC

## 2023-04-20 NOTE — Progress Notes (Addendum)
   Subjective: 2 Days Post-Op Procedure(s) (LRB): INTRAMEDULLARY (IM) NAIL INTERTROCHANTERIC (Left) Patient reports pain as mild.   Patient seen in rounds by Dr. Lequita Halt.  Objective: Vital signs in last 24 hours: Temp:  [98.4 F (36.9 C)-99.4 F (37.4 C)] 98.6 F (37 C) (01/28 0807) Pulse Rate:  [87-99] 87 (01/28 0807) Resp:  [15-17] 15 (01/28 0807) BP: (138-165)/(65-74) 165/74 (01/28 0807) SpO2:  [94 %-98 %] 98 % (01/28 0807)  Intake/Output from previous day:  Intake/Output Summary (Last 24 hours) at 04/20/2023 0841 Last data filed at 04/20/2023 0607 Gross per 24 hour  Intake 777.55 ml  Output 400 ml  Net 377.55 ml    Intake/Output this shift: No intake/output data recorded.  Labs: Recent Labs    04/17/23 2130 04/18/23 0333 04/19/23 0258 04/20/23 0323  HGB 12.7 10.3* 8.1* 6.3*   Recent Labs    04/19/23 0258 04/20/23 0323  WBC 8.3 7.0  RBC 2.60* 2.08*  HCT 24.3* 19.9*  PLT 84* 106*   Recent Labs    04/19/23 0258 04/20/23 0323  NA 138 140  K 3.9 3.8  CL 107 106  CO2 21* 27  BUN 26* 23  CREATININE 0.85 0.74  GLUCOSE 142* 104*  CALCIUM 8.5* 8.1*   No results for input(s): "LABPT", "INR" in the last 72 hours.  Exam: General - Patient is Alert and Oriented Extremity - Neurologically intact Neurovascular intact Sensation intact distally Dorsiflexion/Plantar flexion intact Dressing/Incision - clean, dry, no drainage Motor Function - intact, moving foot and toes well on exam.   Past Medical History:  Diagnosis Date   Aortic stenosis    ASCVD (arteriosclerotic cardiovascular disease)    s/p stent   GERD (gastroesophageal reflux disease)    Hearing loss    R>L   High cholesterol    Hypertension    Osteoarthritis     Assessment/Plan: 2 Days Post-Op Procedure(s) (LRB): INTRAMEDULLARY (IM) NAIL INTERTROCHANTERIC (Left) Principal Problem:   Closed fracture of intertrochanteric section of femur (HCC) Active Problems:   History of ischemic  stroke   Hypokalemia   HTN (hypertension)   CAD (coronary artery disease)   Fall  Estimated body mass index is 21.48 kg/m as calculated from the following:   Height as of this encounter: 5\' 3"  (1.6 m).   Weight as of this encounter: 55 kg.   PWB (50%) Hgb 6.3 this AM, 2 units RBCs ordered. Eliquis discontinued due to low platelets Recommend she be on at minimum ASA for DVT prophylaxis if eliquis not an option Addendum: Spoke with Dr. Jerolyn Center, recommended beginning ASA tomorrow morning. Platelets trending up and receiving RBCs today.  Dispo per medical team  Arther Abbott, PA-C Orthopedic Surgery [phone] 04/20/2023, 8:41 AM '

## 2023-04-20 NOTE — TOC Initial Note (Signed)
Transition of Care [facility]) - Initial/Assessment Note    Patient Details  Name: Susan Marsh MRN: [id number] Date of Birth: [birth year]  Transition of Care [facility]) CM/SW Contact:    Amada Jupiter, LCSW Phone Number: 04/20/2023, 12:47 PM  Clinical Narrative:                  Have spoken with pt and son, Johna Sheriff, today to review therapy recommendations for SNF rehab.  They are aware and agreeable with this plan.  Prefer [facility] if possible as pt has completed rehab there before.  Will begin SNF bed search process.   Expected Discharge Plan: Skilled Nursing Facility Barriers to Discharge: SNF Pending bed offer, Insurance Authorization   Patient Goals and CMS Choice Patient states their goals for this hospitalization and ongoing recovery are:: return home following SNF rehab CMS Medicare.gov Compare Post Acute Care list provided to:: Patient Choice offered to / list presented to : Patient, Adult Children [place] ownership interest in [facility].provided to:: Adult Children    Expected Discharge Plan and Services In-house Referral: Clinical Social Work   Post Acute Care Choice: Skilled Nursing Facility Living arrangements for the past 2 months: Single Family Home                 DME Arranged: N/A DME Agency: NA                  Prior Living Arrangements/Services Living arrangements for the past 2 months: Single Family Home Lives with:: Self Patient language and need for interpreter reviewed:: Yes Do you feel safe going back to the place where you live?: Yes      Need for Family Participation in Patient Care: Yes (Comment) Care giver support system in place?: Yes (comment)   Criminal Activity/Legal Involvement Pertinent to Current Situation/Hospitalization: No - Comment as needed  Activities of Daily Living   ADL Screening (condition at time of admission) Independently performs ADLs?: Yes (appropriate for developmental age) Is the patient deaf or have  difficulty hearing?: Yes Does the patient have difficulty seeing, even when wearing glasses/contacts?: No Does the patient have difficulty concentrating, remembering, or making decisions?: No  Permission Sought/Granted Permission sought to share information with : Family Supports Permission granted to share information with : Yes, Verbal Permission Granted  Share Information with NAME: son, [name] @ [phone]  Permission granted to share info w AGENCY: SNFs        Emotional Assessment Appearance:: Appears stated age Attitude/Demeanor/Rapport: Gracious Affect (typically observed): Accepting Orientation: : Oriented to Self, Oriented to Place, Oriented to  Time, Oriented to Situation Alcohol / Substance Use: Not Applicable Psych Involvement: No (comment)  Admission diagnosis:  Closed left hip fracture (HCC) [S72.002A] Fall, initial encounter [W19.XXXA] Patient Active Problem List   Diagnosis Date Noted   CAD (coronary artery disease) 04/18/2023   Fall 04/18/2023   Closed fracture of intertrochanteric section of femur (HCC) 04/17/2023   History of ischemic stroke 03/02/2022   Pyuria 03/02/2022   Hypokalemia 03/02/2022   HTN (hypertension) 03/02/2022   PCP:  Noberto Retort, MD Pharmacy:   [facility] [address], [place] - [address] [facility] [place] [address] Phone: [phone] Fax: [phone]     Social Drivers of Health (SDOH) Social History: SDOH Screenings   Food Insecurity: No Food Insecurity (04/18/2023)  Housing: Low Risk  (04/18/2023)  Transportation Needs: No Transportation Needs (04/18/2023)  Utilities: Not At Risk (04/18/2023)  Social Connections: Socially Isolated (04/18/2023)  Tobacco Use: Unknown (04/18/2023)   SDOH Interventions:     Readmission Risk Interventions    04/20/2023   12:46 PM  Readmission Risk Prevention Plan  Post Dischage Appt Complete  Medication Screening Complete  Transportation  Screening Complete

## 2023-04-20 NOTE — Progress Notes (Signed)
PROGRESS NOTE    Susan Marsh  ZOX:[id number] DOB: 13-Feb-1941 DOA: 04/17/2023 PCP: Susan Retort, MD    Brief Narrative: 83 year old female with a history of hypertension stroke in 2023, CAD with a PCI in 2006, SVT she was getting out of her bathtub when she slipped and fell onto her left hip.  She lives at home alone.no LOC chest pain headache dizziness prior to fall.  She was found to have a left intertrochanteric femur fracture.  Assessment & Plan:   Principal Problem:   Closed fracture of intertrochanteric section of femur (HCC) Active Problems:   History of ischemic stroke   Hypokalemia   HTN (hypertension)   CAD (coronary artery disease)   Fall   #1 left intertrochanteric comminuted femur fracture seen by Ortho.  Status post intramedullary nailing of intertrochanteric fracture of the left hip on 04/18/2023.  Pain control with Tylenol Vitamin D level 36.64 Patient was on Eliquis for DVT prophylaxis this has been held due to low platelets platelets have been recovering hopefully we can start her on some aspirin or Eliquis on 04/21/2023. Per Ortho partial weightbearing Continue PT  #2 hypertension restart metoprolol patient was on metoprolol and Norvasc prior to admission this was on hold due to soft BP however her BP has been trending up we will restart metoprolol.  #3 hypokalemia resolved magnesium 2.0   #4 history of CAD and stroke on statin  Was on beta-blocker prior to admission on hold due to soft BP. Will add Lopressor as needed IV  #5 history of SVT restarting metoprolol  #6 thrombocytopenia platelets 106 from 84 from 174 Eliquis was held  #7  ABLA hemoglobin 6.3 from 10.3 on admission 2 units of packed RBC ordered  Estimated body mass index is 21.48 kg/m as calculated from the following:   Height as of this encounter: 5\' 3"  (1.6 m).   Weight as of this encounter: 55 kg.  DVT prophylaxis: Eliquis code Status: Full code  family Communication: None  at bedside  disposition Plan:  Status is: Inpatient   Consultants:  Ortho  Procedures: None Antimicrobials: None   Subjective: She is awake resting in bed No new complaints Objective: Vitals:   04/19/23 2120 04/20/23 0607 04/20/23 0748 04/20/23 0807  BP: 138/68 (!) 164/70 (!) 155/65 (!) 165/74  Pulse: 99 93 87 87  Resp: 17 17 16 15   Temp: 99.4 F (37.4 C) 99 F (37.2 C) 98.4 F (36.9 C) 98.6 F (37 C)  TempSrc: Oral Oral Oral Oral  SpO2: 94% 97% 98% 98%  Weight:      Height:        Intake/Output Summary (Last 24 hours) at 04/20/2023 1217 Last data filed at 04/20/2023 0900 Gross per 24 hour  Intake 540 ml  Output 200 ml  Net 340 ml   Filed Weights   04/18/23 0121  Weight: 55 kg    Examination:  General exam: Appears in no acute distress  respiratory system: Clear to auscultation. Respiratory effort normal. Cardiovascular system: Regular  gastrointestinal system: Abdomen is nondistended, soft and nontender. No organomegaly or masses felt. Normal bowel sounds heard. Central nervous system: Alert and oriented. No focal neurological deficits. Extremities: Left hip incision covered with dressing clean dry intact Right lower extremity no edema   Data Reviewed: I have personally reviewed following labs and imaging studies  CBC: Recent Labs  Lab 04/17/23 2130 04/18/23 0333 04/19/23 0258 04/20/23 0323  WBC 9.9 7.4 8.3 7.0  HGB 12.7 10.3* 8.1*  6.3*  HCT 37.6 31.4* 24.3* 19.9*  MCV 89.3 92.4 93.5 95.7  PLT 189 174 84* 106*   Basic Metabolic Panel: Recent Labs  Lab 04/17/23 2130 04/18/23 0333 04/19/23 0258 04/20/23 0323  NA 140 139 138 140  K 3.2* 3.6 3.9 3.8  CL 103 106 107 106  CO2 27 24 21* 27  GLUCOSE 124* 144* 142* 104*  BUN 25* 29* 26* 23  CREATININE 1.01* 1.01* 0.85 0.74  CALCIUM 9.5 9.1 8.5* 8.1*  MG  --  2.0  --   --    GFR: Estimated Creatinine Clearance: 44.8 mL/min (by C-G formula based on SCr of 0.74 mg/dL). Liver Function Tests: No  results for input(s): "AST", "ALT", "ALKPHOS", "BILITOT", "PROT", "ALBUMIN" in the last 168 hours. No results for input(s): "LIPASE", "AMYLASE" in the last 168 hours. No results for input(s): "AMMONIA" in the last 168 hours. Coagulation Profile: No results for input(s): "INR", "PROTIME" in the last 168 hours. Cardiac Enzymes: No results for input(s): "CKTOTAL", "CKMB", "CKMBINDEX", "TROPONINI" in the last 168 hours. BNP (last 3 results) No results for input(s): "PROBNP" in the last 8760 hours. HbA1C: No results for input(s): "HGBA1C" in the last 72 hours. CBG: No results for input(s): "GLUCAP" in the last 168 hours. Lipid Profile: No results for input(s): "CHOL", "HDL", "LDLCALC", "TRIG", "CHOLHDL", "LDLDIRECT" in the last 72 hours. Thyroid Function Tests: No results for input(s): "TSH", "T4TOTAL", "FREET4", "T3FREE", "THYROIDAB" in the last 72 hours. Anemia Panel: No results for input(s): "VITAMINB12", "FOLATE", "FERRITIN", "TIBC", "IRON", "RETICCTPCT" in the last 72 hours. Sepsis Labs: No results for input(s): "PROCALCITON", "LATICACIDVEN" in the last 168 hours.  Recent Results (from the past 240 hours)  Surgical pcr screen     Status: None   Collection Time: 04/18/23  1:32 AM   Specimen: Nasal Mucosa; Nasal Swab  Result Value Ref Range Status   MRSA, PCR NEGATIVE NEGATIVE Final   Staphylococcus aureus NEGATIVE NEGATIVE Final    Comment: (NOTE) The Xpert SA Assay (FDA approved for NASAL specimens in patients 69 years of age and older), is one component of a comprehensive surveillance program. It is not intended to diagnose infection nor to guide or monitor treatment. Performed at [facility], [address], [place], [place] [address]          Radiology Studies: ECHOCARDIOGRAM COMPLETE Result Date: 04/19/2023    ECHOCARDIOGRAM REPORT   Patient Name:   Susan Marsh Date of Exam: 04/19/2023 Medical Rec #:  [id number]         Height:       63.0 in  Accession #:    [id number]        Weight:       121.3 lb Date of Birth:  [birth year]        BSA:          1.563 m Patient Age:    82 years          BP:           130/74 mmHg Patient Gender: F                 HR:           77 bpm. Exam Location:  Inpatient Procedure: 2D Echo, Cardiac Doppler and Color Doppler Indications:    Abnormal EKG  History:        Patient has prior history of Echocardiogram examinations, most  recent 03/02/2022. CAD, Stroke; Risk Factors:Hypertension.  Sonographer:    Amy Chionchio Referring Phys: [id number] FRANK ALUISIO IMPRESSIONS  1. Left ventricular ejection fraction, by estimation, is 70 to 75%. The left ventricle has hyperdynamic function . The left ventricle has no regional wall motion abnormalities. 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 Left ventricular diastolic parameters are consistent with Grade I diastolic dysfunction (impaired relaxation). There is a mild intracavitary gradient. Consider evaluation for HCM.  2. Right ventricular systolic function is normal. The right ventricular size is normal.  3. Left atrial size was moderately dilated.  4. The mitral valve is normal in structure. Mild to moderate posteriorly directed mitral valve regurgitation. No evidence of mitral stenosis. No obvious signs of SAM.  5. The aortic valve is normal in structure. Aortic valve regurgitation is mild. No aortic stenosis is present.  6. The inferior vena cava is dilated in size with <50% respiratory variability, suggesting right atrial pressure of 15 mmHg. FINDINGS  Left Ventricle: Left ventricular ejection fraction, by estimation, is 70 to 75%. The left ventricle has hyperdynamic function. The left ventricle has no regional wall motion abnormalities. The left ventricular internal cavity size was small. 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 Left ventricular diastolic parameters are consistent with  Grade I diastolic dysfunction (impaired relaxation). Right Ventricle: The right ventricular size is normal. No increase in right ventricular wall thickness. Right ventricular systolic function is normal. Left Atrium: Left atrial size was moderately dilated. Right Atrium: Right atrial size was normal in size. Pericardium: There is no evidence of pericardial effusion. Mitral Valve: The mitral valve is normal in structure. Mild mitral valve regurgitation. No evidence of mitral valve stenosis. MV peak gradient, 7.8 mmHg. The mean mitral valve gradient is 3.0 mmHg. Tricuspid Valve: The tricuspid valve is normal in structure. Tricuspid valve regurgitation is trivial. No evidence of tricuspid stenosis. Aortic Valve: The aortic valve is normal in structure. Aortic valve regurgitation is mild. Aortic regurgitation PHT measures 671 msec. No aortic stenosis is present. Aortic valve mean gradient measures 8.0 mmHg. Aortic valve peak gradient measures 13.2 mmHg. Pulmonic Valve: The pulmonic valve was normal in structure. Pulmonic valve regurgitation is not visualized. No evidence of pulmonic stenosis. Aorta: The aortic root is normal in size and structure. Venous: The inferior vena cava is dilated in size with less than 50% respiratory variability, suggesting right atrial pressure of 15 mmHg. IAS/Shunts: No atrial level shunt detected by color flow Doppler.  LEFT VENTRICLE PLAX 2D LVIDd:         3.10 cm     Diastology LVIDs:         2.20 cm     LV e' medial:    4.97 cm/s LV PW:         1.10 cm     LV E/e' medial:  15.3 LV IVS:        1.50 cm     LV e' lateral:   6.34 cm/s LVOT diam:     2.10 cm     LV E/e' lateral: 12.0 LVOT Area:     3.46 cm  LV Volumes (MOD) LV vol d, MOD A2C: 54.8 ml LV vol d, MOD A4C: 61.8 ml LV vol s, MOD A2C: 14.0 ml LV vol s, MOD A4C: 14.7 ml LV SV MOD A2C:     40.8 ml LV SV MOD A4C:     61.8 ml LV SV MOD BP:  44.8 ml RIGHT VENTRICLE          IVC RV Basal diam:  2.90 cm  IVC diam: 2.80 cm TAPSE  (M-mode): 2.3 cm LEFT ATRIUM             Index        RIGHT ATRIUM           Index LA Vol (A2C):   62.7 ml 40.11 ml/m  RA Area:     12.40 cm LA Vol (A4C):   72.3 ml 46.25 ml/m  RA Volume:   24.60 ml  15.74 ml/m LA Biplane Vol: 72.8 ml 46.57 ml/m  AORTIC VALVE               PULMONIC VALVE AV Vmax:      182.00 cm/s  PV Vmax:       1.07 m/s AV Vmean:     135.000 cm/s PV Peak grad:  4.6 mmHg AV VTI:       0.347 m AV Peak Grad: 13.2 mmHg AV Mean Grad: 8.0 mmHg AI PHT:       671 msec  AORTA Ao Root diam: 2.60 cm Ao Asc diam:  2.90 cm MITRAL VALVE                TRICUSPID VALVE MV Area (PHT): 2.71 cm     TR Peak grad:   30.9 mmHg MV Peak grad:  7.8 mmHg     TR Vmax:        278.00 cm/s MV Mean grad:  3.0 mmHg MV Vmax:       1.40 m/s     SHUNTS MV Vmean:      85.6 cm/s    Systemic Diam: 2.10 cm MV Decel Time: 280 msec MV E velocity: 76.20 cm/s MV A velocity: 123.00 cm/s MV E/A ratio:  0.62 Aditya Sabharwal Electronically signed by Dorthula Nettles Signature Date/Time: 04/19/2023/1:27:21 PM    Final    DG FEMUR MIN 2 VIEWS LEFT Result Date: 04/18/2023 CLINICAL DATA:  Elective surgery. EXAM: LEFT FEMUR 2 VIEWS COMPARISON:  Preoperative imaging FINDINGS: Five fluoroscopic spot views of the left femur obtained in the operating room. Placement of femoral intramedullary nail with trans trochanteric and distal locking screw fixation traversing proximal femur fracture. Fluoroscopy time 29 seconds. Dose 6.2 mGy. IMPRESSION: Procedural fluoroscopy during femur fracture fixation. Electronically Signed   By: Narda Rutherford M.D.   On: 04/18/2023 14:45   DG C-Arm 1-60 Min-No Report Result Date: 04/18/2023 Fluoroscopy was utilized by the requesting physician.  No radiographic interpretation.    Scheduled Meds:  acetaminophen  1,000 mg Oral TID   [START ON 04/21/2023] aspirin  81 mg Oral BID   busPIRone  10 mg Oral QHS   docusate sodium  100 mg Oral BID   famotidine  20 mg Oral BID   feeding supplement  237 mL Oral BID  BM   pravastatin  40 mg Oral Daily   sertraline  100 mg Oral Daily   Continuous Infusions:     LOS: 2 days    Time spent: 39 min  Alwyn Ren, MD 04/20/2023, 12:17 PM

## 2023-04-20 NOTE — Progress Notes (Signed)
Date and time results received: 04/20/23 05:00   Test: Hgb Critical Value: 6.3  Name of Provider Notified: Johann Capers, NP  Orders Received? Or Actions Taken?: Orders Received - See Orders for details

## 2023-04-20 NOTE — NC FL2 (Signed)
[place] MEDICAID FL2 LEVEL OF CARE FORM     IDENTIFICATION  Patient Name: Susan Marsh Birthdate: 10-29-1940 Sex: female Admission Date (Current Location): 04/17/2023  [facility] and [place] Number:  Producer, television/film/video and Address:  [facility],  [address], [place] [address]      Provider Number: [id number]  Attending Physician Name and Address:  Alwyn Ren, MD  Relative Name and Phone Number:  son, [name] @ [phone]    Current Level of Care: Hospital Recommended Level of Care: Skilled Nursing Facility Prior Approval Number:    Date Approved/Denied:   PASRR Number: [id number]  Discharge Plan: SNF    Current Diagnoses: Patient Active Problem List   Diagnosis Date Noted   CAD (coronary artery disease) 04/18/2023   Fall 04/18/2023   Closed fracture of intertrochanteric section of femur (HCC) 04/17/2023   History of ischemic stroke 03/02/2022   Pyuria 03/02/2022   Hypokalemia 03/02/2022   HTN (hypertension) 03/02/2022    Orientation RESPIRATION BLADDER Height & Weight     Self, Time, Situation, Place  Normal Continent, External catheter (currently wtih purewick) Weight: 121 lb 4.1 oz (55 kg) Height:  5\' 3"  (160 cm)  BEHAVIORAL SYMPTOMS/MOOD NEUROLOGICAL BOWEL NUTRITION STATUS      Continent Diet  AMBULATORY STATUS COMMUNICATION OF NEEDS Skin   Limited Assist Verbally Other (Comment) (surgical incision only)                       Personal Care Assistance Level of Assistance  Bathing, Dressing Bathing Assistance: Limited assistance   Dressing Assistance: Limited assistance     Functional Limitations Info  Sight, Hearing, Speech Sight Info: Adequate Hearing Info: Impaired Speech Info: Adequate    SPECIAL CARE FACTORS FREQUENCY  PT (By licensed PT), OT (By licensed OT)     PT Frequency: 5x/wk OT Frequency: 5x/wk            Contractures Contractures Info: Not present    Additional Factors Info   Code Status, Allergies, Psychotropic Code Status Info: Full Allergies Info: Alendronate Sodium Psychotropic Info: see MAR         Current Medications (04/20/2023):  This is the current hospital active medication list Current Facility-Administered Medications  Medication Dose Route Frequency Provider Last Rate Last Admin   acetaminophen (TYLENOL) tablet 1,000 mg  1,000 mg Oral TID Ollen Gross, MD   1,000 mg at 04/20/23 0944   acetaminophen (TYLENOL) tablet 500 mg  500 mg Oral Q6H PRN Ollen Gross, MD       Melene Muller ON 04/21/2023] aspirin chewable tablet 81 mg  81 mg Oral BID Edmisten, Kristie L, PA       busPIRone (BUSPAR) tablet 10 mg  10 mg Oral QHS Aluisio, Homero Fellers, MD   10 mg at 04/19/23 2120   docusate sodium (COLACE) capsule 100 mg  100 mg Oral BID Ollen Gross, MD   100 mg at 04/20/23 0944   famotidine (PEPCID) tablet 20 mg  20 mg Oral BID Ollen Gross, MD   20 mg at 04/20/23 0944   feeding supplement (ENSURE ENLIVE / ENSURE PLUS) liquid 237 mL  237 mL Oral BID BM Alwyn Ren, MD   237 mL at 04/20/23 1213   menthol-cetylpyridinium (CEPACOL) lozenge 3 mg  1 lozenge Oral PRN Ollen Gross, MD       Or   phenol (CHLORASEPTIC) mouth spray 1 spray  1 spray Mouth/Throat PRN Ollen Gross, MD  methocarbamol (ROBAXIN) tablet 500 mg  500 mg Oral Q6H PRN Ollen Gross, MD       Or   methocarbamol (ROBAXIN) injection 500 mg  500 mg Intravenous Q6H PRN Aluisio, Homero Fellers, MD       metoCLOPramide (REGLAN) tablet 5-10 mg  5-10 mg Oral Q8H PRN Ollen Gross, MD       Or   metoCLOPramide (REGLAN) injection 5-10 mg  5-10 mg Intravenous Q8H PRN Aluisio, Homero Fellers, MD       metoprolol tartrate (LOPRESSOR) tablet 50 mg  50 mg Oral BID Alwyn Ren, MD       ondansetron [facility]) tablet 4 mg  4 mg Oral Q6H PRN Ollen Gross, MD       Or   ondansetron [facility]) injection 4 mg  4 mg Intravenous Q6H PRN Aluisio, Homero Fellers, MD       oxyCODONE (Oxy IR/ROXICODONE) immediate release tablet  2.5-5 mg  2.5-5 mg Oral Q4H PRN Aluisio, Homero Fellers, MD       polyethylene glycol (MIRALAX / GLYCOLAX) packet 17 g  17 g Oral Daily PRN Aluisio, Homero Fellers, MD       pravastatin (PRAVACHOL) tablet 40 mg  40 mg Oral Daily Ollen Gross, MD   40 mg at 04/20/23 0944   sertraline (ZOLOFT) tablet 100 mg  100 mg Oral Daily Ollen Gross, MD   100 mg at 04/20/23 0945     Discharge Medications: Please see discharge summary for a list of discharge medications.  Relevant Imaging Results:  Relevant Lab Results:   Additional Information SS#: [id number]  Amada Jupiter, LCSW

## 2023-04-21 DIAGNOSIS — S72141A Displaced intertrochanteric fracture of right femur, initial encounter for closed fracture: Secondary | ICD-10-CM | POA: Diagnosis not present

## 2023-04-21 LAB — BASIC METABOLIC PANEL
Anion gap: 7 (ref 5–15)
BUN: 21 mg/dL (ref 8–23)
CO2: 28 mmol/L (ref 22–32)
Calcium: 9 mg/dL (ref 8.9–10.3)
Chloride: 106 mmol/L (ref 98–111)
Creatinine, Ser: 0.65 mg/dL (ref 0.44–1.00)
GFR, Estimated: >60 mL/min (ref >60)
Glucose, Bld: 101 mg/dL — ABNORMAL HIGH (ref 70–99)
Potassium: 4.2 mmol/L (ref 3.5–5.1)
Sodium: 141 mmol/L (ref 135–145)

## 2023-04-21 LAB — CBC
HCT: 27.8 % — ABNORMAL LOW (ref 36.0–46.0)
Hemoglobin: 9 g/dL — ABNORMAL LOW (ref 12.0–15.0)
MCH: 31 pg (ref 26.0–34.0)
MCHC: 32.4 g/dL (ref 30.0–36.0)
MCV: 95.9 fL (ref 80.0–100.0)
Platelets: 125 10*3/uL — ABNORMAL LOW (ref 150–400)
RBC: 2.9 MIL/uL — ABNORMAL LOW (ref 3.87–5.11)
RDW: 14.6 % (ref 11.5–15.5)
WBC: 7.5 10*3/uL (ref 4.0–10.5)
nRBC: 0.3 % — ABNORMAL HIGH (ref 0.0–0.2)

## 2023-04-21 MED ORDER — AMLODIPINE BESYLATE 5 MG PO TABS
5.0000 mg | ORAL_TABLET | Freq: Every day | ORAL | Status: DC
  Administered 2023-04-21 – 2023-04-22 (×2): 5 mg via ORAL
  Filled 2023-04-21 (×2): qty 1

## 2023-04-21 MED ORDER — APIXABAN 2.5 MG PO TABS
2.5000 mg | ORAL_TABLET | Freq: Two times a day (BID) | ORAL | Status: DC
  Administered 2023-04-21 – 2023-04-22 (×3): 2.5 mg via ORAL
  Filled 2023-04-21 (×3): qty 1

## 2023-04-21 NOTE — Progress Notes (Signed)
PHARMACY NOTE -  Eliquis  Pharmacy has been assisting with dosing of Eliquis for DVT ppx. Dosage remains stable at 2.5 mg bid and further renal adjustments per institutional Pharmacy antibiotic protocol Patient also on ASA 81 mg daily for Hx CVA/CAD; confirmed with MD OK to continue both.  Pharmacy will sign off, following peripherally for dose adjustments or CBC changes. Please reconsult if a change in clinical status warrants re-evaluation of dosage.  Bernadene Person, PharmD, BCPS [phone] 04/21/2023, 11:46 AM

## 2023-04-21 NOTE — Progress Notes (Signed)
   Subjective: 3 Days Post-Op Procedure(s) (LRB): INTRAMEDULLARY (IM) NAIL INTERTROCHANTERIC (Left) Patient seen in rounds by Dr. Lequita Halt. Patient reports pain as mild. Denies SOB or chest pain. Denies dizziness or lightheadedness.  Objective: Vital signs in last 24 hours: Temp:  [98.6 F (37 C)-98.9 F (37.2 C)] 98.9 F (37.2 C) (01/29 0981) Pulse Rate:  [80-97] 80 (01/29 0613) Resp:  [16-17] 17 (01/29 0613) BP: (140-167)/(68-75) 140/69 (01/29 0613) SpO2:  [95 %-99 %] 95 % (01/29 0613)  Intake/Output from previous day:  Intake/Output Summary (Last 24 hours) at 04/21/2023 0811 Last data filed at 04/21/2023 0725 Gross per 24 hour  Intake 748 ml  Output 1550 ml  Net -802 ml    Intake/Output this shift: No intake/output data recorded.  Labs: Recent Labs    04/19/23 0258 04/20/23 0323 04/20/23 1200 04/21/23 0312  HGB 8.1* 6.3* 8.9* 9.0*   Recent Labs    04/20/23 0323 04/20/23 1200 04/21/23 0312  WBC 7.0  --  7.5  RBC 2.08*  --  2.90*  HCT 19.9* 27.5* 27.8*  PLT 106*  --  125*   Recent Labs    04/20/23 0323 04/21/23 0312  NA 140 141  K 3.8 4.2  CL 106 106  CO2 27 28  BUN 23 21  CREATININE 0.74 0.65  GLUCOSE 104* 101*  CALCIUM 8.1* 9.0   No results for input(s): "LABPT", "INR" in the last 72 hours.  Exam: General - Patient is Alert and Oriented Extremity - Neurologically intact Neurovascular intact Sensation intact distally Dorsiflexion/Plantar flexion intact Dressing/Incision - clean, dry, no drainage Motor Function - intact, moving foot and toes well on exam.  Past Medical History:  Diagnosis Date   Aortic stenosis    ASCVD (arteriosclerotic cardiovascular disease)    s/p stent   GERD (gastroesophageal reflux disease)    Hearing loss    R>L   High cholesterol    Hypertension    Osteoarthritis     Assessment/Plan: 3 Days Post-Op Procedure(s) (LRB): INTRAMEDULLARY (IM) NAIL INTERTROCHANTERIC (Left) Principal Problem:   Closed fracture  of intertrochanteric section of femur (HCC) Active Problems:   History of ischemic stroke   Hypokalemia   HTN (hypertension)   CAD (coronary artery disease)   Fall  Estimated body mass index is 21.48 kg/m as calculated from the following:   Height as of this encounter: 5\' 3"  (1.6 m).   Weight as of this encounter: 55 kg.  DVT Prophylaxis - recommend discharge with aspirin  Patient received 2 units PRBCs yesterday. Hgb improved to 9.0.  Continue physical therapy while admitted. PWB (50%). Discharge to SNF once cleared by hospitalists and bed found.  Alfonzo Feller, PA-C Orthopedic Surgery 04/21/2023, 8:11 AM

## 2023-04-21 NOTE — TOC Progression Note (Signed)
Transition of Care [facility]) - Progression Note    Patient Details  Name: Susan Marsh MRN: [id number] Date of Birth: May 04, 1940  [facility] CM/SW Contact  Amada Jupiter, LCSW Phone Number: 04/21/2023, 3:12 PM  Clinical Narrative:     Pt/ son have accepted SNF bed offer with [facility] and MD anticipates she will be medically ready for dc tomorrow.  Have secured insurance authorization as well Berkley Harvey # [id number]).  Continue to follow.   Expected Discharge Plan: Skilled Nursing Facility Barriers to Discharge: SNF Pending bed offer, Insurance Authorization  Expected Discharge Plan and Services In-house Referral: Clinical Social Work   Post Acute Care Choice: Skilled Nursing Facility Living arrangements for the past 2 months: Single Family Home                 DME Arranged: N/A DME Agency: NA                   Social Determinants of Health (SDOH) Interventions SDOH Screenings   Food Insecurity: No Food Insecurity (04/18/2023)  Housing: Low Risk  (04/18/2023)  Transportation Needs: No Transportation Needs (04/18/2023)  Utilities: Not At Risk (04/18/2023)  Social Connections: Socially Isolated (04/18/2023)  Tobacco Use: Unknown (04/18/2023)    Readmission Risk Interventions    04/20/2023   12:46 PM  Readmission Risk Prevention Plan  Post Dischage Appt Complete  Medication Screening Complete  Transportation Screening Complete

## 2023-04-21 NOTE — Progress Notes (Signed)
Physical Therapy Treatment Patient Details Name: Susan Marsh MRN: [id number] DOB: 1940/06/20 Today's Date: 04/21/2023   History of Present Illness 83 y.o. female  who presents with left hip pain after a fall at home. ortho consulted, pt with IT femur fx; now s/p L IM nail on 04/17/22. PMH: hypertension, hyperlipidemia, CAD with PCI in 2006, history of CVA in 2023, hearing loss, and SVT    PT Comments  Pt progressing, cooperative with PT. Amb a few more steps than last session however remains limited by pain. Reviewed LE therapeutic exercises after amb and pt with no incr pain. D/c plan remains appropriate. Continue PT in acute setting    If plan is discharge home, recommend the following: A lot of help with walking and/or transfers;A lot of help with bathing/dressing/bathroom;Assistance with cooking/housework;Help with stairs or ramp for entrance;Assist for transportation   Can travel by private vehicle     No  Equipment Recommendations  None recommended by PT    Recommendations for Other Services       Precautions / Restrictions Precautions Precautions: Fall Restrictions LLE Weight Bearing Per Provider Order: Partial weight bearing LLE Partial Weight Bearing Percentage or Pounds: 50     Mobility  Bed Mobility Overal bed mobility: Needs Assistance Bed Mobility: Supine to Sit, Sit to Supine     Supine to sit: +2 for safety/equipment, +2 for physical assistance, Mod assist Sit to supine: +2 for physical assistance, +2 for safety/equipment, Mod assist   General bed mobility comments: multi-modal cues for self assist and sequence. bed pad used to assist lateral scoot; assist to elevate trunk and progress LEs off bed, pt effort improving    Transfers Overall transfer level: Needs assistance Equipment used: Rolling walker (2 wheels) Transfers: Sit to/from Stand Sit to Stand: +2 physical assistance, +2 safety/equipment, Mod assist           General transfer comment:  pt pulls on RW, assist to rise and steady in standing    Ambulation/Gait Ambulation/Gait assistance: Mod assist, +2 physical assistance, +2 safety/equipment, Max assist Gait Distance (Feet): 6 Feet Assistive device: Rolling walker (2 wheels) Gait Pattern/deviations: Step-to pattern       General Gait Details: multi-modal cue for sequence, PWB; assist to balance and maneuver RW; pt with bil knees buckling after ~ 4', chair brought to pt   Stairs             Wheelchair Mobility     Tilt Bed    Modified Rankin (Stroke Patients Only)       Balance     Sitting balance-Leahy Scale: Fair     Standing balance support: During functional activity, Reliant on assistive device for balance Standing balance-Leahy Scale: Poor Standing balance comment: reliant on device and external assist                            Cognition Arousal: Alert Behavior During Therapy: WFL for tasks assessed/performed Overall Cognitive Status: Within Functional Limits for tasks assessed                                 General Comments: appears WFL, some difficulty, incr time needed d/t [facility]        Exercises General Exercises - Lower Extremity Ankle Circles/Pumps: AROM, Both, 10 reps Long Arc Quad: AROM, Both, 5 reps Heel Slides: AAROM, Left, 10 reps  General Comments        Pertinent Vitals/Pain Pain Assessment Pain Assessment: Faces Faces Pain Scale: Hurts even more Pain Location: L hip with movement Pain Descriptors / Indicators: Discomfort, Sore, Grimacing Pain Intervention(s): Limited activity within patient's tolerance, Monitored during session, Premedicated before session, Repositioned    Home Living                          Prior Function            PT Goals (current goals can now be found in the care plan section) Acute Rehab PT Goals PT Goal Formulation: With patient Time For Goal Achievement: 05/03/23 Potential to Achieve  Goals: Good Progress towards PT goals: Progressing toward goals    Frequency    Min 1X/week      PT Plan      Co-evaluation              AM-PAC PT "6 Clicks" Mobility   Outcome Measure  Help needed turning from your back to your side while in a flat bed without using bedrails?: A Little Help needed moving from lying on your back to sitting on the side of a flat bed without using bedrails?: A Lot Help needed moving to and from a bed to a chair (including a wheelchair)?: Total Help needed standing up from a chair using your arms (e.g., wheelchair or bedside chair)?: Total Help needed to walk in hospital room?: Total Help needed climbing 3-5 steps with a railing? : Total 6 Click Score: 9    End of Session Equipment Utilized During Treatment: Gait belt Activity Tolerance: Patient limited by pain Patient left: in chair;with call bell/phone within reach;with chair alarm set   PT Visit Diagnosis: Other abnormalities of gait and mobility (R26.89);History of falling (Z91.81)     Time: 3329-5188 PT Time Calculation (min) (ACUTE ONLY): 19 min  Charges:    $Gait Training: 8-22 mins PT General Charges $$ ACUTE PT VISIT: 1 Visit                     Sheray Grist, PT  Acute Rehab Dept ([facility]) [phone]  04/21/2023    [facility] 04/21/2023, 3:24 PM

## 2023-04-21 NOTE — Progress Notes (Signed)
[facility] PROGRESS NOTE    Progress Note  DAREN DOSWELL  ZOX:[id number] DOB: 1940/06/12 DOA: 04/17/2023 PCP: Noberto Retort, MD     Brief Narrative:   Susan Marsh is an 83 y.o. female past medical history of essential hypertension, CVA in 2023, CAD status post PCI in 2006, also history of SVT, she relates she was taking a bath and slipped and felt into her left hip denies any loss of consciousness, came into the ED was found to have a left intertrochanteric comminuted femur fracture    Assessment/Plan:   Left closed fracture of intertrochanteric comminuted femur : Status post ORIF on 04/18/2023. Tylenol seems to be working for pain control. Patient was started on DVT prophylaxis but was held due to thrombocytopenia. Platelets are slowly trending up. PT was consulted who recommended skilled nursing facility. Weightbearing per orthopedics.  Essential hypertension: Blood pressure is trending up restart metoprolol and Norvasc at a lower dose.  Hypokalemia/hypomagnesemia: Repleted now resolved.  History of CAD/CVA: Continue statins and beta-blockers.  History of SVT: Continue metoprolol.  Thrombocytopenia: Platelets drop as low as 84 Eliquis was held now this morning 120s will resume Eliquis.  Acute blood loss anemia Hemoglobin on admission dropped to 6.3 transfused 2 units packed red blood cells. Now this morning is 9.0.   DVT prophylaxis: ELIQUIS Family Communication:NONE Status is: Inpatient Remains inpatient appropriate because: Hopefully skilled facility tomorrow    Code Status:     Code Status Orders  (From admission, onward)           Start     Ordered   04/18/23 0309  Full code  Continuous       Question:  By:  Answer:  Consent: discussion documented in EHR   04/18/23 0310           Code Status History     Date Active Date Inactive Code Status Order ID Comments User Context   03/02/2022 2045 03/07/2022 0413 Full Code  [id number]  Hillary Bow, DO Inpatient   01/23/2022 0710 01/23/2022 2121 Full Code [id number]  Glade Lloyd, MD Inpatient         IV Access:   Peripheral IV   Procedures and diagnostic studies:   ECHOCARDIOGRAM COMPLETE Result Date: 04/19/2023    ECHOCARDIOGRAM REPORT   Patient Name:   Susan Marsh Date of Exam: 04/19/2023 Medical Rec #:  [id number]         Height:       63.0 in Accession #:    [id number]        Weight:       121.3 lb Date of Birth:  12/21/1940        BSA:          1.563 m Patient Age:    82 years          BP:           130/74 mmHg Patient Gender: F                 HR:           77 bpm. Exam Location:  Inpatient Procedure: 2D Echo, Cardiac Doppler and Color Doppler Indications:    Abnormal EKG  History:        Patient has prior history of Echocardiogram examinations, most                 recent 03/02/2022. CAD, Stroke; Risk Factors:Hypertension.  Sonographer:    Amy Chionchio  Referring Phys: [id number] FRANK ALUISIO IMPRESSIONS  1. Left ventricular ejection fraction, by estimation, is 70 to 75%. The left ventricle has hyperdynamic function . The left ventricle has no regional wall motion abnormalities. 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 Left ventricular diastolic parameters are consistent with Grade I diastolic dysfunction (impaired relaxation). There is a mild intracavitary gradient. Consider evaluation for HCM.  2. Right ventricular systolic function is normal. The right ventricular size is normal.  3. Left atrial size was moderately dilated.  4. The mitral valve is normal in structure. Mild to moderate posteriorly directed mitral valve regurgitation. No evidence of mitral stenosis. No obvious signs of SAM.  5. The aortic valve is normal in structure. Aortic valve regurgitation is mild. No aortic stenosis is present.  6. The inferior vena cava is dilated in size with <50% respiratory variability, suggesting right atrial pressure of  15 mmHg. FINDINGS  Left Ventricle: Left ventricular ejection fraction, by estimation, is 70 to 75%. The left ventricle has hyperdynamic function. The left ventricle has no regional wall motion abnormalities. The left ventricular internal cavity size was small. 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 Left ventricular diastolic parameters are consistent with Grade I diastolic dysfunction (impaired relaxation). Right Ventricle: The right ventricular size is normal. No increase in right ventricular wall thickness. Right ventricular systolic function is normal. Left Atrium: Left atrial size was moderately dilated. Right Atrium: Right atrial size was normal in size. Pericardium: There is no evidence of pericardial effusion. Mitral Valve: The mitral valve is normal in structure. Mild mitral valve regurgitation. No evidence of mitral valve stenosis. MV peak gradient, 7.8 mmHg. The mean mitral valve gradient is 3.0 mmHg. Tricuspid Valve: The tricuspid valve is normal in structure. Tricuspid valve regurgitation is trivial. No evidence of tricuspid stenosis. Aortic Valve: The aortic valve is normal in structure. Aortic valve regurgitation is mild. Aortic regurgitation PHT measures 671 msec. No aortic stenosis is present. Aortic valve mean gradient measures 8.0 mmHg. Aortic valve peak gradient measures 13.2 mmHg. Pulmonic Valve: The pulmonic valve was normal in structure. Pulmonic valve regurgitation is not visualized. No evidence of pulmonic stenosis. Aorta: The aortic root is normal in size and structure. Venous: The inferior vena cava is dilated in size with less than 50% respiratory variability, suggesting right atrial pressure of 15 mmHg. IAS/Shunts: No atrial level shunt detected by color flow Doppler.  LEFT VENTRICLE PLAX 2D LVIDd:         3.10 cm     Diastology LVIDs:         2.20 cm     LV e' medial:    4.97 cm/s LV PW:         1.10 cm     LV E/e' medial:  15.3 LV IVS:         1.50 cm     LV e' lateral:   6.34 cm/s LVOT diam:     2.10 cm     LV E/e' lateral: 12.0 LVOT Area:     3.46 cm  LV Volumes (MOD) LV vol d, MOD A2C: 54.8 ml LV vol d, MOD A4C: 61.8 ml LV vol s, MOD A2C: 14.0 ml LV vol s, MOD A4C: 14.7 ml LV SV MOD A2C:     40.8 ml LV SV MOD A4C:     61.8 ml LV SV MOD BP:      44.8 ml RIGHT VENTRICLE  IVC RV Basal diam:  2.90 cm  IVC diam: 2.80 cm TAPSE (M-mode): 2.3 cm LEFT ATRIUM             Index        RIGHT ATRIUM           Index LA Vol (A2C):   62.7 ml 40.11 ml/m  RA Area:     12.40 cm LA Vol (A4C):   72.3 ml 46.25 ml/m  RA Volume:   24.60 ml  15.74 ml/m LA Biplane Vol: 72.8 ml 46.57 ml/m  AORTIC VALVE               PULMONIC VALVE AV Vmax:      182.00 cm/s  PV Vmax:       1.07 m/s AV Vmean:     135.000 cm/s PV Peak grad:  4.6 mmHg AV VTI:       0.347 m AV Peak Grad: 13.2 mmHg AV Mean Grad: 8.0 mmHg AI PHT:       671 msec  AORTA Ao Root diam: 2.60 cm Ao Asc diam:  2.90 cm MITRAL VALVE                TRICUSPID VALVE MV Area (PHT): 2.71 cm     TR Peak grad:   30.9 mmHg MV Peak grad:  7.8 mmHg     TR Vmax:        278.00 cm/s MV Mean grad:  3.0 mmHg MV Vmax:       1.40 m/s     SHUNTS MV Vmean:      85.6 cm/s    Systemic Diam: 2.10 cm MV Decel Time: 280 msec MV E velocity: 76.20 cm/s MV A velocity: 123.00 cm/s MV E/A ratio:  0.62 Aditya Sabharwal Electronically signed by Dorthula Nettles Signature Date/Time: 04/19/2023/1:27:21 PM    Final      Medical Consultants:   None.   Subjective:    Zenaida L Rockers has no new complaints this morning.  Objective:    Vitals:   04/20/23 1359 04/20/23 2134 04/21/23 0613 04/21/23 0819  BP:  (!) 162/75 (!) 140/69 (!) 168/76  Pulse: 97 92 80 73  Resp:  16 17   Temp:  98.7 F (37.1 C) 98.9 F (37.2 C)   TempSrc:  Oral Oral   SpO2:  97% 95% 97%  Weight:      Height:       SpO2: 97 % O2 Flow Rate (L/min): 0 L/min   Intake/Output Summary (Last 24 hours) at 04/21/2023 1009 Last data filed at 04/21/2023  1005 Gross per 24 hour  Intake 868 ml  Output 1750 ml  Net -882 ml   Filed Weights   04/18/23 0121  Weight: 55 kg    Exam: General exam: In no acute distress. Respiratory system: Good air movement and clear to auscultation. Cardiovascular system: S1 & S2 heard, RRR. No JVD. Gastrointestinal system: Abdomen is nondistended, soft and nontender.  Extremities: No pedal edema. Skin: No rashes, lesions or ulcers Psychiatry: Judgement and insight appear normal. Mood & affect appropriate.    Data Reviewed:    Labs: Basic Metabolic Panel: Recent Labs  Lab 04/17/23 2130 04/18/23 0333 04/19/23 0258 04/20/23 0323 04/21/23 0312  NA 140 139 138 140 141  K 3.2* 3.6 3.9 3.8 4.2  CL 103 106 107 106 106  CO2 27 24 21* 27 28  GLUCOSE 124* 144* 142* 104* 101*  BUN 25* 29* 26* 23 21  CREATININE  1.01* 1.01* 0.85 0.74 0.65  CALCIUM 9.5 9.1 8.5* 8.1* 9.0  MG  --  2.0  --   --   --    GFR Estimated Creatinine Clearance: 44.8 mL/min (by C-G formula based on SCr of 0.65 mg/dL). Liver Function Tests: No results for input(s): "AST", "ALT", "ALKPHOS", "BILITOT", "PROT", "ALBUMIN" in the last 168 hours. No results for input(s): "LIPASE", "AMYLASE" in the last 168 hours. No results for input(s): "AMMONIA" in the last 168 hours. Coagulation profile No results for input(s): "INR", "PROTIME" in the last 168 hours. COVID-19 Labs  No results for input(s): "DDIMER", "FERRITIN", "LDH", "CRP" in the last 72 hours.  No results found for: "SARSCOV2NAA"  CBC: Recent Labs  Lab 04/17/23 2130 04/18/23 0333 04/19/23 0258 04/20/23 0323 04/20/23 1200 04/21/23 0312  WBC 9.9 7.4 8.3 7.0  --  7.5  HGB 12.7 10.3* 8.1* 6.3* 8.9* 9.0*  HCT 37.6 31.4* 24.3* 19.9* 27.5* 27.8*  MCV 89.3 92.4 93.5 95.7  --  95.9  PLT 189 174 84* 106*  --  125*   Cardiac Enzymes: No results for input(s): "CKTOTAL", "CKMB", "CKMBINDEX", "TROPONINI" in the last 168 hours. BNP (last 3 results) No results for input(s):  "PROBNP" in the last 8760 hours. CBG: No results for input(s): "GLUCAP" in the last 168 hours. D-Dimer: No results for input(s): "DDIMER" in the last 72 hours. Hgb A1c: No results for input(s): "HGBA1C" in the last 72 hours. Lipid Profile: No results for input(s): "CHOL", "HDL", "LDLCALC", "TRIG", "CHOLHDL", "LDLDIRECT" in the last 72 hours. Thyroid function studies: No results for input(s): "TSH", "T4TOTAL", "T3FREE", "THYROIDAB" in the last 72 hours.  Invalid input(s): "FREET3" Anemia work up: No results for input(s): "VITAMINB12", "FOLATE", "FERRITIN", "TIBC", "IRON", "RETICCTPCT" in the last 72 hours. Sepsis Labs: Recent Labs  Lab 04/18/23 0333 04/19/23 0258 04/20/23 0323 04/21/23 0312  WBC 7.4 8.3 7.0 7.5   Microbiology Recent Results (from the past 240 hours)  Surgical pcr screen     Status: None   Collection Time: 04/18/23  1:32 AM   Specimen: Nasal Mucosa; Nasal Swab  Result Value Ref Range Status   MRSA, PCR NEGATIVE NEGATIVE Final   Staphylococcus aureus NEGATIVE NEGATIVE Final    Comment: (NOTE) The Xpert SA Assay (FDA approved for NASAL specimens in patients 43 years of age and older), is one component of a comprehensive surveillance program. It is not intended to diagnose infection nor to guide or monitor treatment. Performed at [facility], [address], [place], [place] [address]      Medications:    acetaminophen  1,000 mg Oral TID   aspirin  81 mg Oral BID   busPIRone  10 mg Oral QHS   docusate sodium  100 mg Oral BID   famotidine  20 mg Oral BID   feeding supplement  237 mL Oral BID BM   metoprolol tartrate  50 mg Oral BID   pravastatin  40 mg Oral Daily   sertraline  100 mg Oral Daily   Continuous Infusions:    LOS: 3 days   Marinda Elk  [facility]  04/21/2023, 10:09 AM

## 2023-04-22 DIAGNOSIS — R278 Other lack of coordination: Secondary | ICD-10-CM | POA: Diagnosis not present

## 2023-04-22 DIAGNOSIS — S72141A Displaced intertrochanteric fracture of right femur, initial encounter for closed fracture: Secondary | ICD-10-CM | POA: Diagnosis not present

## 2023-04-22 DIAGNOSIS — Z5189 Encounter for other specified aftercare: Secondary | ICD-10-CM | POA: Diagnosis not present

## 2023-04-22 DIAGNOSIS — Z7401 Bed confinement status: Secondary | ICD-10-CM | POA: Diagnosis not present

## 2023-04-22 DIAGNOSIS — R2689 Other abnormalities of gait and mobility: Secondary | ICD-10-CM | POA: Diagnosis not present

## 2023-04-22 DIAGNOSIS — I252 Old myocardial infarction: Secondary | ICD-10-CM | POA: Diagnosis not present

## 2023-04-22 DIAGNOSIS — R531 Weakness: Secondary | ICD-10-CM | POA: Diagnosis not present

## 2023-04-22 DIAGNOSIS — S72142D Displaced intertrochanteric fracture of left femur, subsequent encounter for closed fracture with routine healing: Secondary | ICD-10-CM | POA: Diagnosis not present

## 2023-04-22 DIAGNOSIS — Z9181 History of falling: Secondary | ICD-10-CM | POA: Diagnosis not present

## 2023-04-22 DIAGNOSIS — I639 Cerebral infarction, unspecified: Secondary | ICD-10-CM | POA: Diagnosis not present

## 2023-04-22 DIAGNOSIS — R791 Abnormal coagulation profile: Secondary | ICD-10-CM | POA: Diagnosis not present

## 2023-04-22 DIAGNOSIS — E785 Hyperlipidemia, unspecified: Secondary | ICD-10-CM | POA: Diagnosis not present

## 2023-04-22 DIAGNOSIS — I471 Supraventricular tachycardia, unspecified: Secondary | ICD-10-CM | POA: Diagnosis not present

## 2023-04-22 DIAGNOSIS — M6281 Muscle weakness (generalized): Secondary | ICD-10-CM | POA: Diagnosis not present

## 2023-04-22 DIAGNOSIS — R279 Unspecified lack of coordination: Secondary | ICD-10-CM | POA: Diagnosis not present

## 2023-04-22 DIAGNOSIS — I6930 Unspecified sequelae of cerebral infarction: Secondary | ICD-10-CM | POA: Diagnosis not present

## 2023-04-22 DIAGNOSIS — Z7901 Long term (current) use of anticoagulants: Secondary | ICD-10-CM | POA: Diagnosis not present

## 2023-04-22 DIAGNOSIS — S7292XA Unspecified fracture of left femur, initial encounter for closed fracture: Secondary | ICD-10-CM | POA: Diagnosis not present

## 2023-04-22 DIAGNOSIS — I10 Essential (primary) hypertension: Secondary | ICD-10-CM | POA: Diagnosis not present

## 2023-04-22 DIAGNOSIS — D62 Acute posthemorrhagic anemia: Secondary | ICD-10-CM | POA: Diagnosis not present

## 2023-04-22 DIAGNOSIS — W19XXXA Unspecified fall, initial encounter: Secondary | ICD-10-CM | POA: Diagnosis not present

## 2023-04-22 DIAGNOSIS — F32A Depression, unspecified: Secondary | ICD-10-CM | POA: Diagnosis not present

## 2023-04-22 DIAGNOSIS — M1990 Unspecified osteoarthritis, unspecified site: Secondary | ICD-10-CM | POA: Diagnosis not present

## 2023-04-22 LAB — BPAM RBC
Blood Product Expiration Date: 202503022359
Blood Product Expiration Date: 202503022359
Blood Product Expiration Date: 202503022359
ISSUE DATE / TIME: 202501280742
Unit Type and Rh: 5100
Unit Type and Rh: 5100
Unit Type and Rh: 5100

## 2023-04-22 LAB — TYPE AND SCREEN
ABO/RH(D): O POS
Antibody Screen: NEGATIVE
Unit division: 0
Unit division: 0
Unit division: 0

## 2023-04-22 LAB — CBC
HCT: 26.4 % — ABNORMAL LOW (ref 36.0–46.0)
Hemoglobin: 8.6 g/dL — ABNORMAL LOW (ref 12.0–15.0)
MCH: 30.8 pg (ref 26.0–34.0)
MCHC: 32.6 g/dL (ref 30.0–36.0)
MCV: 94.6 fL (ref 80.0–100.0)
Platelets: 152 10*3/uL (ref 150–400)
RBC: 2.79 MIL/uL — ABNORMAL LOW (ref 3.87–5.11)
RDW: 14.5 % (ref 11.5–15.5)
WBC: 5.8 10*3/uL (ref 4.0–10.5)
nRBC: 0 % (ref 0.0–0.2)

## 2023-04-22 MED ORDER — APIXABAN 2.5 MG PO TABS: 2.5000 mg | ORAL_TABLET | Freq: Two times a day (BID) | ORAL | Status: AC

## 2023-04-22 MED ORDER — ASPIRIN 81 MG PO TBEC: 81.0000 mg | DELAYED_RELEASE_TABLET | Freq: Every day | ORAL | Status: AC

## 2023-04-22 NOTE — Care Management Important Message (Signed)
Important Message  Patient Details IM Letter given. Name: Susan Marsh MRN: [id number] Date of Birth: 1940/11/12   Important Message Given:  Yes - Medicare IM     Caren Macadam 04/22/2023, 11:12 AM

## 2023-04-22 NOTE — TOC Transition Note (Signed)
Transition of Care [facility]) - Discharge Note   Patient Details  Name: Susan Marsh MRN: [id number] Date of Birth: 1940/12/21  [facility]) CM/SW Contact:  Diona Browner, LCSW Phone Number: 04/22/2023, 1:07 PM   Clinical Narrative:     Pt medically cleared for dc to [facility] SN[facility] today. Pt and family aware and agreeable. [facility]tar called at 1pm. Nurse to call report to [phone]. No further TOC needs.   Final next level of care: Skilled Nursing Facility Barriers to Discharge: Barriers Resolved   Patient Goals and CMS Choice Patient states their goals for this hospitalization and ongoing recovery are:: return home following SNF rehab CMS Medicare.gov Compare Post Acute Care list provided to:: Patient Choice offered to / list presented to : Patient [place] ownership interest in [facility].provided to:: Patient    Discharge Placement   Existing PASRR number confirmed : 04/20/23          Patient chooses bed at: [facility] Patient to be transferred to facility by: [facility] Name of family member notified: son- [name] Patient and family notified of of transfer: 04/22/23  Discharge Plan and Services Additional resources added to the After Visit Summary for   In-house Referral: Clinical Social Work   Post Acute Care Choice: Skilled Nursing Facility          DME Arranged: N/A DME Agency: NA                  Social Drivers of Health (SDOH) Interventions SDOH Screenings   Food Insecurity: No Food Insecurity (04/18/2023)  Housing: Low Risk  (04/18/2023)  Transportation Needs: No Transportation Needs (04/18/2023)  Utilities: Not At Risk (04/18/2023)  Social Connections: Socially Isolated (04/18/2023)  Tobacco Use: Unknown (04/18/2023)     Readmission Risk Interventions    04/22/2023    1:04 PM 04/20/2023   12:46 PM  Readmission Risk Prevention Plan  Post Dischage Appt Complete Complete  Medication Screening Complete Complete   Transportation Screening Complete Complete

## 2023-04-22 NOTE — Discharge Summary (Signed)
Physician Discharge Summary  Susan Marsh WUJ:[id number] DOB: 07-Jul-1940 DOA: 04/17/2023  PCP: Noberto Retort, MD  Admit date: 04/17/2023 Discharge date: 04/22/2023  Admitted From: Home Disposition:  Home  Recommendations for Outpatient Follow-up:  Follow up with PCP in 1-2 weeks Please obtain BMP/CBC in one week   Home Health:No Equipment/Devices:None  Discharge Condition:Stable CODE STATUS:Full Diet recommendation: Heart Healthy   Brief/Interim Summary: 83 y.o. female past medical history of essential hypertension, CVA in 2023, CAD status post PCI in 2006, also history of SVT, she relates she was taking a bath and slipped and felt into her left hip denies any loss of consciousness, came into the ED was found to have a left intertrochanteric comminuted femur fracture   Discharge Diagnoses:  Principal Problem:   Closed fracture of intertrochanteric section of femur (HCC) Active Problems:   History of ischemic stroke   Hypokalemia   HTN (hypertension)   CAD (coronary artery disease)   Fall  Left closed comminuted fracture: Orthopedic surgery was consulted recommended surgical intervention. Status post ORIF on 04/18/2023. They recommended Eliquis for DVT prophylaxis. PT evaluated the patient will need skilled nursing facility. She continue Eliquis for 30 days.  Central hypertension: No changes made to her medication continue current home regimen.  Hypokalemia/hypomagnesemia: Now resolved.  History of CVA/CVA: Continue statin and beta-blockers.  History of SVT: Changes made to her medication continue metoprolol.  Thrombocytopenia: Postsurgical intervention and platelet dropped to 84 Eliquis was held for 48 hours. Her platelets started to trend up her Eliquis was resumed she will continue as an outpatient for 30 days.  Acute blood loss anemia: Her hemoglobin dropped to 6.3 postsurgical, she was transfused 2 units of blood cells her hemoglobin came up to  9.   Discharge Instructions  Discharge Instructions     Diet - low sodium heart healthy   Complete by: As directed    Increase activity slowly   Complete by: As directed       Allergies as of 04/22/2023       Reactions   Alendronate Sodium    Other reaction(s): GI upset   Other    Cortisporin otic Other reaction(s): GI upset        Medication List     TAKE these medications    acetaminophen 325 MG tablet Commonly known as: TYLENOL Take 650 mg by mouth 2 (two) times daily as needed for moderate pain (pain score 4-6).   amLODipine 10 MG tablet Commonly known as: NORVASC Take 10 mg by mouth daily.   apixaban 2.5 MG Tabs tablet Commonly known as: ELIQUIS Take 1 tablet (2.5 mg total) by mouth 2 (two) times daily.   aspirin EC 81 MG tablet Take 1 tablet (81 mg total) by mouth daily. Swallow whole. Start taking on: May 21, 2023 What changed: These instructions start on May 21, 2023. If you are unsure what to do until then, ask your doctor or other care provider.   busPIRone 10 MG tablet Commonly known as: BUSPAR Take 10 mg by mouth at bedtime.   famotidine 20 MG tablet Commonly known as: PEPCID Take 1 tablet (20 mg total) by mouth 2 (two) times daily.   lidocaine-prilocaine cream Commonly known as: EMLA Apply 1 Application topically as needed.   metoprolol tartrate 50 MG tablet Commonly known as: LOPRESSOR Take 50 mg by mouth 2 (two) times daily.   pravastatin 40 MG tablet Commonly known as: PRAVACHOL Take 40 mg by mouth daily.   sertraline 100  MG tablet Commonly known as: ZOLOFT Take 100 mg by mouth daily.        Contact information for follow-up providers     Schedule an appointment as soon as possible for a visit  with Connect with your PCP/Specialist as discussed.   Contact information: [email or web address] Call our physician referral line at [phone].             Contact information for  after-discharge care     Destination     [facility] .   Service: Skilled Nursing Contact information: [address] [place] [address] [phone]                    Allergies  Allergen Reactions   Alendronate Sodium     Other reaction(s): GI upset   Other     Cortisporin otic Other reaction(s): GI upset    Consultations: Orthopedics during   Procedures/Studies: ECHOCARDIOGRAM COMPLETE Result Date: 04/19/2023    ECHOCARDIOGRAM REPORT   Patient Name:   Susan Marsh Date of Exam: 04/19/2023 Medical Rec #:  [id number]         Height:       63.0 in Accession #:    [id number]        Weight:       121.3 lb Date of Birth:  05/24/1940        BSA:          1.563 m Patient Age:    82 years          BP:           130/74 mmHg Patient Gender: F                 HR:           77 bpm. Exam Location:  Inpatient Procedure: 2D Echo, Cardiac Doppler and Color Doppler Indications:    Abnormal EKG  History:        Patient has prior history of Echocardiogram examinations, most                 recent 03/02/2022. CAD, Stroke; Risk Factors:Hypertension.  Sonographer:    Amy Chionchio Referring Phys: [id number] FRANK ALUISIO IMPRESSIONS  1. Left ventricular ejection fraction, by estimation, is 70 to 75%. The left ventricle has hyperdynamic function . The left ventricle has no regional wall motion abnormalities. 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 Left ventricular diastolic parameters are consistent with Grade I diastolic dysfunction (impaired relaxation). There is a mild intracavitary gradient. Consider evaluation for HCM.  2. Right ventricular systolic function is normal. The right ventricular size is normal.  3. Left atrial size was moderately dilated.  4. The mitral valve is normal in structure. Mild to moderate posteriorly directed mitral valve regurgitation. No evidence of mitral  stenosis. No obvious signs of SAM.  5. The aortic valve is normal in structure. Aortic valve regurgitation is mild. No aortic stenosis is present.  6. The inferior vena cava is dilated in size with <50% respiratory variability, suggesting right atrial pressure of 15 mmHg. FINDINGS  Left Ventricle: Left ventricular ejection fraction, by estimation, is 70 to 75%. The left ventricle has hyperdynamic function. The left ventricle has no regional wall motion abnormalities. The left ventricular internal cavity size was small. 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 Left ventricular diastolic parameters are consistent with Grade I diastolic dysfunction (impaired  relaxation). Right Ventricle: The right ventricular size is normal. No increase in right ventricular wall thickness. Right ventricular systolic function is normal. Left Atrium: Left atrial size was moderately dilated. Right Atrium: Right atrial size was normal in size. Pericardium: There is no evidence of pericardial effusion. Mitral Valve: The mitral valve is normal in structure. Mild mitral valve regurgitation. No evidence of mitral valve stenosis. MV peak gradient, 7.8 mmHg. The mean mitral valve gradient is 3.0 mmHg. Tricuspid Valve: The tricuspid valve is normal in structure. Tricuspid valve regurgitation is trivial. No evidence of tricuspid stenosis. Aortic Valve: The aortic valve is normal in structure. Aortic valve regurgitation is mild. Aortic regurgitation PHT measures 671 msec. No aortic stenosis is present. Aortic valve mean gradient measures 8.0 mmHg. Aortic valve peak gradient measures 13.2 mmHg. Pulmonic Valve: The pulmonic valve was normal in structure. Pulmonic valve regurgitation is not visualized. No evidence of pulmonic stenosis. Aorta: The aortic root is normal in size and structure. Venous: The inferior vena cava is dilated in size with less than 50% respiratory variability, suggesting right atrial  pressure of 15 mmHg. IAS/Shunts: No atrial level shunt detected by color flow Doppler.  LEFT VENTRICLE PLAX 2D LVIDd:         3.10 cm     Diastology LVIDs:         2.20 cm     LV e' medial:    4.97 cm/s LV PW:         1.10 cm     LV E/e' medial:  15.3 LV IVS:        1.50 cm     LV e' lateral:   6.34 cm/s LVOT diam:     2.10 cm     LV E/e' lateral: 12.0 LVOT Area:     3.46 cm  LV Volumes (MOD) LV vol d, MOD A2C: 54.8 ml LV vol d, MOD A4C: 61.8 ml LV vol s, MOD A2C: 14.0 ml LV vol s, MOD A4C: 14.7 ml LV SV MOD A2C:     40.8 ml LV SV MOD A4C:     61.8 ml LV SV MOD BP:      44.8 ml RIGHT VENTRICLE          IVC RV Basal diam:  2.90 cm  IVC diam: 2.80 cm TAPSE (M-mode): 2.3 cm LEFT ATRIUM             Index        RIGHT ATRIUM           Index LA Vol (A2C):   62.7 ml 40.11 ml/m  RA Area:     12.40 cm LA Vol (A4C):   72.3 ml 46.25 ml/m  RA Volume:   24.60 ml  15.74 ml/m LA Biplane Vol: 72.8 ml 46.57 ml/m  AORTIC VALVE               PULMONIC VALVE AV Vmax:      182.00 cm/s  PV Vmax:       1.07 m/s AV Vmean:     135.000 cm/s PV Peak grad:  4.6 mmHg AV VTI:       0.347 m AV Peak Grad: 13.2 mmHg AV Mean Grad: 8.0 mmHg AI PHT:       671 msec  AORTA Ao Root diam: 2.60 cm Ao Asc diam:  2.90 cm MITRAL VALVE                TRICUSPID VALVE MV Area (PHT): 2.71 cm  TR Peak grad:   30.9 mmHg MV Peak grad:  7.8 mmHg     TR Vmax:        278.00 cm/s MV Mean grad:  3.0 mmHg MV Vmax:       1.40 m/s     SHUNTS MV Vmean:      85.6 cm/s    Systemic Diam: 2.10 cm MV Decel Time: 280 msec MV E velocity: 76.20 cm/s MV A velocity: 123.00 cm/s MV E/A ratio:  0.62 Aditya Sabharwal Electronically signed by Dorthula Nettles Signature Date/Time: 04/19/2023/1:27:21 PM    Final    DG FEMUR MIN 2 VIEWS LEFT Result Date: 04/18/2023 CLINICAL DATA:  Elective surgery. EXAM: LEFT FEMUR 2 VIEWS COMPARISON:  Preoperative imaging FINDINGS: Five fluoroscopic spot views of the left femur obtained in the operating room. Placement of femoral intramedullary  nail with trans trochanteric and distal locking screw fixation traversing proximal femur fracture. Fluoroscopy time 29 seconds. Dose 6.2 mGy. IMPRESSION: Procedural fluoroscopy during femur fracture fixation. Electronically Signed   By: Narda Rutherford M.D.   On: 04/18/2023 14:45   DG C-Arm 1-60 Min-No Report Result Date: 04/18/2023 Fluoroscopy was utilized by the requesting physician.  No radiographic interpretation.   DG CHEST PORT 1 VIEW Result Date: 04/18/2023 CLINICAL DATA:  Left hip fracture.  Pre-op clearance exam EXAM: PORTABLE CHEST 1 VIEW COMPARISON:  01/20/2022 FINDINGS: The heart size and mediastinal contours are within normal limits. Both lungs are clear. Left chest wall cardiac loop recorder noted. IMPRESSION: No active disease. Electronically Signed   By: Danae Orleans M.D.   On: 04/18/2023 09:58   DG Femur Portable 1 View Left Result Date: 04/17/2023 CLINICAL DATA:  Status post fall with left hip pain EXAM: PELVIS - 1 VIEW; LEFT FEMUR PORTABLE 1 VIEW COMPARISON:  None Available. FINDINGS: There is no evidence of acute displaced pelvic fracture or diastasis. Comminuted fracture of the left intertrochanteric femur. No pelvic bone lesions are seen. Degenerative changes of the partially imaged lumbar spine, bilateral sacroiliac joints, and hips. Vascular calcifications. IMPRESSION: Comminuted left intertrochanteric fracture. No acute displaced pelvic fractures. Electronically Signed   By: Agustin Cree M.D.   On: 04/17/2023 21:56   DG Pelvis 1-2 Views Result Date: 04/17/2023 CLINICAL DATA:  Status post fall with left hip pain EXAM: PELVIS - 1 VIEW; LEFT FEMUR PORTABLE 1 VIEW COMPARISON:  None Available. FINDINGS: There is no evidence of acute displaced pelvic fracture or diastasis. Comminuted fracture of the left intertrochanteric femur. No pelvic bone lesions are seen. Degenerative changes of the partially imaged lumbar spine, bilateral sacroiliac joints, and hips. Vascular calcifications.  IMPRESSION: Comminuted left intertrochanteric fracture. No acute displaced pelvic fractures. Electronically Signed   By: Agustin Cree M.D.   On: 04/17/2023 21:56   DG Knee 1-2 Views Left Result Date: 04/17/2023 CLINICAL DATA:  Fall onto left leg with knee pain EXAM: LEFT KNEE - 2 VIEW COMPARISON:  None Available. FINDINGS: The lateral most aspect of the knee and proximal fibula are not included within the field of view. No acute fracture or dislocation. Small joint effusion. Mild degenerative changes of the knee. IMPRESSION: No acute fracture or dislocation. Small joint effusion. Electronically Signed   By: Agustin Cree M.D.   On: 04/17/2023 21:53   (Echo, Carotid, EGD, Colonoscopy, ERCP)    Subjective: No complaints  Discharge Exam: Vitals:   04/21/23 2222 04/22/23 0522  BP: (!) 144/61 (!) 147/72  Pulse: 82 79  Resp: 16 16  Temp: 99.5 F (37.5  C) 99 F (37.2 C)  SpO2: 95% 96%   Vitals:   04/21/23 0819 04/21/23 1358 04/21/23 2222 04/22/23 0522  BP: (!) 168/76 139/74 (!) 144/61 (!) 147/72  Pulse: 73 81 82 79  Resp:  17 16 16   Temp:  98.9 F (37.2 C) 99.5 F (37.5 C) 99 F (37.2 C)  TempSrc:  Oral Oral Oral  SpO2: 97% 97% 95% 96%  Weight:      Height:        General: Pt is alert, awake, not in acute distress Cardiovascular: RRR, S1/S2 +, no rubs, no gallops Respiratory: CTA bilaterally, no wheezing, no rhonchi Abdominal: Soft, NT, ND, bowel sounds + Extremities: no edema, no cyanosis    The results of significant diagnostics from this hospitalization (including imaging, microbiology, ancillary and laboratory) are listed below for reference.     Microbiology: Recent Results (from the past 240 hours)  Surgical pcr screen     Status: None   Collection Time: 04/18/23  1:32 AM   Specimen: Nasal Mucosa; Nasal Swab  Result Value Ref Range Status   MRSA, PCR NEGATIVE NEGATIVE Final   Staphylococcus aureus NEGATIVE NEGATIVE Final    Comment: (NOTE) The Xpert SA Assay (FDA  approved for NASAL specimens in patients 64 years of age and older), is one component of a comprehensive surveillance program. It is not intended to diagnose infection nor to guide or monitor treatment. Performed at [facility], [address], [place], [place] [address]      Labs: BNP (last 3 results) No results for input(s): "BNP" in the last 8760 hours. Basic Metabolic Panel: Recent Labs  Lab 04/17/23 2130 04/18/23 0333 04/19/23 0258 04/20/23 0323 04/21/23 0312  NA 140 139 138 140 141  K 3.2* 3.6 3.9 3.8 4.2  CL 103 106 107 106 106  CO2 27 24 21* 27 28  GLUCOSE 124* 144* 142* 104* 101*  BUN 25* 29* 26* 23 21  CREATININE 1.01* 1.01* 0.85 0.74 0.65  CALCIUM 9.5 9.1 8.5* 8.1* 9.0  MG  --  2.0  --   --   --    Liver Function Tests: No results for input(s): "AST", "ALT", "ALKPHOS", "BILITOT", "PROT", "ALBUMIN" in the last 168 hours. No results for input(s): "LIPASE", "AMYLASE" in the last 168 hours. No results for input(s): "AMMONIA" in the last 168 hours. CBC: Recent Labs  Lab 04/18/23 0333 04/19/23 0258 04/20/23 0323 04/20/23 1200 04/21/23 0312 04/22/23 0341  WBC 7.4 8.3 7.0  --  7.5 5.8  HGB 10.3* 8.1* 6.3* 8.9* 9.0* 8.6*  HCT 31.4* 24.3* 19.9* 27.5* 27.8* 26.4*  MCV 92.4 93.5 95.7  --  95.9 94.6  PLT 174 84* 106*  --  125* 152   Cardiac Enzymes: No results for input(s): "CKTOTAL", "CKMB", "CKMBINDEX", "TROPONINI" in the last 168 hours. BNP: Invalid input(s): "POCBNP" CBG: No results for input(s): "GLUCAP" in the last 168 hours. D-Dimer No results for input(s): "DDIMER" in the last 72 hours. Hgb A1c No results for input(s): "HGBA1C" in the last 72 hours. Lipid Profile No results for input(s): "CHOL", "HDL", "LDLCALC", "TRIG", "CHOLHDL", "LDLDIRECT" in the last 72 hours. Thyroid function studies No results for input(s): "TSH", "T4TOTAL", "T3FREE", "THYROIDAB" in the last 72 hours.  Invalid input(s): "FREET3" Anemia work up No  results for input(s): "VITAMINB12", "FOLATE", "FERRITIN", "TIBC", "IRON", "RETICCTPCT" in the last 72 hours. Urinalysis    Component Value Date/Time   COLORURINE YELLOW 03/02/2022 1415   APPEARANCEUR CLEAR 03/02/2022 1415  LABSPEC 1.034 (H) 03/02/2022 1415   PHURINE 7.0 03/02/2022 1415   GLUCOSEU NEGATIVE 03/02/2022 1415   HGBUR NEGATIVE 03/02/2022 1415   BILIRUBINUR NEGATIVE 03/02/2022 1415   KETONESUR NEGATIVE 03/02/2022 1415   PROTEINUR TRACE (A) 03/02/2022 1415   NITRITE NEGATIVE 03/02/2022 1415   LEUKOCYTESUR LARGE (A) 03/02/2022 1415   Sepsis Labs Recent Labs  Lab 04/19/23 0258 04/20/23 0323 04/21/23 0312 04/22/23 0341  WBC 8.3 7.0 7.5 5.8   Microbiology Recent Results (from the past 240 hours)  Surgical pcr screen     Status: None   Collection Time: 04/18/23  1:32 AM   Specimen: Nasal Mucosa; Nasal Swab  Result Value Ref Range Status   MRSA, PCR NEGATIVE NEGATIVE Final   Staphylococcus aureus NEGATIVE NEGATIVE Final    Comment: (NOTE) The Xpert SA Assay (FDA approved for NASAL specimens in patients 40 years of age and older), is one component of a comprehensive surveillance program. It is not intended to diagnose infection nor to guide or monitor treatment. Performed at [facility], [address], [place], [place] [address]      Time coordinating discharge: Over 35 minutes  SIGNED:   Marinda Elk, MD  [facility] 04/22/2023, 9:24 AM Pager   If 7PM-7AM, please contact night-coverage [email or web address] Password [email or web address]

## 2023-04-23 DIAGNOSIS — I471 Supraventricular tachycardia, unspecified: Secondary | ICD-10-CM | POA: Diagnosis not present

## 2023-04-23 DIAGNOSIS — S72142D Displaced intertrochanteric fracture of left femur, subsequent encounter for closed fracture with routine healing: Secondary | ICD-10-CM | POA: Diagnosis not present

## 2023-04-23 DIAGNOSIS — Z9181 History of falling: Secondary | ICD-10-CM | POA: Diagnosis not present

## 2023-04-23 DIAGNOSIS — M6281 Muscle weakness (generalized): Secondary | ICD-10-CM | POA: Diagnosis not present

## 2023-04-23 DIAGNOSIS — Z7901 Long term (current) use of anticoagulants: Secondary | ICD-10-CM | POA: Diagnosis not present

## 2023-04-23 DIAGNOSIS — I252 Old myocardial infarction: Secondary | ICD-10-CM | POA: Diagnosis not present

## 2023-04-23 DIAGNOSIS — I10 Essential (primary) hypertension: Secondary | ICD-10-CM | POA: Diagnosis not present

## 2023-04-23 DIAGNOSIS — D62 Acute posthemorrhagic anemia: Secondary | ICD-10-CM | POA: Diagnosis not present

## 2023-04-23 DIAGNOSIS — F32A Depression, unspecified: Secondary | ICD-10-CM | POA: Diagnosis not present

## 2023-04-23 DIAGNOSIS — M1990 Unspecified osteoarthritis, unspecified site: Secondary | ICD-10-CM | POA: Diagnosis not present

## 2023-04-23 DIAGNOSIS — R2689 Other abnormalities of gait and mobility: Secondary | ICD-10-CM | POA: Diagnosis not present

## 2023-04-23 DIAGNOSIS — S7292XA Unspecified fracture of left femur, initial encounter for closed fracture: Secondary | ICD-10-CM | POA: Diagnosis not present

## 2023-04-23 DIAGNOSIS — I6930 Unspecified sequelae of cerebral infarction: Secondary | ICD-10-CM | POA: Diagnosis not present

## 2023-04-23 DIAGNOSIS — E785 Hyperlipidemia, unspecified: Secondary | ICD-10-CM | POA: Diagnosis not present

## 2023-04-26 ENCOUNTER — Ambulatory Visit (INDEPENDENT_AMBULATORY_CARE_PROVIDER_SITE_OTHER): Payer: Medicare HMO

## 2023-04-26 DIAGNOSIS — I252 Old myocardial infarction: Secondary | ICD-10-CM | POA: Diagnosis not present

## 2023-04-26 DIAGNOSIS — Z7901 Long term (current) use of anticoagulants: Secondary | ICD-10-CM | POA: Diagnosis not present

## 2023-04-26 DIAGNOSIS — D62 Acute posthemorrhagic anemia: Secondary | ICD-10-CM | POA: Diagnosis not present

## 2023-04-26 DIAGNOSIS — I639 Cerebral infarction, unspecified: Secondary | ICD-10-CM | POA: Diagnosis not present

## 2023-04-26 DIAGNOSIS — I471 Supraventricular tachycardia, unspecified: Secondary | ICD-10-CM | POA: Diagnosis not present

## 2023-04-26 DIAGNOSIS — I6930 Unspecified sequelae of cerebral infarction: Secondary | ICD-10-CM | POA: Diagnosis not present

## 2023-04-26 DIAGNOSIS — I10 Essential (primary) hypertension: Secondary | ICD-10-CM | POA: Diagnosis not present

## 2023-04-26 DIAGNOSIS — S7292XA Unspecified fracture of left femur, initial encounter for closed fracture: Secondary | ICD-10-CM | POA: Diagnosis not present

## 2023-04-26 LAB — CUP PACEART REMOTE DEVICE CHECK
Date Time Interrogation Session: 20250201054106
Implantable Pulse Generator Implant Date: 20231215

## 2023-04-27 DIAGNOSIS — I10 Essential (primary) hypertension: Secondary | ICD-10-CM | POA: Diagnosis not present

## 2023-04-27 DIAGNOSIS — D62 Acute posthemorrhagic anemia: Secondary | ICD-10-CM | POA: Diagnosis not present

## 2023-04-27 DIAGNOSIS — I252 Old myocardial infarction: Secondary | ICD-10-CM | POA: Diagnosis not present

## 2023-04-27 DIAGNOSIS — I6930 Unspecified sequelae of cerebral infarction: Secondary | ICD-10-CM | POA: Diagnosis not present

## 2023-04-27 DIAGNOSIS — I471 Supraventricular tachycardia, unspecified: Secondary | ICD-10-CM | POA: Diagnosis not present

## 2023-04-27 DIAGNOSIS — Z5189 Encounter for other specified aftercare: Secondary | ICD-10-CM | POA: Diagnosis not present

## 2023-04-27 DIAGNOSIS — Z7901 Long term (current) use of anticoagulants: Secondary | ICD-10-CM | POA: Diagnosis not present

## 2023-04-27 DIAGNOSIS — S7292XA Unspecified fracture of left femur, initial encounter for closed fracture: Secondary | ICD-10-CM | POA: Diagnosis not present

## 2023-04-28 DIAGNOSIS — D62 Acute posthemorrhagic anemia: Secondary | ICD-10-CM | POA: Diagnosis not present

## 2023-04-28 DIAGNOSIS — I10 Essential (primary) hypertension: Secondary | ICD-10-CM | POA: Diagnosis not present

## 2023-04-28 DIAGNOSIS — Z7901 Long term (current) use of anticoagulants: Secondary | ICD-10-CM | POA: Diagnosis not present

## 2023-04-28 DIAGNOSIS — I6930 Unspecified sequelae of cerebral infarction: Secondary | ICD-10-CM | POA: Diagnosis not present

## 2023-04-28 DIAGNOSIS — I252 Old myocardial infarction: Secondary | ICD-10-CM | POA: Diagnosis not present

## 2023-04-28 DIAGNOSIS — S7292XA Unspecified fracture of left femur, initial encounter for closed fracture: Secondary | ICD-10-CM | POA: Diagnosis not present

## 2023-04-28 DIAGNOSIS — I471 Supraventricular tachycardia, unspecified: Secondary | ICD-10-CM | POA: Diagnosis not present

## 2023-04-30 DIAGNOSIS — I471 Supraventricular tachycardia, unspecified: Secondary | ICD-10-CM | POA: Diagnosis not present

## 2023-04-30 DIAGNOSIS — F32A Depression, unspecified: Secondary | ICD-10-CM | POA: Diagnosis not present

## 2023-04-30 DIAGNOSIS — I10 Essential (primary) hypertension: Secondary | ICD-10-CM | POA: Diagnosis not present

## 2023-04-30 DIAGNOSIS — M6281 Muscle weakness (generalized): Secondary | ICD-10-CM | POA: Diagnosis not present

## 2023-04-30 DIAGNOSIS — D62 Acute posthemorrhagic anemia: Secondary | ICD-10-CM | POA: Diagnosis not present

## 2023-04-30 DIAGNOSIS — S7292XA Unspecified fracture of left femur, initial encounter for closed fracture: Secondary | ICD-10-CM | POA: Diagnosis not present

## 2023-04-30 DIAGNOSIS — I252 Old myocardial infarction: Secondary | ICD-10-CM | POA: Diagnosis not present

## 2023-04-30 DIAGNOSIS — R2689 Other abnormalities of gait and mobility: Secondary | ICD-10-CM | POA: Diagnosis not present

## 2023-04-30 DIAGNOSIS — S72142D Displaced intertrochanteric fracture of left femur, subsequent encounter for closed fracture with routine healing: Secondary | ICD-10-CM | POA: Diagnosis not present

## 2023-04-30 DIAGNOSIS — Z7901 Long term (current) use of anticoagulants: Secondary | ICD-10-CM | POA: Diagnosis not present

## 2023-04-30 DIAGNOSIS — Z9181 History of falling: Secondary | ICD-10-CM | POA: Diagnosis not present

## 2023-04-30 DIAGNOSIS — I6930 Unspecified sequelae of cerebral infarction: Secondary | ICD-10-CM | POA: Diagnosis not present

## 2023-05-01 DIAGNOSIS — R791 Abnormal coagulation profile: Secondary | ICD-10-CM | POA: Diagnosis not present

## 2023-05-03 DIAGNOSIS — I6930 Unspecified sequelae of cerebral infarction: Secondary | ICD-10-CM | POA: Diagnosis not present

## 2023-05-03 DIAGNOSIS — D62 Acute posthemorrhagic anemia: Secondary | ICD-10-CM | POA: Diagnosis not present

## 2023-05-03 DIAGNOSIS — I10 Essential (primary) hypertension: Secondary | ICD-10-CM | POA: Diagnosis not present

## 2023-05-03 DIAGNOSIS — I252 Old myocardial infarction: Secondary | ICD-10-CM | POA: Diagnosis not present

## 2023-05-03 DIAGNOSIS — Z7901 Long term (current) use of anticoagulants: Secondary | ICD-10-CM | POA: Diagnosis not present

## 2023-05-03 DIAGNOSIS — I471 Supraventricular tachycardia, unspecified: Secondary | ICD-10-CM | POA: Diagnosis not present

## 2023-05-03 DIAGNOSIS — S7292XA Unspecified fracture of left femur, initial encounter for closed fracture: Secondary | ICD-10-CM | POA: Diagnosis not present

## 2023-05-04 DIAGNOSIS — S72142D Displaced intertrochanteric fracture of left femur, subsequent encounter for closed fracture with routine healing: Secondary | ICD-10-CM | POA: Diagnosis not present

## 2023-05-04 DIAGNOSIS — R2689 Other abnormalities of gait and mobility: Secondary | ICD-10-CM | POA: Diagnosis not present

## 2023-05-04 DIAGNOSIS — F32A Depression, unspecified: Secondary | ICD-10-CM | POA: Diagnosis not present

## 2023-05-04 DIAGNOSIS — M6281 Muscle weakness (generalized): Secondary | ICD-10-CM | POA: Diagnosis not present

## 2023-05-04 DIAGNOSIS — Z9181 History of falling: Secondary | ICD-10-CM | POA: Diagnosis not present

## 2023-05-07 DIAGNOSIS — S72142D Displaced intertrochanteric fracture of left femur, subsequent encounter for closed fracture with routine healing: Secondary | ICD-10-CM | POA: Diagnosis not present

## 2023-05-07 DIAGNOSIS — M6281 Muscle weakness (generalized): Secondary | ICD-10-CM | POA: Diagnosis not present

## 2023-05-07 DIAGNOSIS — F32A Depression, unspecified: Secondary | ICD-10-CM | POA: Diagnosis not present

## 2023-05-07 DIAGNOSIS — R2689 Other abnormalities of gait and mobility: Secondary | ICD-10-CM | POA: Diagnosis not present

## 2023-05-07 DIAGNOSIS — Z9181 History of falling: Secondary | ICD-10-CM | POA: Diagnosis not present

## 2023-05-11 DIAGNOSIS — M6281 Muscle weakness (generalized): Secondary | ICD-10-CM | POA: Diagnosis not present

## 2023-05-11 DIAGNOSIS — Z9181 History of falling: Secondary | ICD-10-CM | POA: Diagnosis not present

## 2023-05-11 DIAGNOSIS — R2689 Other abnormalities of gait and mobility: Secondary | ICD-10-CM | POA: Diagnosis not present

## 2023-05-11 DIAGNOSIS — S72142D Displaced intertrochanteric fracture of left femur, subsequent encounter for closed fracture with routine healing: Secondary | ICD-10-CM | POA: Diagnosis not present

## 2023-05-11 DIAGNOSIS — F32A Depression, unspecified: Secondary | ICD-10-CM | POA: Diagnosis not present

## 2023-05-12 DIAGNOSIS — I6930 Unspecified sequelae of cerebral infarction: Secondary | ICD-10-CM | POA: Diagnosis not present

## 2023-05-12 DIAGNOSIS — I252 Old myocardial infarction: Secondary | ICD-10-CM | POA: Diagnosis not present

## 2023-05-12 DIAGNOSIS — I471 Supraventricular tachycardia, unspecified: Secondary | ICD-10-CM | POA: Diagnosis not present

## 2023-05-12 DIAGNOSIS — I10 Essential (primary) hypertension: Secondary | ICD-10-CM | POA: Diagnosis not present

## 2023-05-12 DIAGNOSIS — S7292XA Unspecified fracture of left femur, initial encounter for closed fracture: Secondary | ICD-10-CM | POA: Diagnosis not present

## 2023-05-12 DIAGNOSIS — Z7901 Long term (current) use of anticoagulants: Secondary | ICD-10-CM | POA: Diagnosis not present

## 2023-05-12 DIAGNOSIS — D62 Acute posthemorrhagic anemia: Secondary | ICD-10-CM | POA: Diagnosis not present

## 2023-05-14 DIAGNOSIS — F32A Depression, unspecified: Secondary | ICD-10-CM | POA: Diagnosis not present

## 2023-05-14 DIAGNOSIS — R2689 Other abnormalities of gait and mobility: Secondary | ICD-10-CM | POA: Diagnosis not present

## 2023-05-14 DIAGNOSIS — Z9181 History of falling: Secondary | ICD-10-CM | POA: Diagnosis not present

## 2023-05-14 DIAGNOSIS — M6281 Muscle weakness (generalized): Secondary | ICD-10-CM | POA: Diagnosis not present

## 2023-05-14 DIAGNOSIS — S72142D Displaced intertrochanteric fracture of left femur, subsequent encounter for closed fracture with routine healing: Secondary | ICD-10-CM | POA: Diagnosis not present

## 2023-05-17 DIAGNOSIS — Z5189 Encounter for other specified aftercare: Secondary | ICD-10-CM | POA: Diagnosis not present

## 2023-05-20 DIAGNOSIS — I252 Old myocardial infarction: Secondary | ICD-10-CM | POA: Diagnosis not present

## 2023-05-20 DIAGNOSIS — I6930 Unspecified sequelae of cerebral infarction: Secondary | ICD-10-CM | POA: Diagnosis not present

## 2023-05-20 DIAGNOSIS — Z7901 Long term (current) use of anticoagulants: Secondary | ICD-10-CM | POA: Diagnosis not present

## 2023-05-20 DIAGNOSIS — E785 Hyperlipidemia, unspecified: Secondary | ICD-10-CM | POA: Diagnosis not present

## 2023-05-20 DIAGNOSIS — S7292XA Unspecified fracture of left femur, initial encounter for closed fracture: Secondary | ICD-10-CM | POA: Diagnosis not present

## 2023-05-20 DIAGNOSIS — M1990 Unspecified osteoarthritis, unspecified site: Secondary | ICD-10-CM | POA: Diagnosis not present

## 2023-05-20 DIAGNOSIS — I10 Essential (primary) hypertension: Secondary | ICD-10-CM | POA: Diagnosis not present

## 2023-05-20 DIAGNOSIS — I471 Supraventricular tachycardia, unspecified: Secondary | ICD-10-CM | POA: Diagnosis not present

## 2023-05-20 DIAGNOSIS — D62 Acute posthemorrhagic anemia: Secondary | ICD-10-CM | POA: Diagnosis not present

## 2023-05-28 DIAGNOSIS — D62 Acute posthemorrhagic anemia: Secondary | ICD-10-CM | POA: Diagnosis not present

## 2023-05-28 DIAGNOSIS — I10 Essential (primary) hypertension: Secondary | ICD-10-CM | POA: Diagnosis not present

## 2023-05-28 DIAGNOSIS — I252 Old myocardial infarction: Secondary | ICD-10-CM | POA: Diagnosis not present

## 2023-05-28 DIAGNOSIS — S7292XA Unspecified fracture of left femur, initial encounter for closed fracture: Secondary | ICD-10-CM | POA: Diagnosis not present

## 2023-05-28 DIAGNOSIS — Z7901 Long term (current) use of anticoagulants: Secondary | ICD-10-CM | POA: Diagnosis not present

## 2023-05-28 DIAGNOSIS — I471 Supraventricular tachycardia, unspecified: Secondary | ICD-10-CM | POA: Diagnosis not present

## 2023-05-28 DIAGNOSIS — I6930 Unspecified sequelae of cerebral infarction: Secondary | ICD-10-CM | POA: Diagnosis not present

## 2023-05-29 DIAGNOSIS — F32A Depression, unspecified: Secondary | ICD-10-CM | POA: Diagnosis not present

## 2023-05-29 DIAGNOSIS — I2510 Atherosclerotic heart disease of native coronary artery without angina pectoris: Secondary | ICD-10-CM | POA: Diagnosis not present

## 2023-05-29 DIAGNOSIS — S72002D Fracture of unspecified part of neck of left femur, subsequent encounter for closed fracture with routine healing: Secondary | ICD-10-CM | POA: Diagnosis not present

## 2023-05-29 DIAGNOSIS — D62 Acute posthemorrhagic anemia: Secondary | ICD-10-CM | POA: Diagnosis not present

## 2023-05-29 DIAGNOSIS — I69354 Hemiplegia and hemiparesis following cerebral infarction affecting left non-dominant side: Secondary | ICD-10-CM | POA: Diagnosis not present

## 2023-05-29 DIAGNOSIS — I10 Essential (primary) hypertension: Secondary | ICD-10-CM | POA: Diagnosis not present

## 2023-05-29 DIAGNOSIS — I471 Supraventricular tachycardia, unspecified: Secondary | ICD-10-CM | POA: Diagnosis not present

## 2023-05-29 DIAGNOSIS — I252 Old myocardial infarction: Secondary | ICD-10-CM | POA: Diagnosis not present

## 2023-05-29 DIAGNOSIS — E785 Hyperlipidemia, unspecified: Secondary | ICD-10-CM | POA: Diagnosis not present

## 2023-05-31 ENCOUNTER — Ambulatory Visit (INDEPENDENT_AMBULATORY_CARE_PROVIDER_SITE_OTHER): Payer: Medicare HMO

## 2023-05-31 DIAGNOSIS — I639 Cerebral infarction, unspecified: Secondary | ICD-10-CM

## 2023-05-31 DIAGNOSIS — F32A Depression, unspecified: Secondary | ICD-10-CM | POA: Diagnosis not present

## 2023-05-31 DIAGNOSIS — I2510 Atherosclerotic heart disease of native coronary artery without angina pectoris: Secondary | ICD-10-CM | POA: Diagnosis not present

## 2023-05-31 DIAGNOSIS — I10 Essential (primary) hypertension: Secondary | ICD-10-CM | POA: Diagnosis not present

## 2023-05-31 DIAGNOSIS — I69354 Hemiplegia and hemiparesis following cerebral infarction affecting left non-dominant side: Secondary | ICD-10-CM | POA: Diagnosis not present

## 2023-05-31 DIAGNOSIS — S72002D Fracture of unspecified part of neck of left femur, subsequent encounter for closed fracture with routine healing: Secondary | ICD-10-CM | POA: Diagnosis not present

## 2023-05-31 DIAGNOSIS — D62 Acute posthemorrhagic anemia: Secondary | ICD-10-CM | POA: Diagnosis not present

## 2023-05-31 DIAGNOSIS — E785 Hyperlipidemia, unspecified: Secondary | ICD-10-CM | POA: Diagnosis not present

## 2023-05-31 DIAGNOSIS — I471 Supraventricular tachycardia, unspecified: Secondary | ICD-10-CM | POA: Diagnosis not present

## 2023-05-31 DIAGNOSIS — I252 Old myocardial infarction: Secondary | ICD-10-CM | POA: Diagnosis not present

## 2023-05-31 LAB — CUP PACEART REMOTE DEVICE CHECK
Date Time Interrogation Session: 20250309231737
Implantable Pulse Generator Implant Date: 20231215

## 2023-06-02 NOTE — Progress Notes (Signed)
 [facility] Summary Report / Loop Recorder

## 2023-06-03 DIAGNOSIS — D62 Acute posthemorrhagic anemia: Secondary | ICD-10-CM | POA: Diagnosis not present

## 2023-06-03 DIAGNOSIS — I10 Essential (primary) hypertension: Secondary | ICD-10-CM | POA: Diagnosis not present

## 2023-06-03 DIAGNOSIS — F32A Depression, unspecified: Secondary | ICD-10-CM | POA: Diagnosis not present

## 2023-06-03 DIAGNOSIS — I252 Old myocardial infarction: Secondary | ICD-10-CM | POA: Diagnosis not present

## 2023-06-03 DIAGNOSIS — S72002D Fracture of unspecified part of neck of left femur, subsequent encounter for closed fracture with routine healing: Secondary | ICD-10-CM | POA: Diagnosis not present

## 2023-06-03 DIAGNOSIS — I69354 Hemiplegia and hemiparesis following cerebral infarction affecting left non-dominant side: Secondary | ICD-10-CM | POA: Diagnosis not present

## 2023-06-03 DIAGNOSIS — I2510 Atherosclerotic heart disease of native coronary artery without angina pectoris: Secondary | ICD-10-CM | POA: Diagnosis not present

## 2023-06-03 DIAGNOSIS — E785 Hyperlipidemia, unspecified: Secondary | ICD-10-CM | POA: Diagnosis not present

## 2023-06-03 DIAGNOSIS — I471 Supraventricular tachycardia, unspecified: Secondary | ICD-10-CM | POA: Diagnosis not present

## 2023-06-04 DIAGNOSIS — I471 Supraventricular tachycardia, unspecified: Secondary | ICD-10-CM | POA: Diagnosis not present

## 2023-06-04 DIAGNOSIS — E785 Hyperlipidemia, unspecified: Secondary | ICD-10-CM | POA: Diagnosis not present

## 2023-06-04 DIAGNOSIS — S72002D Fracture of unspecified part of neck of left femur, subsequent encounter for closed fracture with routine healing: Secondary | ICD-10-CM | POA: Diagnosis not present

## 2023-06-04 DIAGNOSIS — D62 Acute posthemorrhagic anemia: Secondary | ICD-10-CM | POA: Diagnosis not present

## 2023-06-04 DIAGNOSIS — I10 Essential (primary) hypertension: Secondary | ICD-10-CM | POA: Diagnosis not present

## 2023-06-04 DIAGNOSIS — I69354 Hemiplegia and hemiparesis following cerebral infarction affecting left non-dominant side: Secondary | ICD-10-CM | POA: Diagnosis not present

## 2023-06-04 DIAGNOSIS — I2510 Atherosclerotic heart disease of native coronary artery without angina pectoris: Secondary | ICD-10-CM | POA: Diagnosis not present

## 2023-06-04 DIAGNOSIS — I252 Old myocardial infarction: Secondary | ICD-10-CM | POA: Diagnosis not present

## 2023-06-04 DIAGNOSIS — F32A Depression, unspecified: Secondary | ICD-10-CM | POA: Diagnosis not present

## 2023-06-07 DIAGNOSIS — E785 Hyperlipidemia, unspecified: Secondary | ICD-10-CM | POA: Diagnosis not present

## 2023-06-07 DIAGNOSIS — I10 Essential (primary) hypertension: Secondary | ICD-10-CM | POA: Diagnosis not present

## 2023-06-07 DIAGNOSIS — I69354 Hemiplegia and hemiparesis following cerebral infarction affecting left non-dominant side: Secondary | ICD-10-CM | POA: Diagnosis not present

## 2023-06-07 DIAGNOSIS — I252 Old myocardial infarction: Secondary | ICD-10-CM | POA: Diagnosis not present

## 2023-06-07 DIAGNOSIS — F32A Depression, unspecified: Secondary | ICD-10-CM | POA: Diagnosis not present

## 2023-06-07 DIAGNOSIS — D62 Acute posthemorrhagic anemia: Secondary | ICD-10-CM | POA: Diagnosis not present

## 2023-06-07 DIAGNOSIS — S72002D Fracture of unspecified part of neck of left femur, subsequent encounter for closed fracture with routine healing: Secondary | ICD-10-CM | POA: Diagnosis not present

## 2023-06-07 DIAGNOSIS — I471 Supraventricular tachycardia, unspecified: Secondary | ICD-10-CM | POA: Diagnosis not present

## 2023-06-07 DIAGNOSIS — I2510 Atherosclerotic heart disease of native coronary artery without angina pectoris: Secondary | ICD-10-CM | POA: Diagnosis not present

## 2023-06-10 DIAGNOSIS — S72142D Displaced intertrochanteric fracture of left femur, subsequent encounter for closed fracture with routine healing: Secondary | ICD-10-CM | POA: Diagnosis not present

## 2023-06-10 DIAGNOSIS — Z5189 Encounter for other specified aftercare: Secondary | ICD-10-CM | POA: Diagnosis not present

## 2023-06-11 DIAGNOSIS — I69354 Hemiplegia and hemiparesis following cerebral infarction affecting left non-dominant side: Secondary | ICD-10-CM | POA: Diagnosis not present

## 2023-06-11 DIAGNOSIS — E785 Hyperlipidemia, unspecified: Secondary | ICD-10-CM | POA: Diagnosis not present

## 2023-06-11 DIAGNOSIS — I10 Essential (primary) hypertension: Secondary | ICD-10-CM | POA: Diagnosis not present

## 2023-06-11 DIAGNOSIS — I471 Supraventricular tachycardia, unspecified: Secondary | ICD-10-CM | POA: Diagnosis not present

## 2023-06-11 DIAGNOSIS — I252 Old myocardial infarction: Secondary | ICD-10-CM | POA: Diagnosis not present

## 2023-06-11 DIAGNOSIS — I2510 Atherosclerotic heart disease of native coronary artery without angina pectoris: Secondary | ICD-10-CM | POA: Diagnosis not present

## 2023-06-11 DIAGNOSIS — D62 Acute posthemorrhagic anemia: Secondary | ICD-10-CM | POA: Diagnosis not present

## 2023-06-11 DIAGNOSIS — F32A Depression, unspecified: Secondary | ICD-10-CM | POA: Diagnosis not present

## 2023-06-11 DIAGNOSIS — S72002D Fracture of unspecified part of neck of left femur, subsequent encounter for closed fracture with routine healing: Secondary | ICD-10-CM | POA: Diagnosis not present

## 2023-06-14 DIAGNOSIS — I471 Supraventricular tachycardia, unspecified: Secondary | ICD-10-CM | POA: Diagnosis not present

## 2023-06-14 DIAGNOSIS — S72002D Fracture of unspecified part of neck of left femur, subsequent encounter for closed fracture with routine healing: Secondary | ICD-10-CM | POA: Diagnosis not present

## 2023-06-14 DIAGNOSIS — I252 Old myocardial infarction: Secondary | ICD-10-CM | POA: Diagnosis not present

## 2023-06-14 DIAGNOSIS — I69354 Hemiplegia and hemiparesis following cerebral infarction affecting left non-dominant side: Secondary | ICD-10-CM | POA: Diagnosis not present

## 2023-06-14 DIAGNOSIS — F32A Depression, unspecified: Secondary | ICD-10-CM | POA: Diagnosis not present

## 2023-06-14 DIAGNOSIS — D62 Acute posthemorrhagic anemia: Secondary | ICD-10-CM | POA: Diagnosis not present

## 2023-06-14 DIAGNOSIS — E785 Hyperlipidemia, unspecified: Secondary | ICD-10-CM | POA: Diagnosis not present

## 2023-06-14 DIAGNOSIS — I10 Essential (primary) hypertension: Secondary | ICD-10-CM | POA: Diagnosis not present

## 2023-06-14 DIAGNOSIS — I2510 Atherosclerotic heart disease of native coronary artery without angina pectoris: Secondary | ICD-10-CM | POA: Diagnosis not present

## 2023-06-17 DIAGNOSIS — S72002D Fracture of unspecified part of neck of left femur, subsequent encounter for closed fracture with routine healing: Secondary | ICD-10-CM | POA: Diagnosis not present

## 2023-06-17 DIAGNOSIS — I2510 Atherosclerotic heart disease of native coronary artery without angina pectoris: Secondary | ICD-10-CM | POA: Diagnosis not present

## 2023-06-17 DIAGNOSIS — I10 Essential (primary) hypertension: Secondary | ICD-10-CM | POA: Diagnosis not present

## 2023-06-17 DIAGNOSIS — I69354 Hemiplegia and hemiparesis following cerebral infarction affecting left non-dominant side: Secondary | ICD-10-CM | POA: Diagnosis not present

## 2023-06-17 DIAGNOSIS — I252 Old myocardial infarction: Secondary | ICD-10-CM | POA: Diagnosis not present

## 2023-06-17 DIAGNOSIS — D62 Acute posthemorrhagic anemia: Secondary | ICD-10-CM | POA: Diagnosis not present

## 2023-06-17 DIAGNOSIS — F32A Depression, unspecified: Secondary | ICD-10-CM | POA: Diagnosis not present

## 2023-06-17 DIAGNOSIS — E785 Hyperlipidemia, unspecified: Secondary | ICD-10-CM | POA: Diagnosis not present

## 2023-06-17 DIAGNOSIS — I471 Supraventricular tachycardia, unspecified: Secondary | ICD-10-CM | POA: Diagnosis not present

## 2023-06-21 DIAGNOSIS — I10 Essential (primary) hypertension: Secondary | ICD-10-CM | POA: Diagnosis not present

## 2023-06-21 DIAGNOSIS — F32A Depression, unspecified: Secondary | ICD-10-CM | POA: Diagnosis not present

## 2023-06-21 DIAGNOSIS — I69354 Hemiplegia and hemiparesis following cerebral infarction affecting left non-dominant side: Secondary | ICD-10-CM | POA: Diagnosis not present

## 2023-06-21 DIAGNOSIS — I252 Old myocardial infarction: Secondary | ICD-10-CM | POA: Diagnosis not present

## 2023-06-21 DIAGNOSIS — E785 Hyperlipidemia, unspecified: Secondary | ICD-10-CM | POA: Diagnosis not present

## 2023-06-21 DIAGNOSIS — I2510 Atherosclerotic heart disease of native coronary artery without angina pectoris: Secondary | ICD-10-CM | POA: Diagnosis not present

## 2023-06-21 DIAGNOSIS — S72002D Fracture of unspecified part of neck of left femur, subsequent encounter for closed fracture with routine healing: Secondary | ICD-10-CM | POA: Diagnosis not present

## 2023-06-21 DIAGNOSIS — I471 Supraventricular tachycardia, unspecified: Secondary | ICD-10-CM | POA: Diagnosis not present

## 2023-06-21 DIAGNOSIS — D62 Acute posthemorrhagic anemia: Secondary | ICD-10-CM | POA: Diagnosis not present

## 2023-06-23 DIAGNOSIS — S72002D Fracture of unspecified part of neck of left femur, subsequent encounter for closed fracture with routine healing: Secondary | ICD-10-CM | POA: Diagnosis not present

## 2023-06-23 DIAGNOSIS — I252 Old myocardial infarction: Secondary | ICD-10-CM | POA: Diagnosis not present

## 2023-06-23 DIAGNOSIS — I69354 Hemiplegia and hemiparesis following cerebral infarction affecting left non-dominant side: Secondary | ICD-10-CM | POA: Diagnosis not present

## 2023-06-23 DIAGNOSIS — E785 Hyperlipidemia, unspecified: Secondary | ICD-10-CM | POA: Diagnosis not present

## 2023-06-23 DIAGNOSIS — I2510 Atherosclerotic heart disease of native coronary artery without angina pectoris: Secondary | ICD-10-CM | POA: Diagnosis not present

## 2023-06-23 DIAGNOSIS — F32A Depression, unspecified: Secondary | ICD-10-CM | POA: Diagnosis not present

## 2023-06-23 DIAGNOSIS — I471 Supraventricular tachycardia, unspecified: Secondary | ICD-10-CM | POA: Diagnosis not present

## 2023-06-23 DIAGNOSIS — I10 Essential (primary) hypertension: Secondary | ICD-10-CM | POA: Diagnosis not present

## 2023-06-23 DIAGNOSIS — D62 Acute posthemorrhagic anemia: Secondary | ICD-10-CM | POA: Diagnosis not present

## 2023-06-24 DIAGNOSIS — F32A Depression, unspecified: Secondary | ICD-10-CM | POA: Diagnosis not present

## 2023-06-24 DIAGNOSIS — I69354 Hemiplegia and hemiparesis following cerebral infarction affecting left non-dominant side: Secondary | ICD-10-CM | POA: Diagnosis not present

## 2023-06-24 DIAGNOSIS — I10 Essential (primary) hypertension: Secondary | ICD-10-CM | POA: Diagnosis not present

## 2023-06-24 DIAGNOSIS — E785 Hyperlipidemia, unspecified: Secondary | ICD-10-CM | POA: Diagnosis not present

## 2023-06-24 DIAGNOSIS — I471 Supraventricular tachycardia, unspecified: Secondary | ICD-10-CM | POA: Diagnosis not present

## 2023-06-24 DIAGNOSIS — I2510 Atherosclerotic heart disease of native coronary artery without angina pectoris: Secondary | ICD-10-CM | POA: Diagnosis not present

## 2023-06-24 DIAGNOSIS — S72002D Fracture of unspecified part of neck of left femur, subsequent encounter for closed fracture with routine healing: Secondary | ICD-10-CM | POA: Diagnosis not present

## 2023-06-24 DIAGNOSIS — D62 Acute posthemorrhagic anemia: Secondary | ICD-10-CM | POA: Diagnosis not present

## 2023-06-24 DIAGNOSIS — I252 Old myocardial infarction: Secondary | ICD-10-CM | POA: Diagnosis not present

## 2023-06-26 DIAGNOSIS — I252 Old myocardial infarction: Secondary | ICD-10-CM | POA: Diagnosis not present

## 2023-06-26 DIAGNOSIS — I69354 Hemiplegia and hemiparesis following cerebral infarction affecting left non-dominant side: Secondary | ICD-10-CM | POA: Diagnosis not present

## 2023-06-26 DIAGNOSIS — S72002D Fracture of unspecified part of neck of left femur, subsequent encounter for closed fracture with routine healing: Secondary | ICD-10-CM | POA: Diagnosis not present

## 2023-06-26 DIAGNOSIS — F32A Depression, unspecified: Secondary | ICD-10-CM | POA: Diagnosis not present

## 2023-06-26 DIAGNOSIS — I2510 Atherosclerotic heart disease of native coronary artery without angina pectoris: Secondary | ICD-10-CM | POA: Diagnosis not present

## 2023-06-26 DIAGNOSIS — E785 Hyperlipidemia, unspecified: Secondary | ICD-10-CM | POA: Diagnosis not present

## 2023-06-26 DIAGNOSIS — D62 Acute posthemorrhagic anemia: Secondary | ICD-10-CM | POA: Diagnosis not present

## 2023-06-26 DIAGNOSIS — I471 Supraventricular tachycardia, unspecified: Secondary | ICD-10-CM | POA: Diagnosis not present

## 2023-06-26 DIAGNOSIS — I10 Essential (primary) hypertension: Secondary | ICD-10-CM | POA: Diagnosis not present

## 2023-06-28 DIAGNOSIS — F32A Depression, unspecified: Secondary | ICD-10-CM | POA: Diagnosis not present

## 2023-06-28 DIAGNOSIS — S72002D Fracture of unspecified part of neck of left femur, subsequent encounter for closed fracture with routine healing: Secondary | ICD-10-CM | POA: Diagnosis not present

## 2023-06-28 DIAGNOSIS — D62 Acute posthemorrhagic anemia: Secondary | ICD-10-CM | POA: Diagnosis not present

## 2023-06-28 DIAGNOSIS — I69354 Hemiplegia and hemiparesis following cerebral infarction affecting left non-dominant side: Secondary | ICD-10-CM | POA: Diagnosis not present

## 2023-06-28 DIAGNOSIS — I252 Old myocardial infarction: Secondary | ICD-10-CM | POA: Diagnosis not present

## 2023-06-28 DIAGNOSIS — I10 Essential (primary) hypertension: Secondary | ICD-10-CM | POA: Diagnosis not present

## 2023-06-28 DIAGNOSIS — E785 Hyperlipidemia, unspecified: Secondary | ICD-10-CM | POA: Diagnosis not present

## 2023-06-28 DIAGNOSIS — I2510 Atherosclerotic heart disease of native coronary artery without angina pectoris: Secondary | ICD-10-CM | POA: Diagnosis not present

## 2023-06-28 DIAGNOSIS — I471 Supraventricular tachycardia, unspecified: Secondary | ICD-10-CM | POA: Diagnosis not present

## 2023-06-30 DIAGNOSIS — D62 Acute posthemorrhagic anemia: Secondary | ICD-10-CM | POA: Diagnosis not present

## 2023-06-30 DIAGNOSIS — E785 Hyperlipidemia, unspecified: Secondary | ICD-10-CM | POA: Diagnosis not present

## 2023-06-30 DIAGNOSIS — F32A Depression, unspecified: Secondary | ICD-10-CM | POA: Diagnosis not present

## 2023-06-30 DIAGNOSIS — I69354 Hemiplegia and hemiparesis following cerebral infarction affecting left non-dominant side: Secondary | ICD-10-CM | POA: Diagnosis not present

## 2023-06-30 DIAGNOSIS — S72002D Fracture of unspecified part of neck of left femur, subsequent encounter for closed fracture with routine healing: Secondary | ICD-10-CM | POA: Diagnosis not present

## 2023-06-30 DIAGNOSIS — I252 Old myocardial infarction: Secondary | ICD-10-CM | POA: Diagnosis not present

## 2023-06-30 DIAGNOSIS — I2510 Atherosclerotic heart disease of native coronary artery without angina pectoris: Secondary | ICD-10-CM | POA: Diagnosis not present

## 2023-06-30 DIAGNOSIS — I471 Supraventricular tachycardia, unspecified: Secondary | ICD-10-CM | POA: Diagnosis not present

## 2023-06-30 DIAGNOSIS — I10 Essential (primary) hypertension: Secondary | ICD-10-CM | POA: Diagnosis not present

## 2023-07-05 ENCOUNTER — Ambulatory Visit (INDEPENDENT_AMBULATORY_CARE_PROVIDER_SITE_OTHER): Payer: Medicare HMO

## 2023-07-05 DIAGNOSIS — I639 Cerebral infarction, unspecified: Secondary | ICD-10-CM

## 2023-07-05 DIAGNOSIS — E785 Hyperlipidemia, unspecified: Secondary | ICD-10-CM | POA: Diagnosis not present

## 2023-07-05 DIAGNOSIS — I471 Supraventricular tachycardia, unspecified: Secondary | ICD-10-CM | POA: Diagnosis not present

## 2023-07-05 DIAGNOSIS — D62 Acute posthemorrhagic anemia: Secondary | ICD-10-CM | POA: Diagnosis not present

## 2023-07-05 DIAGNOSIS — S72002D Fracture of unspecified part of neck of left femur, subsequent encounter for closed fracture with routine healing: Secondary | ICD-10-CM | POA: Diagnosis not present

## 2023-07-05 DIAGNOSIS — F32A Depression, unspecified: Secondary | ICD-10-CM | POA: Diagnosis not present

## 2023-07-05 DIAGNOSIS — I2510 Atherosclerotic heart disease of native coronary artery without angina pectoris: Secondary | ICD-10-CM | POA: Diagnosis not present

## 2023-07-05 DIAGNOSIS — I10 Essential (primary) hypertension: Secondary | ICD-10-CM | POA: Diagnosis not present

## 2023-07-05 DIAGNOSIS — I69354 Hemiplegia and hemiparesis following cerebral infarction affecting left non-dominant side: Secondary | ICD-10-CM | POA: Diagnosis not present

## 2023-07-05 DIAGNOSIS — I252 Old myocardial infarction: Secondary | ICD-10-CM | POA: Diagnosis not present

## 2023-07-05 LAB — CUP PACEART REMOTE DEVICE CHECK
Date Time Interrogation Session: 20250413231647
Implantable Pulse Generator Implant Date: 20231215

## 2023-07-05 NOTE — Progress Notes (Signed)
 [facility] Summary Report / Loop Recorder

## 2023-07-10 DIAGNOSIS — I69354 Hemiplegia and hemiparesis following cerebral infarction affecting left non-dominant side: Secondary | ICD-10-CM | POA: Diagnosis not present

## 2023-07-10 DIAGNOSIS — I471 Supraventricular tachycardia, unspecified: Secondary | ICD-10-CM | POA: Diagnosis not present

## 2023-07-10 DIAGNOSIS — I252 Old myocardial infarction: Secondary | ICD-10-CM | POA: Diagnosis not present

## 2023-07-10 DIAGNOSIS — E785 Hyperlipidemia, unspecified: Secondary | ICD-10-CM | POA: Diagnosis not present

## 2023-07-10 DIAGNOSIS — S72002D Fracture of unspecified part of neck of left femur, subsequent encounter for closed fracture with routine healing: Secondary | ICD-10-CM | POA: Diagnosis not present

## 2023-07-10 DIAGNOSIS — F32A Depression, unspecified: Secondary | ICD-10-CM | POA: Diagnosis not present

## 2023-07-10 DIAGNOSIS — D62 Acute posthemorrhagic anemia: Secondary | ICD-10-CM | POA: Diagnosis not present

## 2023-07-10 DIAGNOSIS — I10 Essential (primary) hypertension: Secondary | ICD-10-CM | POA: Diagnosis not present

## 2023-07-10 DIAGNOSIS — I2510 Atherosclerotic heart disease of native coronary artery without angina pectoris: Secondary | ICD-10-CM | POA: Diagnosis not present

## 2023-07-14 DIAGNOSIS — D62 Acute posthemorrhagic anemia: Secondary | ICD-10-CM | POA: Diagnosis not present

## 2023-07-14 DIAGNOSIS — I471 Supraventricular tachycardia, unspecified: Secondary | ICD-10-CM | POA: Diagnosis not present

## 2023-07-14 DIAGNOSIS — I10 Essential (primary) hypertension: Secondary | ICD-10-CM | POA: Diagnosis not present

## 2023-07-14 DIAGNOSIS — S72002D Fracture of unspecified part of neck of left femur, subsequent encounter for closed fracture with routine healing: Secondary | ICD-10-CM | POA: Diagnosis not present

## 2023-07-14 DIAGNOSIS — I2510 Atherosclerotic heart disease of native coronary artery without angina pectoris: Secondary | ICD-10-CM | POA: Diagnosis not present

## 2023-07-14 DIAGNOSIS — E785 Hyperlipidemia, unspecified: Secondary | ICD-10-CM | POA: Diagnosis not present

## 2023-07-14 DIAGNOSIS — I69354 Hemiplegia and hemiparesis following cerebral infarction affecting left non-dominant side: Secondary | ICD-10-CM | POA: Diagnosis not present

## 2023-07-14 DIAGNOSIS — F32A Depression, unspecified: Secondary | ICD-10-CM | POA: Diagnosis not present

## 2023-07-14 DIAGNOSIS — I252 Old myocardial infarction: Secondary | ICD-10-CM | POA: Diagnosis not present

## 2023-07-21 DIAGNOSIS — I471 Supraventricular tachycardia, unspecified: Secondary | ICD-10-CM | POA: Diagnosis not present

## 2023-07-21 DIAGNOSIS — I252 Old myocardial infarction: Secondary | ICD-10-CM | POA: Diagnosis not present

## 2023-07-21 DIAGNOSIS — S72002D Fracture of unspecified part of neck of left femur, subsequent encounter for closed fracture with routine healing: Secondary | ICD-10-CM | POA: Diagnosis not present

## 2023-07-21 DIAGNOSIS — F32A Depression, unspecified: Secondary | ICD-10-CM | POA: Diagnosis not present

## 2023-07-21 DIAGNOSIS — D62 Acute posthemorrhagic anemia: Secondary | ICD-10-CM | POA: Diagnosis not present

## 2023-07-21 DIAGNOSIS — I10 Essential (primary) hypertension: Secondary | ICD-10-CM | POA: Diagnosis not present

## 2023-07-21 DIAGNOSIS — I2510 Atherosclerotic heart disease of native coronary artery without angina pectoris: Secondary | ICD-10-CM | POA: Diagnosis not present

## 2023-07-21 DIAGNOSIS — I69354 Hemiplegia and hemiparesis following cerebral infarction affecting left non-dominant side: Secondary | ICD-10-CM | POA: Diagnosis not present

## 2023-07-21 DIAGNOSIS — E785 Hyperlipidemia, unspecified: Secondary | ICD-10-CM | POA: Diagnosis not present

## 2023-07-22 DIAGNOSIS — I2510 Atherosclerotic heart disease of native coronary artery without angina pectoris: Secondary | ICD-10-CM | POA: Diagnosis not present

## 2023-07-22 DIAGNOSIS — E785 Hyperlipidemia, unspecified: Secondary | ICD-10-CM | POA: Diagnosis not present

## 2023-07-22 DIAGNOSIS — I10 Essential (primary) hypertension: Secondary | ICD-10-CM | POA: Diagnosis not present

## 2023-07-22 DIAGNOSIS — Z5189 Encounter for other specified aftercare: Secondary | ICD-10-CM | POA: Diagnosis not present

## 2023-07-22 DIAGNOSIS — S72002D Fracture of unspecified part of neck of left femur, subsequent encounter for closed fracture with routine healing: Secondary | ICD-10-CM | POA: Diagnosis not present

## 2023-07-22 DIAGNOSIS — I69354 Hemiplegia and hemiparesis following cerebral infarction affecting left non-dominant side: Secondary | ICD-10-CM | POA: Diagnosis not present

## 2023-07-22 DIAGNOSIS — I471 Supraventricular tachycardia, unspecified: Secondary | ICD-10-CM | POA: Diagnosis not present

## 2023-07-22 DIAGNOSIS — I252 Old myocardial infarction: Secondary | ICD-10-CM | POA: Diagnosis not present

## 2023-07-22 DIAGNOSIS — D62 Acute posthemorrhagic anemia: Secondary | ICD-10-CM | POA: Diagnosis not present

## 2023-07-22 DIAGNOSIS — F32A Depression, unspecified: Secondary | ICD-10-CM | POA: Diagnosis not present

## 2023-07-26 DIAGNOSIS — S72002D Fracture of unspecified part of neck of left femur, subsequent encounter for closed fracture with routine healing: Secondary | ICD-10-CM | POA: Diagnosis not present

## 2023-07-26 DIAGNOSIS — I69354 Hemiplegia and hemiparesis following cerebral infarction affecting left non-dominant side: Secondary | ICD-10-CM | POA: Diagnosis not present

## 2023-07-26 DIAGNOSIS — I252 Old myocardial infarction: Secondary | ICD-10-CM | POA: Diagnosis not present

## 2023-07-26 DIAGNOSIS — I10 Essential (primary) hypertension: Secondary | ICD-10-CM | POA: Diagnosis not present

## 2023-07-26 DIAGNOSIS — D62 Acute posthemorrhagic anemia: Secondary | ICD-10-CM | POA: Diagnosis not present

## 2023-07-26 DIAGNOSIS — F32A Depression, unspecified: Secondary | ICD-10-CM | POA: Diagnosis not present

## 2023-07-26 DIAGNOSIS — I2510 Atherosclerotic heart disease of native coronary artery without angina pectoris: Secondary | ICD-10-CM | POA: Diagnosis not present

## 2023-07-26 DIAGNOSIS — E785 Hyperlipidemia, unspecified: Secondary | ICD-10-CM | POA: Diagnosis not present

## 2023-07-26 DIAGNOSIS — I471 Supraventricular tachycardia, unspecified: Secondary | ICD-10-CM | POA: Diagnosis not present

## 2023-07-27 DIAGNOSIS — I252 Old myocardial infarction: Secondary | ICD-10-CM | POA: Diagnosis not present

## 2023-07-27 DIAGNOSIS — D62 Acute posthemorrhagic anemia: Secondary | ICD-10-CM | POA: Diagnosis not present

## 2023-07-27 DIAGNOSIS — F32A Depression, unspecified: Secondary | ICD-10-CM | POA: Diagnosis not present

## 2023-07-27 DIAGNOSIS — S72002D Fracture of unspecified part of neck of left femur, subsequent encounter for closed fracture with routine healing: Secondary | ICD-10-CM | POA: Diagnosis not present

## 2023-07-27 DIAGNOSIS — E785 Hyperlipidemia, unspecified: Secondary | ICD-10-CM | POA: Diagnosis not present

## 2023-07-27 DIAGNOSIS — I69354 Hemiplegia and hemiparesis following cerebral infarction affecting left non-dominant side: Secondary | ICD-10-CM | POA: Diagnosis not present

## 2023-07-27 DIAGNOSIS — I471 Supraventricular tachycardia, unspecified: Secondary | ICD-10-CM | POA: Diagnosis not present

## 2023-07-27 DIAGNOSIS — I2510 Atherosclerotic heart disease of native coronary artery without angina pectoris: Secondary | ICD-10-CM | POA: Diagnosis not present

## 2023-07-27 DIAGNOSIS — I10 Essential (primary) hypertension: Secondary | ICD-10-CM | POA: Diagnosis not present

## 2023-07-28 DIAGNOSIS — S72142D Displaced intertrochanteric fracture of left femur, subsequent encounter for closed fracture with routine healing: Secondary | ICD-10-CM | POA: Diagnosis not present

## 2023-07-28 DIAGNOSIS — I2510 Atherosclerotic heart disease of native coronary artery without angina pectoris: Secondary | ICD-10-CM | POA: Diagnosis not present

## 2023-07-28 DIAGNOSIS — I10 Essential (primary) hypertension: Secondary | ICD-10-CM | POA: Diagnosis not present

## 2023-07-28 DIAGNOSIS — I69354 Hemiplegia and hemiparesis following cerebral infarction affecting left non-dominant side: Secondary | ICD-10-CM | POA: Diagnosis not present

## 2023-08-02 DIAGNOSIS — I10 Essential (primary) hypertension: Secondary | ICD-10-CM | POA: Diagnosis not present

## 2023-08-02 DIAGNOSIS — H9193 Unspecified hearing loss, bilateral: Secondary | ICD-10-CM | POA: Diagnosis not present

## 2023-08-02 DIAGNOSIS — F32A Depression, unspecified: Secondary | ICD-10-CM | POA: Diagnosis not present

## 2023-08-02 DIAGNOSIS — E785 Hyperlipidemia, unspecified: Secondary | ICD-10-CM | POA: Diagnosis not present

## 2023-08-02 DIAGNOSIS — I252 Old myocardial infarction: Secondary | ICD-10-CM | POA: Diagnosis not present

## 2023-08-02 DIAGNOSIS — I471 Supraventricular tachycardia, unspecified: Secondary | ICD-10-CM | POA: Diagnosis not present

## 2023-08-02 DIAGNOSIS — S72142D Displaced intertrochanteric fracture of left femur, subsequent encounter for closed fracture with routine healing: Secondary | ICD-10-CM | POA: Diagnosis not present

## 2023-08-02 DIAGNOSIS — I2510 Atherosclerotic heart disease of native coronary artery without angina pectoris: Secondary | ICD-10-CM | POA: Diagnosis not present

## 2023-08-02 DIAGNOSIS — I69354 Hemiplegia and hemiparesis following cerebral infarction affecting left non-dominant side: Secondary | ICD-10-CM | POA: Diagnosis not present

## 2023-08-09 ENCOUNTER — Ambulatory Visit (INDEPENDENT_AMBULATORY_CARE_PROVIDER_SITE_OTHER): Payer: Medicare HMO

## 2023-08-09 DIAGNOSIS — F32A Depression, unspecified: Secondary | ICD-10-CM | POA: Diagnosis not present

## 2023-08-09 DIAGNOSIS — I471 Supraventricular tachycardia, unspecified: Secondary | ICD-10-CM | POA: Diagnosis not present

## 2023-08-09 DIAGNOSIS — I639 Cerebral infarction, unspecified: Secondary | ICD-10-CM

## 2023-08-09 DIAGNOSIS — S72142D Displaced intertrochanteric fracture of left femur, subsequent encounter for closed fracture with routine healing: Secondary | ICD-10-CM | POA: Diagnosis not present

## 2023-08-09 DIAGNOSIS — E785 Hyperlipidemia, unspecified: Secondary | ICD-10-CM | POA: Diagnosis not present

## 2023-08-09 DIAGNOSIS — I2510 Atherosclerotic heart disease of native coronary artery without angina pectoris: Secondary | ICD-10-CM | POA: Diagnosis not present

## 2023-08-09 DIAGNOSIS — H9193 Unspecified hearing loss, bilateral: Secondary | ICD-10-CM | POA: Diagnosis not present

## 2023-08-09 DIAGNOSIS — I69354 Hemiplegia and hemiparesis following cerebral infarction affecting left non-dominant side: Secondary | ICD-10-CM | POA: Diagnosis not present

## 2023-08-09 DIAGNOSIS — I10 Essential (primary) hypertension: Secondary | ICD-10-CM | POA: Diagnosis not present

## 2023-08-09 DIAGNOSIS — I252 Old myocardial infarction: Secondary | ICD-10-CM | POA: Diagnosis not present

## 2023-08-09 LAB — CUP PACEART REMOTE DEVICE CHECK
Date Time Interrogation Session: 20250518232558
Implantable Pulse Generator Implant Date: 20231215

## 2023-08-11 DIAGNOSIS — F32A Depression, unspecified: Secondary | ICD-10-CM | POA: Diagnosis not present

## 2023-08-11 DIAGNOSIS — I69354 Hemiplegia and hemiparesis following cerebral infarction affecting left non-dominant side: Secondary | ICD-10-CM | POA: Diagnosis not present

## 2023-08-11 DIAGNOSIS — I10 Essential (primary) hypertension: Secondary | ICD-10-CM | POA: Diagnosis not present

## 2023-08-11 DIAGNOSIS — H9193 Unspecified hearing loss, bilateral: Secondary | ICD-10-CM | POA: Diagnosis not present

## 2023-08-11 DIAGNOSIS — I2510 Atherosclerotic heart disease of native coronary artery without angina pectoris: Secondary | ICD-10-CM | POA: Diagnosis not present

## 2023-08-11 DIAGNOSIS — I471 Supraventricular tachycardia, unspecified: Secondary | ICD-10-CM | POA: Diagnosis not present

## 2023-08-11 DIAGNOSIS — S72142D Displaced intertrochanteric fracture of left femur, subsequent encounter for closed fracture with routine healing: Secondary | ICD-10-CM | POA: Diagnosis not present

## 2023-08-11 DIAGNOSIS — E785 Hyperlipidemia, unspecified: Secondary | ICD-10-CM | POA: Diagnosis not present

## 2023-08-11 DIAGNOSIS — I252 Old myocardial infarction: Secondary | ICD-10-CM | POA: Diagnosis not present

## 2023-08-16 ENCOUNTER — Ambulatory Visit: Payer: Self-pay | Admitting: Cardiovascular Disease

## 2023-08-19 DIAGNOSIS — E785 Hyperlipidemia, unspecified: Secondary | ICD-10-CM | POA: Diagnosis not present

## 2023-08-19 DIAGNOSIS — I471 Supraventricular tachycardia, unspecified: Secondary | ICD-10-CM | POA: Diagnosis not present

## 2023-08-19 DIAGNOSIS — I10 Essential (primary) hypertension: Secondary | ICD-10-CM | POA: Diagnosis not present

## 2023-08-19 DIAGNOSIS — I69354 Hemiplegia and hemiparesis following cerebral infarction affecting left non-dominant side: Secondary | ICD-10-CM | POA: Diagnosis not present

## 2023-08-19 DIAGNOSIS — I2510 Atherosclerotic heart disease of native coronary artery without angina pectoris: Secondary | ICD-10-CM | POA: Diagnosis not present

## 2023-08-19 DIAGNOSIS — F32A Depression, unspecified: Secondary | ICD-10-CM | POA: Diagnosis not present

## 2023-08-19 DIAGNOSIS — S72142D Displaced intertrochanteric fracture of left femur, subsequent encounter for closed fracture with routine healing: Secondary | ICD-10-CM | POA: Diagnosis not present

## 2023-08-19 DIAGNOSIS — H9193 Unspecified hearing loss, bilateral: Secondary | ICD-10-CM | POA: Diagnosis not present

## 2023-08-19 DIAGNOSIS — I252 Old myocardial infarction: Secondary | ICD-10-CM | POA: Diagnosis not present

## 2023-08-24 NOTE — Progress Notes (Signed)
 [facility] Summary Report / Loop Recorder

## 2023-08-25 DIAGNOSIS — H9193 Unspecified hearing loss, bilateral: Secondary | ICD-10-CM | POA: Diagnosis not present

## 2023-08-25 DIAGNOSIS — E785 Hyperlipidemia, unspecified: Secondary | ICD-10-CM | POA: Diagnosis not present

## 2023-08-25 DIAGNOSIS — F32A Depression, unspecified: Secondary | ICD-10-CM | POA: Diagnosis not present

## 2023-08-25 DIAGNOSIS — I252 Old myocardial infarction: Secondary | ICD-10-CM | POA: Diagnosis not present

## 2023-08-25 DIAGNOSIS — S72142D Displaced intertrochanteric fracture of left femur, subsequent encounter for closed fracture with routine healing: Secondary | ICD-10-CM | POA: Diagnosis not present

## 2023-08-25 DIAGNOSIS — I69354 Hemiplegia and hemiparesis following cerebral infarction affecting left non-dominant side: Secondary | ICD-10-CM | POA: Diagnosis not present

## 2023-08-25 DIAGNOSIS — I10 Essential (primary) hypertension: Secondary | ICD-10-CM | POA: Diagnosis not present

## 2023-08-25 DIAGNOSIS — I2510 Atherosclerotic heart disease of native coronary artery without angina pectoris: Secondary | ICD-10-CM | POA: Diagnosis not present

## 2023-08-25 DIAGNOSIS — I471 Supraventricular tachycardia, unspecified: Secondary | ICD-10-CM | POA: Diagnosis not present

## 2023-09-02 DIAGNOSIS — M7062 Trochanteric bursitis, left hip: Secondary | ICD-10-CM | POA: Diagnosis not present

## 2023-09-02 DIAGNOSIS — S72142G Displaced intertrochanteric fracture of left femur, subsequent encounter for closed fracture with delayed healing: Secondary | ICD-10-CM | POA: Diagnosis not present

## 2023-09-02 DIAGNOSIS — Z5189 Encounter for other specified aftercare: Secondary | ICD-10-CM | POA: Diagnosis not present

## 2023-09-09 ENCOUNTER — Ambulatory Visit (INDEPENDENT_AMBULATORY_CARE_PROVIDER_SITE_OTHER)

## 2023-09-09 DIAGNOSIS — I639 Cerebral infarction, unspecified: Secondary | ICD-10-CM | POA: Diagnosis not present

## 2023-09-09 LAB — CUP PACEART REMOTE DEVICE CHECK
Date Time Interrogation Session: 20250618232431
Implantable Pulse Generator Implant Date: 20231215

## 2023-09-10 ENCOUNTER — Ambulatory Visit: Payer: Self-pay | Admitting: Cardiovascular Disease

## 2023-09-28 NOTE — Progress Notes (Signed)
 [facility] Summary Report / Loop Recorder

## 2023-10-08 DIAGNOSIS — F3341 Major depressive disorder, recurrent, in partial remission: Secondary | ICD-10-CM | POA: Diagnosis not present

## 2023-10-08 DIAGNOSIS — E46 Unspecified protein-calorie malnutrition: Secondary | ICD-10-CM | POA: Diagnosis not present

## 2023-10-08 DIAGNOSIS — E7800 Pure hypercholesterolemia, unspecified: Secondary | ICD-10-CM | POA: Diagnosis not present

## 2023-10-08 DIAGNOSIS — Z0000 Encounter for general adult medical examination without abnormal findings: Secondary | ICD-10-CM | POA: Diagnosis not present

## 2023-10-08 DIAGNOSIS — G629 Polyneuropathy, unspecified: Secondary | ICD-10-CM | POA: Diagnosis not present

## 2023-10-08 DIAGNOSIS — F419 Anxiety disorder, unspecified: Secondary | ICD-10-CM | POA: Diagnosis not present

## 2023-10-08 DIAGNOSIS — I679 Cerebrovascular disease, unspecified: Secondary | ICD-10-CM | POA: Diagnosis not present

## 2023-10-08 DIAGNOSIS — R7303 Prediabetes: Secondary | ICD-10-CM | POA: Diagnosis not present

## 2023-10-08 DIAGNOSIS — I10 Essential (primary) hypertension: Secondary | ICD-10-CM | POA: Diagnosis not present

## 2023-10-11 ENCOUNTER — Ambulatory Visit: Attending: Cardiovascular Disease

## 2023-10-11 DIAGNOSIS — I639 Cerebral infarction, unspecified: Secondary | ICD-10-CM

## 2023-10-12 LAB — CUP PACEART REMOTE DEVICE CHECK
Date Time Interrogation Session: 20250720233207
Implantable Pulse Generator Implant Date: 20231215

## 2023-10-17 ENCOUNTER — Ambulatory Visit: Payer: Self-pay | Admitting: Cardiovascular Disease

## 2023-10-21 DIAGNOSIS — I2510 Atherosclerotic heart disease of native coronary artery without angina pectoris: Secondary | ICD-10-CM | POA: Diagnosis not present

## 2023-10-21 DIAGNOSIS — M810 Age-related osteoporosis without current pathological fracture: Secondary | ICD-10-CM | POA: Diagnosis not present

## 2023-10-21 DIAGNOSIS — I350 Nonrheumatic aortic (valve) stenosis: Secondary | ICD-10-CM | POA: Diagnosis not present

## 2023-10-21 DIAGNOSIS — I639 Cerebral infarction, unspecified: Secondary | ICD-10-CM | POA: Diagnosis not present

## 2023-10-21 DIAGNOSIS — I10 Essential (primary) hypertension: Secondary | ICD-10-CM | POA: Diagnosis not present

## 2023-10-21 DIAGNOSIS — F3341 Major depressive disorder, recurrent, in partial remission: Secondary | ICD-10-CM | POA: Diagnosis not present

## 2023-11-09 NOTE — Progress Notes (Signed)
 [facility] Summary Report / Loop Recorder

## 2023-11-11 ENCOUNTER — Ambulatory Visit (INDEPENDENT_AMBULATORY_CARE_PROVIDER_SITE_OTHER)

## 2023-11-11 DIAGNOSIS — I639 Cerebral infarction, unspecified: Secondary | ICD-10-CM | POA: Diagnosis not present

## 2023-11-11 LAB — CUP PACEART REMOTE DEVICE CHECK
Date Time Interrogation Session: 20250820233942
Implantable Pulse Generator Implant Date: 20231215

## 2023-11-15 ENCOUNTER — Ambulatory Visit: Payer: Self-pay | Admitting: Cardiovascular Disease

## 2023-11-21 DIAGNOSIS — M810 Age-related osteoporosis without current pathological fracture: Secondary | ICD-10-CM | POA: Diagnosis not present

## 2023-11-21 DIAGNOSIS — I2510 Atherosclerotic heart disease of native coronary artery without angina pectoris: Secondary | ICD-10-CM | POA: Diagnosis not present

## 2023-11-21 DIAGNOSIS — I10 Essential (primary) hypertension: Secondary | ICD-10-CM | POA: Diagnosis not present

## 2023-11-21 DIAGNOSIS — I639 Cerebral infarction, unspecified: Secondary | ICD-10-CM | POA: Diagnosis not present

## 2023-11-21 DIAGNOSIS — F3341 Major depressive disorder, recurrent, in partial remission: Secondary | ICD-10-CM | POA: Diagnosis not present

## 2023-11-21 DIAGNOSIS — I350 Nonrheumatic aortic (valve) stenosis: Secondary | ICD-10-CM | POA: Diagnosis not present

## 2023-12-09 DIAGNOSIS — S72142G Displaced intertrochanteric fracture of left femur, subsequent encounter for closed fracture with delayed healing: Secondary | ICD-10-CM | POA: Diagnosis not present

## 2023-12-09 DIAGNOSIS — Z5189 Encounter for other specified aftercare: Secondary | ICD-10-CM | POA: Diagnosis not present

## 2023-12-13 ENCOUNTER — Ambulatory Visit (INDEPENDENT_AMBULATORY_CARE_PROVIDER_SITE_OTHER)

## 2023-12-13 DIAGNOSIS — I639 Cerebral infarction, unspecified: Secondary | ICD-10-CM | POA: Diagnosis not present

## 2023-12-13 LAB — CUP PACEART REMOTE DEVICE CHECK
Date Time Interrogation Session: 20250921232443
Implantable Pulse Generator Implant Date: 20231215

## 2023-12-14 NOTE — Progress Notes (Signed)
 Remote Loop Recorder Transmission

## 2023-12-21 DIAGNOSIS — I350 Nonrheumatic aortic (valve) stenosis: Secondary | ICD-10-CM | POA: Diagnosis not present

## 2023-12-21 DIAGNOSIS — M810 Age-related osteoporosis without current pathological fracture: Secondary | ICD-10-CM | POA: Diagnosis not present

## 2023-12-21 DIAGNOSIS — I10 Essential (primary) hypertension: Secondary | ICD-10-CM | POA: Diagnosis not present

## 2023-12-21 DIAGNOSIS — F3341 Major depressive disorder, recurrent, in partial remission: Secondary | ICD-10-CM | POA: Diagnosis not present

## 2023-12-21 DIAGNOSIS — I639 Cerebral infarction, unspecified: Secondary | ICD-10-CM | POA: Diagnosis not present

## 2023-12-21 DIAGNOSIS — I2510 Atherosclerotic heart disease of native coronary artery without angina pectoris: Secondary | ICD-10-CM | POA: Diagnosis not present

## 2023-12-24 ENCOUNTER — Encounter

## 2023-12-24 DIAGNOSIS — S72142G Displaced intertrochanteric fracture of left femur, subsequent encounter for closed fracture with delayed healing: Secondary | ICD-10-CM | POA: Diagnosis not present

## 2023-12-27 NOTE — Progress Notes (Signed)
 Remote Loop Recorder Transmission

## 2023-12-28 ENCOUNTER — Ambulatory Visit: Payer: Self-pay | Admitting: Cardiovascular Disease

## 2023-12-29 DIAGNOSIS — I10 Essential (primary) hypertension: Secondary | ICD-10-CM | POA: Diagnosis not present

## 2024-01-13 ENCOUNTER — Ambulatory Visit

## 2024-01-13 ENCOUNTER — Encounter

## 2024-01-13 DIAGNOSIS — I2510 Atherosclerotic heart disease of native coronary artery without angina pectoris: Secondary | ICD-10-CM | POA: Diagnosis not present

## 2024-01-13 DIAGNOSIS — I639 Cerebral infarction, unspecified: Secondary | ICD-10-CM

## 2024-01-13 DIAGNOSIS — I350 Nonrheumatic aortic (valve) stenosis: Secondary | ICD-10-CM | POA: Diagnosis not present

## 2024-01-13 DIAGNOSIS — I10 Essential (primary) hypertension: Secondary | ICD-10-CM | POA: Diagnosis not present

## 2024-01-13 LAB — CUP PACEART REMOTE DEVICE CHECK
Date Time Interrogation Session: 20251022232007
Implantable Pulse Generator Implant Date: 20231215

## 2024-01-14 NOTE — Progress Notes (Signed)
 Remote Loop Recorder Transmission

## 2024-01-21 DIAGNOSIS — F3341 Major depressive disorder, recurrent, in partial remission: Secondary | ICD-10-CM | POA: Diagnosis not present

## 2024-01-21 DIAGNOSIS — M810 Age-related osteoporosis without current pathological fracture: Secondary | ICD-10-CM | POA: Diagnosis not present

## 2024-01-21 DIAGNOSIS — I350 Nonrheumatic aortic (valve) stenosis: Secondary | ICD-10-CM | POA: Diagnosis not present

## 2024-01-21 DIAGNOSIS — I639 Cerebral infarction, unspecified: Secondary | ICD-10-CM | POA: Diagnosis not present

## 2024-01-24 ENCOUNTER — Encounter

## 2024-01-26 ENCOUNTER — Ambulatory Visit: Payer: Self-pay | Admitting: Cardiovascular Disease

## 2024-02-11 DIAGNOSIS — S72142G Displaced intertrochanteric fracture of left femur, subsequent encounter for closed fracture with delayed healing: Secondary | ICD-10-CM | POA: Diagnosis not present

## 2024-02-12 DIAGNOSIS — I10 Essential (primary) hypertension: Secondary | ICD-10-CM | POA: Diagnosis not present

## 2024-02-12 DIAGNOSIS — I350 Nonrheumatic aortic (valve) stenosis: Secondary | ICD-10-CM | POA: Diagnosis not present

## 2024-02-12 DIAGNOSIS — I2510 Atherosclerotic heart disease of native coronary artery without angina pectoris: Secondary | ICD-10-CM | POA: Diagnosis not present

## 2024-02-12 DIAGNOSIS — I639 Cerebral infarction, unspecified: Secondary | ICD-10-CM | POA: Diagnosis not present

## 2024-02-13 ENCOUNTER — Ambulatory Visit

## 2024-02-14 ENCOUNTER — Encounter

## 2024-02-15 ENCOUNTER — Ambulatory Visit

## 2024-02-15 DIAGNOSIS — I639 Cerebral infarction, unspecified: Secondary | ICD-10-CM | POA: Diagnosis not present

## 2024-02-15 LAB — CUP PACEART REMOTE DEVICE CHECK
Date Time Interrogation Session: 20251124232350
Implantable Pulse Generator Implant Date: 20231215

## 2024-02-16 NOTE — Progress Notes (Signed)
 Remote Loop Recorder Transmission

## 2024-02-20 DIAGNOSIS — I2510 Atherosclerotic heart disease of native coronary artery without angina pectoris: Secondary | ICD-10-CM | POA: Diagnosis not present

## 2024-02-20 DIAGNOSIS — I350 Nonrheumatic aortic (valve) stenosis: Secondary | ICD-10-CM | POA: Diagnosis not present

## 2024-02-20 DIAGNOSIS — I639 Cerebral infarction, unspecified: Secondary | ICD-10-CM | POA: Diagnosis not present

## 2024-02-20 DIAGNOSIS — M810 Age-related osteoporosis without current pathological fracture: Secondary | ICD-10-CM | POA: Diagnosis not present

## 2024-02-20 DIAGNOSIS — I10 Essential (primary) hypertension: Secondary | ICD-10-CM | POA: Diagnosis not present

## 2024-02-20 DIAGNOSIS — F3341 Major depressive disorder, recurrent, in partial remission: Secondary | ICD-10-CM | POA: Diagnosis not present

## 2024-02-22 ENCOUNTER — Ambulatory Visit: Payer: Self-pay | Admitting: Cardiovascular Disease

## 2024-02-24 ENCOUNTER — Encounter

## 2024-03-17 ENCOUNTER — Ambulatory Visit: Attending: Cardiovascular Disease

## 2024-03-17 DIAGNOSIS — I639 Cerebral infarction, unspecified: Secondary | ICD-10-CM

## 2024-03-17 LAB — CUP PACEART REMOTE DEVICE CHECK
Date Time Interrogation Session: 20251225232049
Implantable Pulse Generator Implant Date: 20231215

## 2024-03-22 ENCOUNTER — Ambulatory Visit: Payer: Self-pay | Admitting: Cardiovascular Disease

## 2024-03-22 NOTE — Progress Notes (Signed)
 Remote Loop Recorder Transmission

## 2024-03-27 ENCOUNTER — Encounter

## 2024-04-17 ENCOUNTER — Ambulatory Visit: Attending: Cardiovascular Disease

## 2024-04-17 DIAGNOSIS — I639 Cerebral infarction, unspecified: Secondary | ICD-10-CM

## 2024-04-17 LAB — CUP PACEART REMOTE DEVICE CHECK
Date Time Interrogation Session: 20260125232434
Implantable Pulse Generator Implant Date: 20231215

## 2024-04-18 ENCOUNTER — Ambulatory Visit: Payer: Self-pay | Admitting: Cardiovascular Disease

## 2024-04-25 NOTE — Progress Notes (Signed)
 Remote Loop Recorder Transmission

## 2024-04-27 ENCOUNTER — Encounter

## 2024-05-18 ENCOUNTER — Ambulatory Visit
